# Patient Record
Sex: Male | Born: 1990 | Race: Black or African American | Hispanic: No | Marital: Single | Smoking: Current every day smoker
Health system: Southern US, Community
[De-identification: ages and names within clinical notes are randomized; demographics above are authoritative.]

## PROBLEM LIST (undated history)

## (undated) DIAGNOSIS — A4902 Methicillin resistant Staphylococcus aureus infection, unspecified site: Secondary | ICD-10-CM

## (undated) DIAGNOSIS — F1910 Other psychoactive substance abuse, uncomplicated: Secondary | ICD-10-CM

## (undated) DIAGNOSIS — S31119A Laceration without foreign body of abdominal wall, unspecified quadrant without penetration into peritoneal cavity, initial encounter: Secondary | ICD-10-CM

## (undated) HISTORY — PX: ABDOMINAL SURGERY: SHX537

---

## 2019-11-17 ENCOUNTER — Encounter (HOSPITAL_COMMUNITY): Payer: Self-pay | Admitting: Emergency Medicine

## 2019-11-17 ENCOUNTER — Inpatient Hospital Stay (HOSPITAL_COMMUNITY): Payer: Self-pay

## 2019-11-17 ENCOUNTER — Inpatient Hospital Stay (HOSPITAL_COMMUNITY)
Admission: EM | Admit: 2019-11-17 | Discharge: 2019-11-25 | DRG: 872 | Disposition: A | Discharge status: Home / Self Care | Payer: Self-pay | Attending: Internal Medicine | Admitting: Internal Medicine

## 2019-11-17 ENCOUNTER — Emergency Department (HOSPITAL_COMMUNITY): Payer: Self-pay

## 2019-11-17 ENCOUNTER — Other Ambulatory Visit: Payer: Self-pay

## 2019-11-17 DIAGNOSIS — F17200 Nicotine dependence, unspecified, uncomplicated: Secondary | ICD-10-CM | POA: Diagnosis present

## 2019-11-17 DIAGNOSIS — E871 Hypo-osmolality and hyponatremia: Secondary | ICD-10-CM | POA: Diagnosis present

## 2019-11-17 DIAGNOSIS — Z20822 Contact with and (suspected) exposure to covid-19: Secondary | ICD-10-CM | POA: Diagnosis present

## 2019-11-17 DIAGNOSIS — M5431 Sciatica, right side: Secondary | ICD-10-CM | POA: Diagnosis present

## 2019-11-17 DIAGNOSIS — M5432 Sciatica, left side: Secondary | ICD-10-CM

## 2019-11-17 DIAGNOSIS — M5442 Lumbago with sciatica, left side: Secondary | ICD-10-CM

## 2019-11-17 DIAGNOSIS — R079 Chest pain, unspecified: Secondary | ICD-10-CM

## 2019-11-17 DIAGNOSIS — Z720 Tobacco use: Secondary | ICD-10-CM

## 2019-11-17 DIAGNOSIS — E876 Hypokalemia: Secondary | ICD-10-CM | POA: Diagnosis present

## 2019-11-17 DIAGNOSIS — E872 Acidosis, unspecified: Secondary | ICD-10-CM

## 2019-11-17 DIAGNOSIS — A419 Sepsis, unspecified organism: Secondary | ICD-10-CM

## 2019-11-17 DIAGNOSIS — R7881 Bacteremia: Secondary | ICD-10-CM

## 2019-11-17 DIAGNOSIS — F419 Anxiety disorder, unspecified: Secondary | ICD-10-CM | POA: Diagnosis present

## 2019-11-17 DIAGNOSIS — F1910 Other psychoactive substance abuse, uncomplicated: Secondary | ICD-10-CM

## 2019-11-17 DIAGNOSIS — A4102 Sepsis due to Methicillin resistant Staphylococcus aureus: Principal | ICD-10-CM | POA: Diagnosis present

## 2019-11-17 HISTORY — DX: Laceration without foreign body of abdominal wall, unspecified quadrant without penetration into peritoneal cavity, initial encounter: S31.119A

## 2019-11-17 LAB — CBC WITH DIFFERENTIAL/PLATELET
Abs Immature Granulocytes: 0.12 10*3/uL — ABNORMAL HIGH (ref 0.00–0.07)
Basophils Absolute: 0 10*3/uL (ref 0.0–0.1)
Basophils Relative: 0 %
Eosinophils Absolute: 0.2 10*3/uL (ref 0.0–0.5)
Eosinophils Relative: 1 %
HCT: 34.5 % — ABNORMAL LOW (ref 39.0–52.0)
Hemoglobin: 11.5 g/dL — ABNORMAL LOW (ref 13.0–17.0)
Immature Granulocytes: 1 %
Lymphocytes Relative: 6 %
Lymphs Abs: 1.1 10*3/uL (ref 0.7–4.0)
MCH: 27.4 pg (ref 26.0–34.0)
MCHC: 33.3 g/dL (ref 30.0–36.0)
MCV: 82.1 fL (ref 80.0–100.0)
Monocytes Absolute: 1.1 10*3/uL — ABNORMAL HIGH (ref 0.1–1.0)
Monocytes Relative: 6 %
Neutro Abs: 16.3 10*3/uL — ABNORMAL HIGH (ref 1.7–7.7)
Neutrophils Relative %: 86 %
Platelets: 235 10*3/uL (ref 150–400)
RBC: 4.2 MIL/uL — ABNORMAL LOW (ref 4.22–5.81)
RDW: 13.6 % (ref 11.5–15.5)
WBC: 18.9 10*3/uL — ABNORMAL HIGH (ref 4.0–10.5)
nRBC: 0 % (ref 0.0–0.2)

## 2019-11-17 LAB — COMPREHENSIVE METABOLIC PANEL
ALT: 12 U/L (ref 0–44)
AST: 21 U/L (ref 15–41)
Albumin: 3.7 g/dL (ref 3.5–5.0)
Alkaline Phosphatase: 107 U/L (ref 38–126)
Anion gap: 11 (ref 5–15)
BUN: 10 mg/dL (ref 6–20)
CO2: 22 mmol/L (ref 22–32)
Calcium: 8.8 mg/dL — ABNORMAL LOW (ref 8.9–10.3)
Chloride: 98 mmol/L (ref 98–111)
Creatinine, Ser: 1.1 mg/dL (ref 0.61–1.24)
GFR calc Af Amer: >60 mL/min (ref >60)
GFR calc non Af Amer: >60 mL/min (ref >60)
Glucose, Bld: 114 mg/dL — ABNORMAL HIGH (ref 70–99)
Potassium: 3.2 mmol/L — ABNORMAL LOW (ref 3.5–5.1)
Sodium: 131 mmol/L — ABNORMAL LOW (ref 135–145)
Total Bilirubin: 0.4 mg/dL (ref 0.3–1.2)
Total Protein: 7.6 g/dL (ref 6.5–8.1)

## 2019-11-17 LAB — BLOOD CULTURE ID PANEL (REFLEXED)

## 2019-11-17 LAB — PROTIME-INR
INR: 1.3 — ABNORMAL HIGH (ref 0.8–1.2)
Prothrombin Time: 15.3 seconds — ABNORMAL HIGH (ref 11.4–15.2)

## 2019-11-17 LAB — URINALYSIS, ROUTINE W REFLEX MICROSCOPIC
Bilirubin Urine: NEGATIVE
Glucose, UA: 50 mg/dL — AB
Hgb urine dipstick: NEGATIVE
Ketones, ur: NEGATIVE mg/dL
Leukocytes,Ua: NEGATIVE
Nitrite: NEGATIVE
Protein, ur: NEGATIVE mg/dL
Specific Gravity, Urine: 1.026 (ref 1.005–1.030)
pH: 6 (ref 5.0–8.0)

## 2019-11-17 LAB — SEDIMENTATION RATE: Sed Rate: 40 mm/hr — ABNORMAL HIGH (ref 0–16)

## 2019-11-17 LAB — APTT: aPTT: 27 seconds (ref 24–36)

## 2019-11-17 LAB — LACTIC ACID, PLASMA
Lactic Acid, Venous: 2.7 mmol/L (ref 0.5–1.9)
Lactic Acid, Venous: 1.7 mmol/L (ref 0.5–1.9)

## 2019-11-17 LAB — C-REACTIVE PROTEIN: CRP: 13.8 mg/dL — ABNORMAL HIGH (ref <1.0)

## 2019-11-17 LAB — HIV ANTIBODY (ROUTINE TESTING W REFLEX): HIV Screen 4th Generation wRfx: NONREACTIVE

## 2019-11-17 LAB — SARS CORONAVIRUS 2 BY RT PCR (HOSPITAL ORDER, PERFORMED IN ~~LOC~~ HOSPITAL LAB): SARS Coronavirus 2: NEGATIVE

## 2019-11-17 MED ORDER — IBUPROFEN 800 MG PO TABS
800.0000 mg | ORAL_TABLET | Freq: Once | ORAL | Status: AC
  Administered 2019-11-17: 800 mg via ORAL

## 2019-11-17 MED ORDER — METHOCARBAMOL 1000 MG/10ML IJ SOLN
1000.0000 mg | Freq: Once | INTRAVENOUS | Status: AC
  Administered 2019-11-17: 1000 mg via INTRAVENOUS
  Filled 2019-11-17: qty 10

## 2019-11-17 MED ORDER — SODIUM CHLORIDE 0.9 % IV SOLN
2.0000 g | Freq: Once | INTRAVENOUS | Status: AC
  Administered 2019-11-17: 2 g via INTRAVENOUS
  Filled 2019-11-17: qty 2

## 2019-11-17 MED ORDER — ACETAMINOPHEN 650 MG RE SUPP: 650.0000 mg | Freq: Four times a day (QID) | RECTAL | Status: DC | PRN

## 2019-11-17 MED ORDER — METHOCARBAMOL 500 MG PO TABS
500.0000 mg | ORAL_TABLET | Freq: Four times a day (QID) | ORAL | Status: DC | PRN
  Administered 2019-11-17 – 2019-11-25 (×14): 500 mg via ORAL
  Filled 2019-11-17 (×16): qty 1

## 2019-11-17 MED ORDER — ACETAMINOPHEN 325 MG PO TABS
650.0000 mg | ORAL_TABLET | Freq: Once | ORAL | Status: AC
  Administered 2019-11-17: 650 mg via ORAL
  Filled 2019-11-17: qty 2

## 2019-11-17 MED ORDER — ACETAMINOPHEN 325 MG PO TABS
650.0000 mg | ORAL_TABLET | Freq: Four times a day (QID) | ORAL | Status: DC | PRN
  Administered 2019-11-17 – 2019-11-20 (×7): 650 mg via ORAL
  Filled 2019-11-17 (×7): qty 2

## 2019-11-17 MED ORDER — SODIUM CHLORIDE 0.9 % IV SOLN
2.0000 g | Freq: Three times a day (TID) | INTRAVENOUS | Status: DC
  Administered 2019-11-17: 2 g via INTRAVENOUS
  Filled 2019-11-17 (×2): qty 2

## 2019-11-17 MED ORDER — SODIUM CHLORIDE 0.9 % IV SOLN: INTRAVENOUS | Status: DC

## 2019-11-17 MED ORDER — VANCOMYCIN HCL IN DEXTROSE 1-5 GM/200ML-% IV SOLN: 1000.0000 mg | Freq: Once | INTRAVENOUS | Status: DC

## 2019-11-17 MED ORDER — LACTATED RINGERS IV BOLUS (SEPSIS)
1000.0000 mL | Freq: Once | INTRAVENOUS | Status: AC
  Administered 2019-11-17: 1000 mL via INTRAVENOUS

## 2019-11-17 MED ORDER — METHYLPREDNISOLONE SODIUM SUCC 125 MG IJ SOLR
125.0000 mg | Freq: Once | INTRAMUSCULAR | Status: AC
  Administered 2019-11-17: 125 mg via INTRAVENOUS
  Filled 2019-11-17: qty 2

## 2019-11-17 MED ORDER — METRONIDAZOLE IN NACL 5-0.79 MG/ML-% IV SOLN
500.0000 mg | Freq: Once | INTRAVENOUS | Status: AC
  Administered 2019-11-17: 500 mg via INTRAVENOUS
  Filled 2019-11-17: qty 100

## 2019-11-17 MED ORDER — KETOROLAC TROMETHAMINE 30 MG/ML IJ SOLN
30.0000 mg | Freq: Four times a day (QID) | INTRAMUSCULAR | Status: AC | PRN
  Administered 2019-11-17 – 2019-11-22 (×13): 30 mg via INTRAVENOUS
  Filled 2019-11-17 (×14): qty 1

## 2019-11-17 MED ORDER — IOHEXOL 300 MG/ML  SOLN
100.0000 mL | Freq: Once | INTRAMUSCULAR | Status: AC | PRN
  Administered 2019-11-17: 100 mL via INTRAVENOUS

## 2019-11-17 MED ORDER — POTASSIUM CHLORIDE CRYS ER 20 MEQ PO TBCR
40.0000 meq | EXTENDED_RELEASE_TABLET | Freq: Once | ORAL | Status: AC
  Administered 2019-11-17: 40 meq via ORAL
  Filled 2019-11-17: qty 2

## 2019-11-17 MED ORDER — LACTATED RINGERS IV BOLUS (SEPSIS)
500.0000 mL | Freq: Once | INTRAVENOUS | Status: AC
  Administered 2019-11-17: 500 mL via INTRAVENOUS

## 2019-11-17 MED ORDER — ENOXAPARIN SODIUM 40 MG/0.4ML ~~LOC~~ SOLN
40.0000 mg | SUBCUTANEOUS | Status: DC
  Administered 2019-11-17 – 2019-11-25 (×8): 40 mg via SUBCUTANEOUS
  Filled 2019-11-17 (×8): qty 0.4

## 2019-11-17 MED ORDER — VANCOMYCIN HCL 1500 MG/300ML IV SOLN
1500.0000 mg | Freq: Once | INTRAVENOUS | Status: AC
  Administered 2019-11-17: 1500 mg via INTRAVENOUS
  Filled 2019-11-17: qty 300

## 2019-11-17 MED ORDER — METHOCARBAMOL 1000 MG/10ML IJ SOLN
INTRAMUSCULAR | Status: AC
  Filled 2019-11-17: qty 10

## 2019-11-17 MED ORDER — VANCOMYCIN HCL IN DEXTROSE 1-5 GM/200ML-% IV SOLN
1000.0000 mg | Freq: Three times a day (TID) | INTRAVENOUS | Status: DC
  Administered 2019-11-17 – 2019-11-20 (×9): 1000 mg via INTRAVENOUS
  Filled 2019-11-17 (×10): qty 200
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
## 2019-11-17 NOTE — ED Notes (Addendum)
Charis, OT, states she was unable to complete full assessment on pt due to pt lethargy at this time. States pt will be reevaluated inpatient.

## 2019-11-17 NOTE — ED Notes (Signed)
Pt ambulating independently upon entering room.

## 2019-11-17 NOTE — ED Notes (Signed)
Date and time results received: 11/17/19 4:14 PM  (use smartphrase ".now" to insert current time)  Test: gram positive cocci blood cultures Critical Value: positive   Name of Provider Notified: Dyann Kief MD  Orders Received? Or Actions Taken?: na

## 2019-11-17 NOTE — Progress Notes (Signed)
Pharmacy Antibiotic Note  Jordan [name] is a 29 y.o. male admitted on 11/17/2019 with sepsis.  Pharmacy has been consulted for Vancomycin and Cefepime dosing.  Plan: Cefepime 2gm IV q8h Vancomycin 1500mg  IV now then  1000 mg IV Q 8 hrs. Will f/u renal function, micro data, and pt's clinical condition Vanc levels prn   Height: 5\' 10"  (177.8 cm) Weight: 77.1 kg (170 lb) IBW/kg (Calculated) : 73  Temp (24hrs), Avg:100.8 F (38.2 C), Min:98.8 F (37.1 C), Max:102.7 F (39.3 C)  No results for input(s): WBC, CREATININE, LATICACIDVEN, VANCOTROUGH, VANCOPEAK, VANCORANDOM, GENTTROUGH, GENTPEAK, GENTRANDOM, TOBRATROUGH, TOBRAPEAK, TOBRARND, AMIKACINPEAK, AMIKACINTROU, AMIKACIN in the last 168 hours.  CrCl cannot be calculated (No successful lab value found.).    Not on File  Antimicrobials this admission: 7/19 Vanc >>  7/19 Cefepime >>  7/19 Flagyl x 1  Microbiology results:  BCx:   UCx:     Thank you for allowing pharmacy to be a part of this patient's care.  Sherlon Handing, PharmD, BCPS Please see amion for complete clinical pharmacist phone list 11/17/2019 3:59 AM

## 2019-11-17 NOTE — ED Notes (Signed)
Pt back from CT

## 2019-11-17 NOTE — Progress Notes (Signed)
Patient seen and examined.  Admitted after midnight secondary to progressive and debilitating lower back pain with sciatica; fever, tachycardia, elevated lactic acid and WBCs, with concerns for for sepsis.  Patient with underlying history of IV recreational drugs usage (heroin).  Please refer to H&P written by Dr. Josephine Cables on 11/17/2019 for further info/details on admission.   Plan: -Unable to pursued MRI evaluation due to presence of ankle bracelet. -CT thoracic and lumbar spine with contrast has been ordered to assist with evaluation of SIRS/sepsis. -For now continue fluid resuscitation and empiric antibiotics -Continue as needed analgesics -Follow clinical response.  Barton Dubois MD [phone]

## 2019-11-17 NOTE — Progress Notes (Signed)
Or  29 yo w IVDU hx MRSA bacteremia   Narrow to vancomycin repeat blood cultures no central Lines  TTE for starters

## 2019-11-17 NOTE — ED Notes (Signed)
Pt has black ankle bracelet on left leg. MRI notified of device.

## 2019-11-17 NOTE — ED Triage Notes (Signed)
Pt arrives via [facility]. Pt presents with C/O left leg pain that starts at his buttock and runs behind his knee.

## 2019-11-17 NOTE — ED Notes (Signed)
Pt states he is unable to be transported to MRI in wheelchair due to sciatic pain. Pt mother called out due to patient difficulty getting into bed. 2 person assist from standing position to bed required for pt. Pt states he is unable to bear weight on left leg due to pain.

## 2019-11-17 NOTE — Evaluation (Signed)
Occupational Therapy Evaluation Patient Details Name: Briana Newman MRN: [id number] DOB: [birth year] Today's Date: 11/17/2019    History of Present Illness 29 yo male presenting to ED with severe burning pain in the left buttock with radiation to the left foot. Pt reports difficulty weight bearing on left leg due to severity of pain. Past medical history significant for tobacco abuse and IVDU.    Clinical Impression   PTA, pt was independent, working third shift, and living with his mother. Pt currently presenting with lethargy and drowsiness; difficulty waking from sleep to perform full assessment. During PROM of LLE, pt tolerating ROM at ankle and knee; waking with partial hip ROM but then falling back asleep. Max A for repositioning in bed as pt sleeping with LLE hanging over bed. Mom present and providing PLOF and home set up. Educating mom on possible need for 3N1 at DC to use as toilet riser and shower seat. Pt would benefit from further acute OT to further assess functional performance and provide education for compensatory techniques to decreased pain with ADLs. Recommend dc to home once medically stable per physician.     Follow Up Recommendations  No OT follow up    Equipment Recommendations  3 in 1 bedside commode    Recommendations for Other Services PT consult     Precautions / Restrictions Precautions Precautions: Fall Restrictions Other Position/Activity Restrictions: Pt maintaining NWB at [place] to pain      Mobility Bed Mobility Overal bed mobility: Needs Assistance             General bed mobility comments: Max A to reposition and place LLE onto bed.   Transfers                      Balance                                           ADL either performed or assessed with clinical judgement   ADL Overall ADL's : Needs assistance/impaired                                       General ADL Comments: Pt currently  declining to perform ADLs because of fatigue and drowsiness. Mom reporting that he is in so much pain that he is unable to get dressed or bath. Reports patient wants to bath but unable to participate in task with pain. Mom also reporting pt used her rollator at home to decrease weight bearing at LLE. Educating mom on possible need for 3N1 over toilet (to elevate) and as shower seat. Mom verbalizing understanding.     Vision Baseline Vision/History: No visual deficits       Perception     Praxis      Pertinent Vitals/Pain Pain Assessment: Faces Faces Pain Scale: Hurts little more Pain Location: With movement of LLE Pain Descriptors / Indicators: Grimacing;Discomfort Pain Intervention(s): Monitored during session;Repositioned     Hand Dominance Right   Extremity/Trunk Assessment Upper Extremity Assessment Upper Extremity Assessment: Overall WFL for tasks assessed   Lower Extremity Assessment Lower Extremity Assessment: Defer to PT evaluation   Cervical / Trunk Assessment Cervical / Trunk Assessment: Normal   Communication Communication Communication: No difficulties   Cognition Arousal/Alertness: Lethargic Behavior During Therapy: Flat affect Overall Cognitive  Status: Difficult to assess                                 General Comments: Pt sleeping upon arrival, Waking with movement of LLE but continues to be lethargic. Opening his eyes at one point and saying "hey" and thne "im sorry" in response to his fatgieu and drowsiness   General Comments  Mom present during session.    Exercises Exercises: General Lower Extremity General Exercises - Lower Extremity Ankle Circles/Pumps: PROM;5 reps;Left   Shoulder Instructions      Home Living Family/patient expects to be discharged to:: Private residence Living Arrangements: Parent Available Help at Discharge: Family;Available PRN/intermittently Type of Home: House Home Access: Stairs to enter [employer] of Steps: 1 Entrance Stairs-Rails: None Home Layout: One level     Bathroom Shower/Tub: Tub/shower unit;Curtain   Biochemist, clinical: Standard     Home Equipment: Environmental consultant - 4 wheels (Rollator)          Prior Functioning/Environment Level of Independence: Independent        Comments: Works third shift        OT Problem List: Decreased strength;Decreased range of motion;Decreased activity tolerance;Impaired balance (sitting and/or standing);Decreased knowledge of use of DME or AE;Decreased knowledge of precautions;Pain      OT Treatment/Interventions: Self-care/ADL training;Therapeutic exercise;Energy conservation;DME and/or AE instruction;Therapeutic activities;Patient/family education    OT Goals(Current goals can be found in the care plan section) Acute Rehab OT Goals Patient Stated Goal: Mom "decrease pain and walk again" OT Goal Formulation: With patient/family Time For Goal Achievement: 12/01/19 Potential to Achieve Goals: Good  OT Frequency: Min 2X/week   Barriers to D/C:            Co-evaluation              AM-PAC OT "6 Clicks" Daily Activity     Outcome Measure Help from another person eating meals?: A Little Help from another person taking care of personal grooming?: A Little Help from another person toileting, which includes using toliet, bedpan, or urinal?: A Lot Help from another person bathing (including washing, rinsing, drying)?: A Lot Help from another person to put on and taking off regular upper body clothing?: A Little Help from another person to put on and taking off regular lower body clothing?: A Lot 6 Click Score: 15   End of Session Nurse Communication: Mobility status  Activity Tolerance: Patient limited by fatigue;Patient limited by pain;Patient limited by lethargy Patient left: in bed;with call bell/phone within reach;with family/visitor present  OT Visit Diagnosis: Unsteadiness on feet (R26.81);Other abnormalities  of gait and mobility (R26.89);Muscle weakness (generalized) (M62.81);Pain Pain - Right/Left: Left Pain - part of body: Leg                Time: 3785-8850 OT Time Calculation (min): 15 min Charges:  OT General Charges $OT Visit: 1 Visit OT Evaluation $OT Eval Moderate Complexity: Leonard, OTR/L Acute Rehab Pager: [phone] Office: [place] 11/17/2019, 10:35 AM

## 2019-11-17 NOTE — ED Notes (Signed)
Date and time results received: 11/17/19  0508  Test: Lactic Acid Critical Value: 2.7  Name of Provider Notified: Dr. Roxanne Mins  Orders Received? Or Actions Taken?: Actions Taken: n/a

## 2019-11-17 NOTE — ED Notes (Signed)
Pt is requesting something for pain. Pt states the tylenol is not helping. RN notified.

## 2019-11-17 NOTE — Progress Notes (Signed)
CRITICAL VALUE ALERT  Critical Value: Methicillin-resistant , + staph aureus  Date & Time Notied: 2316  Provider Notified: Lang Snow, NP  Orders Received/Actions taken: Aware, pt. Is on vancomyacin.

## 2019-11-18 ENCOUNTER — Encounter (HOSPITAL_COMMUNITY): Payer: Self-pay | Admitting: Internal Medicine

## 2019-11-18 ENCOUNTER — Inpatient Hospital Stay (HOSPITAL_COMMUNITY): Payer: Self-pay

## 2019-11-18 DIAGNOSIS — R7881 Bacteremia: Secondary | ICD-10-CM

## 2019-11-18 DIAGNOSIS — A4102 Sepsis due to Methicillin resistant Staphylococcus aureus: Principal | ICD-10-CM

## 2019-11-18 DIAGNOSIS — B9562 Methicillin resistant Staphylococcus aureus infection as the cause of diseases classified elsewhere: Secondary | ICD-10-CM

## 2019-11-18 LAB — COMPREHENSIVE METABOLIC PANEL
ALT: 14 U/L (ref 0–44)
AST: 16 U/L (ref 15–41)
Albumin: 3.4 g/dL — ABNORMAL LOW (ref 3.5–5.0)
Alkaline Phosphatase: 99 U/L (ref 38–126)
Anion gap: 12 (ref 5–15)
BUN: 11 mg/dL (ref 6–20)
CO2: 24 mmol/L (ref 22–32)
Calcium: 9.1 mg/dL (ref 8.9–10.3)
Chloride: 103 mmol/L (ref 98–111)
Creatinine, Ser: 0.83 mg/dL (ref 0.61–1.24)
GFR calc Af Amer: >60 mL/min (ref >60)
GFR calc non Af Amer: >60 mL/min (ref >60)
Glucose, Bld: 102 mg/dL — ABNORMAL HIGH (ref 70–99)
Potassium: 3.6 mmol/L (ref 3.5–5.1)
Sodium: 139 mmol/L (ref 135–145)
Total Bilirubin: 0.5 mg/dL (ref 0.3–1.2)
Total Protein: 7.5 g/dL (ref 6.5–8.1)

## 2019-11-18 LAB — CBC
HCT: 35.2 % — ABNORMAL LOW (ref 39.0–52.0)
Hemoglobin: 11.2 g/dL — ABNORMAL LOW (ref 13.0–17.0)
MCH: 26.9 pg (ref 26.0–34.0)
MCHC: 31.8 g/dL (ref 30.0–36.0)
MCV: 84.4 fL (ref 80.0–100.0)
Platelets: 258 10*3/uL (ref 150–400)
RBC: 4.17 MIL/uL — ABNORMAL LOW (ref 4.22–5.81)
RDW: 13.7 % (ref 11.5–15.5)
WBC: 26.4 10*3/uL — ABNORMAL HIGH (ref 4.0–10.5)
nRBC: 0 % (ref 0.0–0.2)

## 2019-11-18 LAB — LACTIC ACID, PLASMA
Lactic Acid, Venous: 2.1 mmol/L (ref 0.5–1.9)
Lactic Acid, Venous: 0.8 mmol/L (ref 0.5–1.9)

## 2019-11-18 LAB — PROTIME-INR
INR: 1.2 (ref 0.8–1.2)
Prothrombin Time: 14.6 seconds (ref 11.4–15.2)

## 2019-11-18 LAB — HEPATITIS PANEL, ACUTE
HCV Ab: NONREACTIVE
Hep A IgM: NONREACTIVE
Hep B C IgM: NONREACTIVE
Hepatitis B Surface Ag: NONREACTIVE

## 2019-11-18 LAB — PHOSPHORUS: Phosphorus: 2.6 mg/dL (ref 2.5–4.6)

## 2019-11-18 LAB — ECHOCARDIOGRAM COMPLETE
AR max vel: 3.42 cm2
AV Area VTI: 3.46 cm2
AV Area mean vel: 3.13 cm2
AV Mean grad: 5.2 mmHg
AV Peak grad: 10.1 mmHg
Ao pk vel: 1.59 m/s
Area-P 1/2: 5.06 cm2
Height: 70 in
S' Lateral: 3.74 cm
Weight: 2839.52 oz

## 2019-11-18 LAB — MAGNESIUM: Magnesium: 2.3 mg/dL (ref 1.7–2.4)

## 2019-11-18 LAB — APTT: aPTT: 27 seconds (ref 24–36)

## 2019-11-18 MED ORDER — TEMAZEPAM 7.5 MG PO CAPS
7.5000 mg | ORAL_CAPSULE | Freq: Every evening | ORAL | Status: DC | PRN
  Administered 2019-11-18 – 2019-11-24 (×5): 7.5 mg via ORAL
  Filled 2019-11-18 (×6): qty 1

## 2019-11-18 NOTE — Progress Notes (Signed)
PROGRESS NOTE    Jordan [name]  EVO:[id number] DOB: 1991-01-21 DOA: 11/17/2019 PCP: Patient, No Pcp Per   Chief Complaint  Patient presents with  . Back Pain    Brief Narrative:  As per H&P written by Dr. Josephine Cables on 11/17/2019 Jordan [name] is a 29 y.o. male with medical history significant for tobacco abuse and IVDU who presents to the emergency department due to severe burning pain in the left buttock with radiation to the left foot which started around 8 PM yesterday.  Pain was rated as 10/10 on pain scale.  He complained of difficulty in being able to bear weight on the leg due to severity of the pain, however he denies numbness, tingling, bowel or bladder incontinence.  Patient states that the only position of comfort was kneeling on the floor with his elbows on the bed, however, on arrival at the ED, he was on his feet, though most of his weight was on his right leg.  Last IV heroin use was 3-4 days ago and he injects into antecubital area.  He denies chest pain, shortness of breath, fever, nausea, vomiting or abdominal pain.  ED Course: In the emergency department, initial temperature was normal at 98.51F, but this eventually increased to 127F, he was tachycardic.  Work-up in the ED showed leukocytosis, sodium 131, potassium 3.2, lactic acid 2.7, SARS coronavirus 2 negative.  Chest x-ray showed no active disease.  MRI cervical, thoracic and lumbar spine ordered and pending.  Code sepsis was initiated with IV LR per sepsis protocol and IV Vanco and cefepime ordered.  IV Solu-Medrol 25 Mg x1, ibuprofen and Robaxin were given.  Hospitalist was asked to admit.  For further evaluation and management.  Assessment & Plan: 1-sepsis [facility]) present on admission: In the setting of MRSA bacteremia -Patient with positive history of IV drug use -On presentation was febrile, tachycardic, with elevated WBCs, elevated lactic acid -Lactic acid now resolved after fluid resuscitation -Currently receiving IV  vancomycin -Echo ordered/pending; no other source of bacterial infection appreciated currently. -Patient with ongoing lower back pain left-sided sciatica; with concern for deep tissue infection in his spine.  CT images without acute abnormalities.  Very low threshold based on progression of his symptoms to pursuit MRI images down the road. -appreciate assistance and recommendations by ID service, will follow recommendations.  2-Hypokalemia -Electrolytes have been repleted and currently stable -Will check magnesium level -Continue to follow trend.  3-Left sided sciatica -With acute lower back pain complaints -Currently avoiding the use of opiates -Continue Toradol and Robaxin.  4-hyponatremia/lactic acidosis -Resolved with fluid resuscitation -Advised to maintain adequate hydration -Continue to follow electrolytes trend. -Lactic acid within normal limits currently.  5-Tobacco abuse -Cessation counseling has been provided -Continue nicotine patch.  6-IV drug abuse ([place]) -Cessation counseling provided -Drug of choice is IV heroin -Opiate withdrawal score will be followed while inpatient. -If possible will minimize/avoid the use of narcotics.   DVT prophylaxis: lovenox.  Code Status: Full code. Family Communication: No family at bedside currently. Disposition:   Status is: Inpatient  Dispo: The patient is from: home              Anticipated d/c is to: home (once antibiotic therapy completed)              Anticipated d/c date is: to be determined; might required 4-5 weeks of IV antibiotics.               Patient currently not medically stable for discharge;  patient with active bacteremia secondary to MRSA and requiring IV antibiotic therapy.  Given underlying history of IV drug abuse my in the requiring to be hospitalized to complete monitorized antibiotic therapy.  He continued to express lower back pain, CT images demonstrating no acute abnormalities.  Dependent further  findings on echocardiogram and overall improvement in his status my in the requiring nothing more than just IV antibiotics.  If patient fails to improve or requires removal of ankle bracelet with subsequent MRI examination.       Consultants:   Infectious disease (Dr. Tommy Medal) recommendations given to pursuit echo evaluation and base on results might need TEE. Continue IV vancomycin.   Procedures:  See below for x-ray reports. 2D echo: Pending   Antimicrobials:  Vancomycin 11/17/19  Subjective: Complaining of back pain, no nausea, no vomiting, no chest pain.  Currently afebrile.  Reports no palpitations.  Objective: Vitals:   11/18/19 0520 11/18/19 0600 11/18/19 0900 11/18/19 1333  BP: (!) 156/75   (!) 152/77  Pulse: (!) 121 (!) 118  86  Resp: 16   18  Temp: 98.8 F (37.1 C)   99.5 F (37.5 C)  TempSrc: Oral   Oral  SpO2: 100%  98% 100%  Weight:      Height:        Intake/Output Summary (Last 24 hours) at 11/18/2019 1608 Last data filed at 11/18/2019 1500 Gross per 24 hour  Intake 2314.88 ml  Output 600 ml  Net 1714.88 ml   Filed Weights   11/17/19 0225 11/17/19 2110  Weight: 77.1 kg 80.5 kg    Examination:  General exam: Currently afebrile, reports no chest pain, no nausea, no vomiting.  Complaining of lower back pain and left-sided sciatica.  No shortness of breath. Respiratory system: Clear to auscultation. Respiratory effort normal. Cardiovascular system: Sinus tachycardia, no rubs, no gallops, no murmurs appreciated on exam.  There is no JVD. Gastrointestinal system: Abdomen is nondistended, soft and nontender. No organomegaly or masses felt. Normal bowel sounds heard. Central nervous system: Alert and oriented. No focal neurological deficits. Extremities: Muscle strength 4 out of 5 bilaterally (left more than right decreased appreciation) in the setting of poor effort and pain.  Patient reports no numbness sensation.  There is no cyanosis or clubbing. Skin:  No open wounds; no petechiae.  Multiple tattoos throughout his body appreciated on examination. Psychiatry: Judgement and insight appear normal. Mood & affect appropriate.     Data Reviewed: I have personally reviewed following labs and imaging studies  CBC: Recent Labs  Lab 11/17/19 0417 11/18/19 0500  WBC 18.9* 26.4*  NEUTROABS 16.3*  --   HGB 11.5* 11.2*  HCT 34.5* 35.2*  MCV 82.1 84.4  PLT 235 563    Basic Metabolic Panel: Recent Labs  Lab 11/17/19 0417 11/18/19 0500  NA 131* 139  K 3.2* 3.6  CL 98 103  CO2 22 24  GLUCOSE 114* 102*  BUN 10 11  CREATININE 1.10 0.83  CALCIUM 8.8* 9.1  MG  --  2.3  PHOS  --  2.6    GFR: Estimated Creatinine Clearance: 136.8 mL/min (by C-G formula based on SCr of 0.83 mg/dL).  Liver Function Tests: Recent Labs  Lab 11/17/19 0417 11/18/19 0500  AST 21 16  ALT 12 14  ALKPHOS 107 99  BILITOT 0.4 0.5  PROT 7.6 7.5  ALBUMIN 3.7 3.4*    CBG: No results for input(s): GLUCAP in the last 168 hours.   Recent Results (from  the past 240 hour(s))  SARS Coronavirus 2 by RT PCR (hospital order, performed in [facility] hospital lab) Nasopharyngeal Nasopharyngeal Swab     Status: None   Collection Time: 11/17/19  4:02 AM   Specimen: Nasopharyngeal Swab  Result Value Ref Range Status   SARS Coronavirus 2 NEGATIVE NEGATIVE Final    Comment: (NOTE) SARS-CoV-2 target nucleic acids are NOT DETECTED.  The SARS-CoV-2 RNA is generally detectable in upper and lower respiratory specimens during the acute phase of infection. The lowest concentration of SARS-CoV-2 viral copies this assay can detect is 250 copies / mL. A negative result does not preclude SARS-CoV-2 infection and should not be used as the sole basis for treatment or other patient management decisions.  A negative result may occur with improper specimen collection / handling, submission of specimen other than nasopharyngeal swab, presence of viral mutation(s) within  the areas targeted by this assay, and inadequate number of viral copies (<250 copies / mL). A negative result must be combined with clinical observations, patient history, and epidemiological information.  Fact Sheet for Patients:   StrictlyIdeas.no  Fact Sheet for Healthcare Providers: BankingDealers.co.za  This test is not yet approved or  cleared by the [place] FDA and has been authorized for detection and/or diagnosis of SARS-CoV-2 by FDA under an Emergency Use Authorization (EUA).  This EUA will remain in effect (meaning this test can be used) for the duration of the COVID-19 declaration under Section 564(b)(1) of the Act, 21 U.S.C. section 360bbb-3(b)(1), unless the authorization is terminated or revoked sooner.  Performed at [facility], [address], [place], [place] [address]   Blood Culture (routine x 2)     Status: Abnormal (Preliminary result)   Collection Time: 11/17/19  4:17 AM   Specimen: Right Antecubital; Blood  Result Value Ref Range Status   Specimen Description   Final    RIGHT ANTECUBITAL Performed at [facility], [address], [place], [place] [address]    Special Requests   Final    BOTTLES DRAWN AEROBIC AND ANAEROBIC Blood Culture adequate volume Performed at [facility], [address], [place], [place] [address]    Culture  Setup Time   Final    GRAM POSITIVE COCCI IN BOTH AEROBIC AND ANAEROBIC BOTTLES Gram Stain Report Called to,Read Back By and Verified With: KATYLNNELLIS,RN @1615  11/17/2019 KAY AEROBIC BOTTLE ALSO Performed at [facility], [place] [address] [place], [place] [address]    Culture STAPHYLOCOCCUS AUREUS (A)  Final   Report Status PENDING  Incomplete  Blood Culture (routine x 2)     Status: Abnormal (Preliminary result)   Collection Time: 11/17/19  4:20 AM   Specimen: BLOOD LEFT ARM  Result Value Ref Range Status   Specimen Description   Final    BLOOD LEFT  ARM Performed at [facility], [address] [place], [place] [address]    Special Requests   Final    BOTTLES DRAWN AEROBIC AND ANAEROBIC Blood Culture adequate volume Performed at [facility], [address], [place], [place] [address]    Culture  Setup Time   Final    GRAM POSITIVE COCCI IN BOTH AEROBIC AND ANAEROBIC BOTTLES Gram Stain Report Called to,Read Back By and Verified With: KATYLNN ELLIS,RN @1615   11/17/2019 KAY CRITICAL RESULT CALLED TO, READ BACK BY AND VERIFIED WITH: R DAWAY RN 2236 11/17/19 A BROWNING    Culture (A)  Final    STAPHYLOCOCCUS AUREUS SUSCEPTIBILITIES TO FOLLOW Performed at [facility], [place] [address], [place], [place] [address]    Report Status PENDING  Incomplete  Blood Culture ID Panel (Reflexed)     Status: Abnormal   Collection Time: 11/17/19  4:20 AM  Result Value Ref Range Status   Enterococcus species NOT DETECTED NOT DETECTED Final   Listeria monocytogenes NOT DETECTED NOT DETECTED Final   Staphylococcus species DETECTED (A) NOT DETECTED Final    Comment: CRITICAL RESULT CALLED TO, READ BACK BY AND VERIFIED WITH: R DAWAY RN 2236 11/17/19 A BROWNING    Staphylococcus aureus (BCID) DETECTED (A) NOT DETECTED Final    Comment: Methicillin (oxacillin)-resistant Staphylococcus aureus (MRSA). MRSA is predictably resistant to beta-lactam antibiotics (except ceftaroline). Preferred therapy is vancomycin unless clinically contraindicated. Patient requires contact precautions if  hospitalized. CRITICAL RESULT CALLED TO, READ BACK BY AND VERIFIED WITH: R DAWAY RN 2236 11/17/19 A BROWNING    Methicillin resistance DETECTED (A) NOT DETECTED Final    Comment: CRITICAL RESULT CALLED TO, READ BACK BY AND VERIFIED WITH: R DAWAY RN 2236 11/17/19 A BROWNING    Streptococcus species NOT DETECTED NOT DETECTED Final   Streptococcus agalactiae NOT DETECTED NOT DETECTED Final   Streptococcus pneumoniae NOT DETECTED NOT DETECTED Final   Streptococcus pyogenes  NOT DETECTED NOT DETECTED Final   Acinetobacter baumannii NOT DETECTED NOT DETECTED Final   Enterobacteriaceae species NOT DETECTED NOT DETECTED Final   Enterobacter cloacae complex NOT DETECTED NOT DETECTED Final   Escherichia coli NOT DETECTED NOT DETECTED Final   Klebsiella oxytoca NOT DETECTED NOT DETECTED Final   Klebsiella pneumoniae NOT DETECTED NOT DETECTED Final   Proteus species NOT DETECTED NOT DETECTED Final   Serratia marcescens NOT DETECTED NOT DETECTED Final   Haemophilus influenzae NOT DETECTED NOT DETECTED Final   Neisseria meningitidis NOT DETECTED NOT DETECTED Final   Pseudomonas aeruginosa NOT DETECTED NOT DETECTED Final   Candida albicans NOT DETECTED NOT DETECTED Final   Candida glabrata NOT DETECTED NOT DETECTED Final   Candida krusei NOT DETECTED NOT DETECTED Final   Candida parapsilosis NOT DETECTED NOT DETECTED Final   Candida tropicalis NOT DETECTED NOT DETECTED Final    Comment: Performed at [facility], [place]. [address] [place], [place] [address]    Radiology Studies: CT THORACIC SPINE W CONTRAST  Result Date: 11/17/2019 CLINICAL DATA:  Back pain, history of IV drug use EXAM: CT THORACIC AND LUMBAR SPINE WITH CONTRAST TECHNIQUE: Multidetector CT imaging of the thoracic and lumbar spine was performed with contrast. Multiplanar CT image reconstructions were also generated. COMPARISON:  None. FINDINGS: CT THORACIC SPINE Alignment: Preserved kyphosis. Anteroposterior alignment is maintained. Vertebrae: Vertebral body heights are preserved. No acute fracture. No evidence of endplate erosion or suspicious lesion. Paraspinal and other soft tissues: Unremarkable. Disc levels: Intervertebral disc heights are preserved. There is no degenerative stenosis. CT LUMBAR SPINE Segmentation: 5 lumbar type vertebrae. Alignment: Preserved lordosis. Anteroposterior alignment is maintained. Vertebrae: Vertebral body heights are preserved. There is no acute fracture. No evidence of  endplate erosion or suspicious lesion. Paraspinal and other soft tissues: Unremarkable. Disc levels: Intervertebral disc heights are maintained. There is no degenerative stenosis. IMPRESSION: No significant abnormality. Specifically, no evidence of discitis/osteomyelitis. Electronically Signed   By: Macy Mis M.D.   On: 11/17/2019 11:55   CT LUMBAR SPINE W CONTRAST  Result Date: 11/17/2019 CLINICAL DATA:  Back pain, history of IV drug use EXAM: CT THORACIC AND LUMBAR SPINE WITH CONTRAST TECHNIQUE: Multidetector CT imaging of the thoracic and lumbar spine was  performed with contrast. Multiplanar CT image reconstructions were also generated. COMPARISON:  None. FINDINGS: CT THORACIC SPINE Alignment: Preserved kyphosis. Anteroposterior alignment is maintained. Vertebrae: Vertebral body heights are preserved. No acute fracture. No evidence of endplate erosion or suspicious lesion. Paraspinal and other soft tissues: Unremarkable. Disc levels: Intervertebral disc heights are preserved. There is no degenerative stenosis. CT LUMBAR SPINE Segmentation: 5 lumbar type vertebrae. Alignment: Preserved lordosis. Anteroposterior alignment is maintained. Vertebrae: Vertebral body heights are preserved. There is no acute fracture. No evidence of endplate erosion or suspicious lesion. Paraspinal and other soft tissues: Unremarkable. Disc levels: Intervertebral disc heights are maintained. There is no degenerative stenosis. IMPRESSION: No significant abnormality. Specifically, no evidence of discitis/osteomyelitis. Electronically Signed   By: Macy Mis M.D.   On: 11/17/2019 11:55   DG Chest Port 1 View  Result Date: 11/17/2019 CLINICAL DATA:  Fever. EXAM: PORTABLE CHEST 1 VIEW COMPARISON:  09/19/2019 FINDINGS: The cardiomediastinal silhouette is within normal limits. Mildly coarsened interstitial markings are similar to the prior study. No confluent airspace opacity, edema, pleural effusion, pneumothorax is  identified. No acute osseous abnormality is seen. IMPRESSION: No active disease. Electronically Signed   By: Logan Bores M.D.   On: 11/17/2019 04:49    Scheduled Meds: . enoxaparin (LOVENOX) injection  40 mg Subcutaneous Q24H   Continuous Infusions: . sodium chloride 100 mL/hr at 11/18/19 0641  . vancomycin 1,000 mg (11/18/19 1536)     LOS: 1 day    Time spent: 35 minutes.    Barton Dubois, MD [facility]   To contact the attending provider between 7A-7P or the covering provider during after hours 7P-7A, please log into the web site [email or web address] and access using universal [place] password for that web site. If you do not have the password, please call the hospital operator.  11/18/2019, 4:08 PM

## 2019-11-18 NOTE — Plan of Care (Signed)
  Problem: Clinical Measurements: Goal: Signs and symptoms of infection will decrease Outcome: Progressing   Problem: Respiratory: Goal: Ability to maintain adequate ventilation will improve Outcome: Progressing   Problem: Education: Goal: Knowledge of General Education information will improve Description: Including pain rating scale, medication(s)/side effects and non-pharmacologic comfort measures Outcome: Progressing   Problem: Health Behavior/Discharge Planning: Goal: Ability to manage health-related needs will improve Outcome: Progressing   Problem: Clinical Measurements: Goal: Ability to maintain clinical measurements within normal limits will improve Outcome: Progressing Goal: Will remain free from infection Outcome: Progressing Goal: Diagnostic test results will improve Outcome: Progressing Goal: Respiratory complications will improve Outcome: Progressing   Problem: Activity: Goal: Risk for activity intolerance will decrease Outcome: Progressing   Problem: Coping: Goal: Level of anxiety will decrease Outcome: Progressing   Problem: Elimination: Goal: Will not experience complications related to bowel motility Outcome: Progressing Goal: Will not experience complications related to urinary retention Outcome: Progressing   Problem: Pain Managment: Goal: General experience of comfort will improve Outcome: Progressing   Problem: Safety: Goal: Ability to remain free from injury will improve Outcome: Progressing   Problem: Skin Integrity: Goal: Risk for impaired skin integrity will decrease Outcome: Progressing

## 2019-11-18 NOTE — Progress Notes (Signed)
   11/18/19 0520  Assess: MEWS Score  Temp 98.8 F (37.1 C)  BP (!) 156/75  Pulse Rate (!) 121  Resp 16  SpO2 100 %  O2 Device Room Air  Assess: MEWS Score  MEWS Temp 0  MEWS Systolic 0  MEWS Pulse 2  MEWS RR 0  MEWS LOC 0  MEWS Score 2  MEWS Score Color Yellow  Assess: if the MEWS score is Yellow or Red  Were vital signs taken at a resting state? Yes  Focused Assessment No change from prior assessment  Early Detection of Sepsis Score *See Row Information* Low  MEWS guidelines implemented *See Row Information* No, previously yellow, continue vital signs every 4 hours  Treat  MEWS Interventions Other (Comment)  Pain Scale 0-10  Pain Score 4  Pain Type Acute pain  Pain Location Leg  Escalate  MEWS: Escalate Yellow: discuss with charge nurse/RN and consider discussing with provider and RRT  Notify: Charge Nurse/RN  Name of Charge Nurse/RN Notified Marcy Siren  Date Charge Nurse/RN Notified 11/18/19  Time Charge Nurse/RN Notified 0520  Notify: Provider  Provider Name/Title Dr.Adefeso  Date Provider Notified 11/18/19  Time Provider Notified [phone]  Notification Type Call  Notification Reason Other (Comment) (HR 120's-130's)  Response No new orders (continue monitoring the pt.)  Date of Provider Response 11/18/19  Time of Provider Response [phone]  Document  Patient Outcome Other (Comment) (pt. remains stable/ no changes from initial assessment )

## 2019-11-18 NOTE — TOC Initial Note (Signed)
Transition of Care [facility]) - Initial/Assessment Note    Patient Details  Name: Jordan [name] MRN: [id number] Date of Birth: 1990-09-04  Transition of Care [facility]) CM/SW Contact:    Boneta Lucks, RN Phone Number: 11/18/2019, 3:02 PM  Clinical Narrative:     Patient admitted with sepsis, hx of  IV drug use. States he used the day before coming into the ED. Patient lives at home with his mother, currently wear house arrest ankle bracelet. Patient does not have insurance. Will be in need of IV antibiotics. Patient does have the support of his mother and is able to drive if needed to get daily IV antibiotics. Patient discussed on LOS call for suggestions. TOC spoke with Pam at [facility] they do offer charity, but patient has to be drug free for 6 months. TOC to follow.                Barriers to Discharge: Other (comment) (no insurance)   Patient Goals and CMS Choice   CMS Medicare.gov Compare Post Acute Care list provided to:: Patient    Expected Discharge Plan and Services      Living arrangements for the past 2 months: Single Family Home                  Prior Living Arrangements/Services Living arrangements for the past 2 months: Single Family Home Lives with:: Parents          Need for Family Participation in Patient Care: Yes (Comment) Care giver support system in place?: Yes (comment)   Criminal Activity/Legal Involvement Pertinent to Current Situation/Hospitalization: Yes - Comment as needed (Ankle bracelet/ house arrest)  Activities of Daily Living Home Assistive Devices/Equipment: None ADL Screening (condition at time of admission) Patient's cognitive ability adequate to safely complete daily activities?: Yes Is the patient deaf or have difficulty hearing?: No Does the patient have difficulty seeing, even when wearing glasses/contacts?: No Does the patient have difficulty concentrating, remembering, or making decisions?: No Patient able to express need for  assistance with ADLs?: Yes Does the patient have difficulty dressing or bathing?: No Independently performs ADLs?: Yes (appropriate for developmental age) Does the patient have difficulty walking or climbing stairs?: No Weakness of Legs: None Weakness of Arms/Hands: None  Permission Sought/Granted   Emotional Assessment     Affect (typically observed): Accepting Orientation: : Oriented to Self, Oriented to Place, Oriented to  Time, Oriented to Situation Alcohol / Substance Use: Illicit Drugs Psych Involvement: No (comment)  Admission diagnosis:  Hypokalemia [E87.6] Hyponatremia [E87.1] Sepsis ([place]) [A41.9] Sepsis due to undetermined organism ([place]) [A41.9] Sciatica, right side [M54.31] Patient Active Problem List   Diagnosis Date Noted   Sepsis ([place]) 11/17/2019   Hypokalemia 11/17/2019   Left sided sciatica 11/17/2019   Lactic acidosis 11/17/2019   Hyponatremia 11/17/2019   Tobacco abuse 11/17/2019   IV drug abuse ([place]) 11/17/2019   PCP:  Patient, No Pcp Per Pharmacy:   [facility] [place] [address] [place] [address] Phone: [phone] Fax: [place], [address] [place] [address] Phone: [phone] Fax: [phone]   Readmission Risk Interventions Readmission Risk Prevention Plan 11/18/2019  Transportation Screening Complete

## 2019-11-18 NOTE — Progress Notes (Addendum)
CRITICAL VALUE ALERT  Critical Value: lactic 2.1,   reported by Rolan Lipa from the LAB.   Date & Time Notied:  11/18/2019 0630  Provider Notified: Dr Josephine Cables, Notified on 11/18/2019 @0638   Orders Received/Actions taken: Increase NSS rate from 75 to 100 cc/hr

## 2019-11-18 NOTE — Progress Notes (Signed)
*  PRELIMINARY RESULTS* Echocardiogram 2D Echocardiogram has been performed.  Jordan [name] 11/18/2019, 3:39 PM

## 2019-11-18 NOTE — Plan of Care (Signed)
°  Problem: Acute Rehab PT Goals(only PT should resolve) Goal: Patient Will Transfer Sit To/From Stand Outcome: Progressing Flowsheets (Taken 11/18/2019 1222) Patient will transfer sit to/from stand: with modified independence Goal: Pt Will Transfer Bed To Chair/Chair To Bed Outcome: Progressing Flowsheets (Taken 11/18/2019 1222) Pt will Transfer Bed to Chair/Chair to Bed: with modified independence Goal: Pt Will Ambulate Outcome: Progressing Flowsheets (Taken 11/18/2019 1222) Pt will Ambulate:  > 125 feet  with modified independence  with rolling walker  with crutches   12:22 PM, 11/18/19 Lonell Grandchild, MPT Physical Therapist with [facility] [phone] office [phone] mobile phone

## 2019-11-18 NOTE — Evaluation (Signed)
Physical Therapy Evaluation Patient Details Name: Jordan [name] MRN: [id number] DOB: [birth year] Today's Date: 11/18/2019   History of Present Illness  29 yo male presenting to ED with severe burning pain in the left buttock with radiation to the left foot. Pt reports difficulty weight bearing on left leg due to severity of pain. Past medical history significant for tobacco abuse and IVDU.     Clinical Impression  Patient demonstrates slow labored movement for sitting up at bedside, poor tolerance for weight bearing on LLE requiring verbal cues to attempt taking normal steps on left foot with fair carryover, and tends to to ambulate with swing through pattern using RW.  Patient instructed in piriformis stretching to left buttock with mild relief in discomfort noted and encouraged to daily as tolerated.  Patient will benefit from continued physical therapy in hospital and recommended venue below to increase strength, balance, endurance for safe ADLs and gait.     Follow Up Recommendations Outpatient PT    Equipment Recommendations  Rolling walker with 5" wheels    Recommendations for Other Services       Precautions / Restrictions Precautions Precautions: Fall Restrictions Weight Bearing Restrictions: No      Mobility  Bed Mobility Overal bed mobility: Needs Assistance Bed Mobility: Supine to Sit;Sit to Supine     Supine to sit: Supervision Sit to supine: Supervision   General bed mobility comments: increased time, labored movement  Transfers Overall transfer level: Needs assistance Equipment used: Rolling walker (2 wheeled);None Transfers: Sit to/from [employer] to Stand: Supervision Stand pivot transfers: Supervision       General transfer comment: unsteady on feet with limited weightbearing on LLE due to increased left hip and ankle pain, had to use RW for safety  Ambulation/Gait Ambulation/Gait assistance: Supervision Gait Distance (Feet): 75  Feet Assistive device: Rolling walker (2 wheeled) Gait Pattern/deviations: Decreased step length - left;Decreased stance time - left;Decreased stride length;Antalgic Gait velocity: decreased   General Gait Details: tends to use RW for 2 point gait pattern swinging BLE with NWB on LLE, required verbal cues to attempt normal steps on left foot with fair carryover, but had to slow cadence due to increased left hip, ankle pain  Stairs            Wheelchair Mobility    Modified Rankin (Stroke Patients Only)       Balance Overall balance assessment: Needs assistance Sitting-balance support: Feet supported;No upper extremity supported Sitting balance-Leahy Scale: Good Sitting balance - Comments: seated at EOB   Standing balance support: During functional activity;No upper extremity supported Standing balance-Leahy Scale: Poor Standing balance comment: fair/poor without AD, fair/good using RW                             Pertinent Vitals/Pain Pain Assessment: 0-10 Pain Score: 7  Pain Location: left hip, ankle with movement Pain Descriptors / Indicators: Grimacing;Discomfort Pain Intervention(s): Limited activity within patient's tolerance;Monitored during session;Repositioned    Home Living Family/patient expects to be discharged to:: Private residence Living Arrangements: Parent Available Help at Discharge: Family;Available PRN/intermittently Type of Home: House Home Access: Stairs to enter Entrance Stairs-Rails: None Entrance Stairs-Number of Steps: 1 Home Layout: One level Home Equipment: Walker - 4 wheels      Prior Function Level of Independence: Independent         Comments: Hydrographic surveyor, drives, works     Journalist, newspaper   Dominant Hand: Right  Extremity/Trunk Assessment   Upper Extremity Assessment Upper Extremity Assessment: Defer to OT evaluation    Lower Extremity Assessment Lower Extremity Assessment: Overall WFL for tasks  assessed;LLE deficits/detail LLE Deficits / Details: grossly -4/5 LLE: Unable to fully assess due to pain LLE Sensation: WNL LLE Coordination: WNL    Cervical / Trunk Assessment Cervical / Trunk Assessment: Normal  Communication   Communication: No difficulties  Cognition Arousal/Alertness: Awake/alert Behavior During Therapy: WFL for tasks assessed/performed Overall Cognitive Status: Within Functional Limits for tasks assessed                                        General Comments      Exercises Other Exercises Other Exercises: piriformis stretching x 30 second hold   Assessment/Plan    PT Assessment Patient needs continued PT services  PT Problem List Decreased strength;Decreased activity tolerance;Decreased balance;Decreased mobility       PT Treatment Interventions Balance training;Gait training;Stair training;Functional mobility training;Therapeutic activities;Therapeutic exercise;Patient/family education    PT Goals (Current goals can be found in the Care Plan section)  Acute Rehab PT Goals Patient Stated Goal: walk without LLE pain PT Goal Formulation: With patient Time For Goal Achievement: 11/25/19 Potential to Achieve Goals: Good    Frequency Min 3X/week   Barriers to discharge        Co-evaluation               AM-PAC PT "6 Clicks" Mobility  Outcome Measure Help needed turning from your back to your side while in a flat bed without using bedrails?: None Help needed moving from lying on your back to sitting on the side of a flat bed without using bedrails?: None Help needed moving to and from a bed to a chair (including a wheelchair)?: A Little Help needed standing up from a chair using your arms (e.g., wheelchair or bedside chair)?: A Little Help needed to walk in hospital room?: A Little Help needed climbing 3-5 steps with a railing? : A Lot 6 Click Score: 19    End of Session   Activity Tolerance: Patient tolerated  treatment well;Patient limited by pain Patient left: in bed;with call bell/phone within reach Nurse Communication: Mobility status PT Visit Diagnosis: Unsteadiness on feet (R26.81);Other abnormalities of gait and mobility (R26.89);Muscle weakness (generalized) (M62.81)    Time: 4536-4680 PT Time Calculation (min) (ACUTE ONLY): 31 min   Charges:     PT Treatments $Therapeutic Activity: 23-37 mins        12:17 PM, 11/18/19 Lonell Grandchild, MPT Physical Therapist with [facility] [phone] office [phone] mobile phone

## 2019-11-18 NOTE — Progress Notes (Signed)
      [facility] Antimicrobial Management Team Staphylococcus aureus bacteremia   Staphylococcus aureus bacteremia (SAB) is associated with a high rate of complications and mortality.  Specific aspects of clinical management are critical to optimizing the outcome of patients with SAB.  Therefore, the [facility] Antimicrobial Management Team [facility]) has initiated an intervention aimed at improving the management of SAB at [facility].  To do so, Infectious Diseases physicians are providing an evidence-based consult for the management of all patients with SAB.     Yes No Comments  Perform follow-up blood cultures (even if the patient is afebrile) to ensure clearance of bacteremia [x]  []    Remove vascular catheter and obtain follow-up blood cultures after the removal of the catheter [x]  []  DO NOT place PICC or central line  Perform echocardiography to evaluate for endocarditis (transthoracic ECHO is 40-50% sensitive, TEE is > 90% sensitive) [x]  []  Please keep in mind, that neither test can definitively EXCLUDE endocarditis, and that should clinical suspicion remain high for endocarditis the patient should then still be treated with an "endocarditis" duration of therapy = 6 weeks  Consult electrophysiologist to evaluate implanted cardiac device (pacemaker, ICD) []  []    Ensure source control [x]  []  Have all abscesses been drained effectively? Have deep seeded infections (septic joints or osteomyelitis) had appropriate surgical debridement?  Investigate for "metastatic" sites of infection [x]  []  Does the patient have ANY symptom or physical exam finding that would suggest a deeper infection (back or neck pain that may be suggestive of vertebral osteomyelitis or epidural abscess, muscle pain that could be a symptom of pyomyositis)?  Keep in mind that for deep seeded infections MRI imaging with contrast is preferred rather than other often insensitive tests such as plain x-rays, especially early in a  patient's presentation.  Change antibiotic therapy to vancomycin []  []  Beta-lactam antibiotics are preferred for MSSA due to higher cure rates.   If on Vancomycin, goal trough should be 15 - 20 mcg/mL  Estimated duration of IV antibiotic therapy:  4-6  weeks []  []  Consult case management for probably prolonged outpatient IV antibiotic therapy   29 year old with a history of injection drug use admitted the hospital with low back pain.  Because he had an ankle bracelet them house arrest MRI was not able to be performed.  CT scans of the thoracic and lumbar spine were unrevealing for infection.  Blood cultures on admission however been positive.  Chest x-ray is negative.  I discussed his case with Dr. Dyann Kief I wonder if the patient could have deep infection in the spine that is just not showing up on CT and may not show up on MRI either.  The time being lets continue vancomycin and blood cultures have been repeated.  We will obtain a 2D echocardiogram and if needed a transesophageal echocardiogram.  He should be screened for hepatitis C and B if not already done so

## 2019-11-19 ENCOUNTER — Inpatient Hospital Stay (HOSPITAL_COMMUNITY): Payer: Self-pay

## 2019-11-19 LAB — MAGNESIUM: Magnesium: 1.9 mg/dL (ref 1.7–2.4)

## 2019-11-19 LAB — RAPID URINE DRUG SCREEN, HOSP PERFORMED
Amphetamines: POSITIVE — AB
Barbiturates: NOT DETECTED
Benzodiazepines: POSITIVE — AB
Cocaine: NOT DETECTED
Opiates: POSITIVE — AB
Tetrahydrocannabinol: POSITIVE — AB

## 2019-11-19 LAB — COMPREHENSIVE METABOLIC PANEL
ALT: 14 U/L (ref 0–44)
AST: 14 U/L — ABNORMAL LOW (ref 15–41)
Albumin: 3 g/dL — ABNORMAL LOW (ref 3.5–5.0)
Alkaline Phosphatase: 94 U/L (ref 38–126)
Anion gap: 10 (ref 5–15)
BUN: <5 mg/dL — ABNORMAL LOW (ref 6–20)
CO2: 25 mmol/L (ref 22–32)
Calcium: 8.8 mg/dL — ABNORMAL LOW (ref 8.9–10.3)
Chloride: 104 mmol/L (ref 98–111)
Creatinine, Ser: 0.61 mg/dL (ref 0.61–1.24)
GFR calc Af Amer: >60 mL/min (ref >60)
GFR calc non Af Amer: >60 mL/min (ref >60)
Glucose, Bld: 108 mg/dL — ABNORMAL HIGH (ref 70–99)
Potassium: 3.4 mmol/L — ABNORMAL LOW (ref 3.5–5.1)
Sodium: 139 mmol/L (ref 135–145)
Total Bilirubin: 0.6 mg/dL (ref 0.3–1.2)
Total Protein: 6.7 g/dL (ref 6.5–8.1)

## 2019-11-19 LAB — URINE CULTURE: Culture: NO GROWTH

## 2019-11-19 LAB — CULTURE, BLOOD (ROUTINE X 2)
Special Requests: ADEQUATE
Special Requests: ADEQUATE

## 2019-11-19 MED ORDER — LORAZEPAM 2 MG/ML IJ SOLN
0.0000 mg | Freq: Four times a day (QID) | INTRAMUSCULAR | Status: DC
  Administered 2019-11-19 – 2019-11-20 (×2): 4 mg via INTRAVENOUS
  Filled 2019-11-19: qty 2

## 2019-11-19 MED ORDER — HYDROMORPHONE HCL 1 MG/ML IJ SOLN
1.0000 mg | Freq: Once | INTRAMUSCULAR | Status: AC
  Administered 2019-11-19: 1 mg via INTRAVENOUS
  Filled 2019-11-19: qty 1

## 2019-11-19 MED ORDER — POTASSIUM CHLORIDE CRYS ER 20 MEQ PO TBCR
40.0000 meq | EXTENDED_RELEASE_TABLET | Freq: Once | ORAL | Status: AC
  Administered 2019-11-19: 40 meq via ORAL
  Filled 2019-11-19: qty 2

## 2019-11-19 MED ORDER — THIAMINE HCL 100 MG PO TABS
100.0000 mg | ORAL_TABLET | Freq: Every day | ORAL | Status: DC
  Administered 2019-11-20 – 2019-11-25 (×5): 100 mg via ORAL
  Filled 2019-11-19 (×5): qty 1

## 2019-11-19 MED ORDER — THIAMINE HCL 100 MG/ML IJ SOLN: 100.0000 mg | Freq: Every day | INTRAMUSCULAR | Status: DC

## 2019-11-19 MED ORDER — LORAZEPAM 1 MG PO TABS: 1.0000 mg | ORAL_TABLET | Freq: Every day | ORAL | Status: DC

## 2019-11-19 MED ORDER — LORAZEPAM 2 MG/ML IJ SOLN: 0.0000 mg | Freq: Two times a day (BID) | INTRAMUSCULAR | Status: DC

## 2019-11-19 MED ORDER — TRAMADOL HCL 50 MG PO TABS: 50.0000 mg | ORAL_TABLET | Freq: Two times a day (BID) | ORAL | Status: DC | PRN

## 2019-11-19 MED ORDER — LORAZEPAM 2 MG/ML IJ SOLN
1.0000 mg | INTRAMUSCULAR | Status: DC | PRN
  Administered 2019-11-20: 4 mg via INTRAVENOUS
  Filled 2019-11-19 (×2): qty 2

## 2019-11-19 MED ORDER — ADULT MULTIVITAMIN W/MINERALS CH
1.0000 | ORAL_TABLET | Freq: Every day | ORAL | Status: DC
  Administered 2019-11-22 – 2019-11-25 (×4): 1 via ORAL
  Filled 2019-11-19 (×4): qty 1

## 2019-11-19 MED ORDER — CLONIDINE HCL 0.1 MG PO TABS
0.1000 mg | ORAL_TABLET | Freq: Once | ORAL | Status: AC
  Administered 2019-11-19: 0.1 mg via ORAL
  Filled 2019-11-19: qty 1

## 2019-11-19 MED ORDER — HYDROMORPHONE HCL 1 MG/ML IJ SOLN
0.5000 mg | Freq: Two times a day (BID) | INTRAMUSCULAR | Status: DC | PRN
  Administered 2019-11-19: 0.5 mg via INTRAVENOUS
  Filled 2019-11-19 (×2): qty 1

## 2019-11-19 MED ORDER — FOLIC ACID 1 MG PO TABS
1.0000 mg | ORAL_TABLET | Freq: Every day | ORAL | Status: DC
  Administered 2019-11-20 – 2019-11-25 (×5): 1 mg via ORAL
  Filled 2019-11-19 (×5): qty 1

## 2019-11-19 MED ORDER — LORAZEPAM 1 MG PO TABS: 1.0000 mg | ORAL_TABLET | ORAL | Status: DC | PRN

## 2019-11-19 NOTE — Progress Notes (Addendum)
   11/19/19 0112  Provider Notification  Provider Name/Title Dr Darrick Meigs  Date Provider Notified 11/19/19  Time Provider Notified [phone]  Notification Type Page  Notification Reason  (COWS is equal to 8)  Response Other (Comment) (waiting)    catapres 0.1mg  po  ordered x1

## 2019-11-19 NOTE — Progress Notes (Signed)
   11/19/19 0347  Provider Notification  Provider Name/Title Dr Darrick Meigs  Date Provider Notified 11/19/19  Time Provider Notified [phone]  Notification Type Page  Notification Reason Other (Comment) (increasing agitation and anxiousness, can't stay sit in bed)  Response See new orders (dilaudid 1 mg IV  ordered x1)  Date of Provider Response 11/19/19  Time of Provider Response [phone]

## 2019-11-19 NOTE — Progress Notes (Signed)
Secure chat with Dr. Dyann Kief several times today for C/O pain.  Received new order  For dilaudid Q12 hours which helped briefly. Patient now up and showering.  Brother has been at bedside all afternoon

## 2019-11-19 NOTE — Progress Notes (Signed)
PROGRESS NOTE    Jordan [name]  FFM:[id number] DOB: [birth year] DOA: 11/17/2019 PCP: Patient, No Pcp Per   Chief Complaint  Patient presents with  . Back Pain    Brief Narrative:  As per H&P written by Dr. Josephine Cables on 11/17/2019 Jordan [name] is a 29 y.o. male with medical history significant for tobacco abuse and IVDU who presents to the emergency department due to severe burning pain in the left buttock with radiation to the left foot which started around 8 PM yesterday.  Pain was rated as 10/10 on pain scale.  He complained of difficulty in being able to bear weight on the leg due to severity of the pain, however he denies numbness, tingling, bowel or bladder incontinence.  Patient states that the only position of comfort was kneeling on the floor with his elbows on the bed, however, on arrival at the ED, he was on his feet, though most of his weight was on his right leg.  Last IV heroin use was 3-4 days ago and he injects into antecubital area.  He denies chest pain, shortness of breath, fever, nausea, vomiting or abdominal pain.  ED Course: In the emergency department, initial temperature was normal at 98.46F, but this eventually increased to 127F, he was tachycardic.  Work-up in the ED showed leukocytosis, sodium 131, potassium 3.2, lactic acid 2.7, SARS coronavirus 2 negative.  Chest x-ray showed no active disease.  MRI cervical, thoracic and lumbar spine ordered and pending.  Code sepsis was initiated with IV LR per sepsis protocol and IV Vanco and cefepime ordered.  IV Solu-Medrol 25 Mg x1, ibuprofen and Robaxin were given.  Hospitalist was asked to admit.  For further evaluation and management.  Assessment & Plan: 1-sepsis [facility]) present on admission: In the setting of MRSA bacteremia -Patient with positive history of IV drug use -On presentation was febrile, tachycardic, with elevated WBCs, elevated lactic acid -Lactic acid now resolved after fluid resuscitation -Currently receiving IV  vancomycin -Echo no demonstrating endocarditis or vegetations; will follow ID rec's for needs on TEE. -Patient with ongoing lower back pain left-sided sciatica; with concern for deep tissue infection in his spine.  CT images without acute abnormalities.  Very low threshold based on progression of his symptoms to pursuit MRI images down the road. -appreciate assistance and recommendations by ID service, will follow their guidance in this case.   2-Hypokalemia -Potassium 3.4; continue to follow trend and replete as needed. -Magnesium within normal limits. -Continue to follow trend.  3-Left sided sciatica -With acute lower back pain complaints -Currently avoiding the use of opiates -Continue Toradol and Robaxin.  4-hyponatremia/lactic acidosis -Resolved with fluid resuscitation -Advised to maintain adequate hydration -Continue to follow electrolytes trend. -Lactic acid within normal limits currently.  5-Tobacco abuse -Cessation counseling has been provided -Continue nicotine patch.  6-IV drug abuse ([place]) -Cessation counseling provided -Drug of choice is IV heroin -Opiate withdrawal score will continue to be follow while inpatient. -If possible will minimize/avoid the use of narcotics; low-dose tylosis every 12 hours added with withdrawal and severe pain.   DVT prophylaxis: lovenox.  Code Status: Full code. Family Communication: No family at bedside currently. Disposition:   Status is: Inpatient  Dispo: The patient is from: home              Anticipated d/c is to: home (once antibiotic therapy completed)              Anticipated d/c date is: to be determined; might required 4-5  weeks of IV antibiotics.               Patient currently not medically stable for discharge; patient with active bacteremia secondary to MRSA and requiring IV antibiotic therapy.  Given underlying history of IV drug abuse might ended requiring long term hospitalization for IV therapy. Will follow ID  recommendations. 2-D echo no suggesting endocarditis or vegetations. Continue to follow clinical response.        Consultants:   Infectious disease (Dr. Tommy Medal) recommendations given to pursuit echo evaluation and base on results might need TEE. Continue IV vancomycin.   Procedures:  See below for x-ray reports. 2D echo:  1. Left ventricular ejection fraction, by estimation, is 60 to 65%. The  left ventricle has normal function. The left ventricle has no regional  wall motion abnormalities. There is mild left ventricular hypertrophy.  Left ventricular diastolic parameters  were normal.  2. Right ventricular systolic function is normal. The right ventricular  size is normal.  3. Left atrial size was mildly dilated.  4. The mitral valve is normal in structure. No evidence of mitral valve  regurgitation. No evidence of mitral stenosis.  5. The aortic valve is tricuspid. Aortic valve regurgitation is not  visualized. No aortic stenosis is present.  6. The inferior vena cava is normal in size with greater than 50%  respiratory variability, suggesting right atrial pressure of 3 mmHg.    Antimicrobials:  Vancomycin 11/17/19  Subjective: No fever, no chest pain, no nausea, no vomiting.  Continue complaining of intermittent severe left lower back pain.  So far tolerating IV antibiotics.  Objective: Vitals:   11/18/19 2152 11/19/19 0500 11/19/19 0835 11/19/19 1200  BP:  126/71 129/87 129/78  Pulse: (!) 108 75 80 79  Resp:  18 16 16   Temp:  99.1 F (37.3 C) 98.7 F (37.1 C) 98.6 F (37 C)  TempSrc:  Oral Oral Oral  SpO2:  99% 100% 100%  Weight:      Height:        Intake/Output Summary (Last 24 hours) at 11/19/2019 1419 Last data filed at 11/19/2019 0840 Gross per 24 hour  Intake 2330.05 ml  Output 400 ml  Net 1930.05 ml   Filed Weights   11/17/19 0225 11/17/19 2110  Weight: 77.1 kg 80.5 kg    Examination: General exam: Alert, awake, oriented x 3, no chest  pain, no nausea, no vomiting.  Continue complaining of lower back pain and left sided sciatica.  Currently no fever.  Overnight with increase COWS score requiring one-time dose of Dilaudid and clonidine. Respiratory system: Clear to auscultation. Respiratory effort normal. Cardiovascular system:RRR. No murmurs, rubs, gallops.  No JVD. Gastrointestinal system: Abdomen is nondistended, soft and nontender. No organomegaly or masses felt. Normal bowel sounds heard. Central nervous system: Alert and oriented. No focal neurological deficits. Extremities: No C/C/E, +pedal pulses Skin: No rashes, no petechiae.  Multiple tattoos appreciated throughout his body on exam. Psychiatry: Judgement and insight appear normal. Mood & affect appropriate.    Data Reviewed: I have personally reviewed following labs and imaging studies  CBC: Recent Labs  Lab 11/17/19 0417 11/18/19 0500  WBC 18.9* 26.4*  NEUTROABS 16.3*  --   HGB 11.5* 11.2*  HCT 34.5* 35.2*  MCV 82.1 84.4  PLT 235 413    Basic Metabolic Panel: Recent Labs  Lab 11/17/19 0417 11/18/19 0500 11/19/19 0543  NA 131* 139 139  K 3.2* 3.6 3.4*  CL 98 103 104  CO2  22 24 25   GLUCOSE 114* 102* 108*  BUN 10 11 <5*  CREATININE 1.10 0.83 0.61  CALCIUM 8.8* 9.1 8.8*  MG  --  2.3 1.9  PHOS  --  2.6  --     GFR: Estimated Creatinine Clearance: 141.9 mL/min (by C-G formula based on SCr of 0.61 mg/dL).  Liver Function Tests: Recent Labs  Lab 11/17/19 0417 11/18/19 0500 11/19/19 0543  AST 21 16 14*  ALT 12 14 14   ALKPHOS 107 99 94  BILITOT 0.4 0.5 0.6  PROT 7.6 7.5 6.7  ALBUMIN 3.7 3.4* 3.0*    CBG: No results for input(s): GLUCAP in the last 168 hours.   Recent Results (from the past 240 hour(s))  SARS Coronavirus 2 by RT PCR (hospital order, performed in [facility] hospital lab) Nasopharyngeal Nasopharyngeal Swab     Status: None   Collection Time: 11/17/19  4:02 AM   Specimen: Nasopharyngeal Swab  Result Value Ref Range  Status   SARS Coronavirus 2 NEGATIVE NEGATIVE Final    Comment: (NOTE) SARS-CoV-2 target nucleic acids are NOT DETECTED.  The SARS-CoV-2 RNA is generally detectable in upper and lower respiratory specimens during the acute phase of infection. The lowest concentration of SARS-CoV-2 viral copies this assay can detect is 250 copies / mL. A negative result does not preclude SARS-CoV-2 infection and should not be used as the sole basis for treatment or other patient management decisions.  A negative result may occur with improper specimen collection / handling, submission of specimen other than nasopharyngeal swab, presence of viral mutation(s) within the areas targeted by this assay, and inadequate number of viral copies (<250 copies / mL). A negative result must be combined with clinical observations, patient history, and epidemiological information.  Fact Sheet for Patients:   StrictlyIdeas.no  Fact Sheet for Healthcare Providers: BankingDealers.co.za  This test is not yet approved or  cleared by the [place] FDA and has been authorized for detection and/or diagnosis of SARS-CoV-2 by FDA under an Emergency Use Authorization (EUA).  This EUA will remain in effect (meaning this test can be used) for the duration of the COVID-19 declaration under Section 564(b)(1) of the Act, 21 U.S.C. section 360bbb-3(b)(1), unless the authorization is terminated or revoked sooner.  Performed at [facility], [address], [place], [place] [address]   Blood Culture (routine x 2)     Status: Abnormal   Collection Time: 11/17/19  4:17 AM   Specimen: Right Antecubital; Blood  Result Value Ref Range Status   Specimen Description   Final    RIGHT ANTECUBITAL Performed at [facility], [address], [place], [place] [address]    Special Requests   Final    BOTTLES DRAWN AEROBIC AND ANAEROBIC Blood Culture adequate volume Performed at [facility], [address], [place], [place] [address]    Culture  Setup Time   Final    GRAM POSITIVE COCCI IN BOTH AEROBIC AND ANAEROBIC BOTTLES Gram Stain Report Called to,Read Back By and Verified With: KATYLNNELLIS,RN @1615  11/17/2019 KAY AEROBIC BOTTLE ALSO    Culture (A)  Final    STAPHYLOCOCCUS AUREUS SUSCEPTIBILITIES PERFORMED ON PREVIOUS CULTURE WITHIN THE LAST 5 DAYS. Performed at [facility], [place] [address], [place], [place] [address]    Report Status 11/19/2019 FINAL  Final  Blood Culture (routine x 2)     Status: Abnormal   Collection Time: 11/17/19  4:20 AM   Specimen: BLOOD LEFT ARM  Result Value Ref  Range Status   Specimen Description   Final    BLOOD LEFT ARM Performed at [facility], [address], [place], [place] [address]    Special Requests   Final    BOTTLES DRAWN AEROBIC AND ANAEROBIC Blood Culture adequate volume Performed at [facility], [address] [place], [place] [address]    Culture  Setup Time   Final    GRAM POSITIVE COCCI IN BOTH AEROBIC AND ANAEROBIC BOTTLES Gram Stain Report Called to,Read Back By and Verified With: KATYLNN ELLIS,RN @1615   11/17/2019 KAY CRITICAL RESULT CALLED TO, READ BACK BY AND VERIFIED WITHErie Noe RN 2236 11/17/19 A BROWNING Performed at [facility], [place] [address] [place], [place] [address]    Culture METHICILLIN RESISTANT STAPHYLOCOCCUS AUREUS (A)  Final   Report Status 11/19/2019 FINAL  Final   Organism ID, Bacteria METHICILLIN RESISTANT STAPHYLOCOCCUS AUREUS  Final      Susceptibility   Methicillin resistant staphylococcus aureus - MIC*    CIPROFLOXACIN >=8 RESISTANT Resistant     ERYTHROMYCIN >=8 RESISTANT Resistant     GENTAMICIN <=0.5 SENSITIVE Sensitive     OXACILLIN >=4 RESISTANT Resistant     TETRACYCLINE <=1 SENSITIVE Sensitive     VANCOMYCIN <=0.5 SENSITIVE Sensitive     TRIMETH/SULFA >=320 RESISTANT Resistant     CLINDAMYCIN >=8 RESISTANT Resistant     RIFAMPIN <=0.5 SENSITIVE Sensitive      Inducible Clindamycin NEGATIVE Sensitive     * METHICILLIN RESISTANT STAPHYLOCOCCUS AUREUS  Blood Culture ID Panel (Reflexed)     Status: Abnormal   Collection Time: 11/17/19  4:20 AM  Result Value Ref Range Status   Enterococcus species NOT DETECTED NOT DETECTED Final   Listeria monocytogenes NOT DETECTED NOT DETECTED Final   Staphylococcus species DETECTED (A) NOT DETECTED Final    Comment: CRITICAL RESULT CALLED TO, READ BACK BY AND VERIFIED WITH: R DAWAY RN 2236 11/17/19 A BROWNING    Staphylococcus aureus (BCID) DETECTED (A) NOT DETECTED Final    Comment: Methicillin (oxacillin)-resistant Staphylococcus aureus (MRSA). MRSA is predictably resistant to beta-lactam antibiotics (except ceftaroline). Preferred therapy is vancomycin unless clinically contraindicated. Patient requires contact precautions if  hospitalized. CRITICAL RESULT CALLED TO, READ BACK BY AND VERIFIED WITH: R DAWAY RN 2236 11/17/19 A BROWNING    Methicillin resistance DETECTED (A) NOT DETECTED Final    Comment: CRITICAL RESULT CALLED TO, READ BACK BY AND VERIFIED WITH: R DAWAY RN 2236 11/17/19 A BROWNING    Streptococcus species NOT DETECTED NOT DETECTED Final   Streptococcus agalactiae NOT DETECTED NOT DETECTED Final   Streptococcus pneumoniae NOT DETECTED NOT DETECTED Final   Streptococcus pyogenes NOT DETECTED NOT DETECTED Final   Acinetobacter baumannii NOT DETECTED NOT DETECTED Final   Enterobacteriaceae species NOT DETECTED NOT DETECTED Final   Enterobacter cloacae complex NOT DETECTED NOT DETECTED Final   Escherichia coli NOT DETECTED NOT DETECTED Final   Klebsiella oxytoca NOT DETECTED NOT DETECTED Final   Klebsiella pneumoniae NOT DETECTED NOT DETECTED Final   Proteus species NOT DETECTED NOT DETECTED Final   Serratia marcescens NOT DETECTED NOT DETECTED Final   Haemophilus influenzae NOT DETECTED NOT DETECTED Final   Neisseria meningitidis NOT DETECTED NOT DETECTED Final   Pseudomonas aeruginosa NOT  DETECTED NOT DETECTED Final   Candida albicans NOT DETECTED NOT DETECTED Final   Candida glabrata NOT DETECTED NOT DETECTED Final   Candida krusei NOT DETECTED NOT DETECTED Final   Candida parapsilosis NOT DETECTED NOT DETECTED Final   Candida  tropicalis NOT DETECTED NOT DETECTED Final    Comment: Performed at [facility], [place] [address], [place], [place] [address]  Urine culture     Status: None   Collection Time: 11/17/19  8:09 PM   Specimen: In/Out Cath Urine  Result Value Ref Range Status   Specimen Description   Final    IN/OUT CATH URINE Performed at [facility], [address] [place], [place] [address]    Special Requests   Final    NONE Performed at [facility], [address] [place], [place] [address]    Culture   Final    NO GROWTH Performed at [facility], [place] [address], [place], [place] [address]    Report Status 11/19/2019 FINAL  Final    Radiology Studies: ECHOCARDIOGRAM COMPLETE  Result Date: 11/18/2019    ECHOCARDIOGRAM REPORT   Patient Name:   Jordan [name] Date of Exam: 11/18/2019 Medical Rec #:  [id number]      Height:       70.0 in Accession #:    [id number]     Weight:       177.5 lb Date of Birth:  11-04-1990       BSA:          1.984 m Patient Age:    29 years       BP:           156/75 mmHg Patient Gender: M              HR:           100 bpm. Exam Location:  [facility] Procedure: 2D Echo, Cardiac Doppler and Color Doppler Indications:    Bacteremia 790.7 / R78.81  History:        Patient has no prior history of Echocardiogram examinations.                 Risk Factors:Current Smoker. IV drug abuse, Hypokalemia, Sepsis.  Sonographer:    Alvino Chapel RCS Referring Phys: [phone] [place]  1. Left ventricular ejection fraction, by estimation, is 60 to 65%. The left ventricle has normal function. The left ventricle has no regional wall motion abnormalities. There is mild left ventricular hypertrophy. Left ventricular diastolic  parameters were normal.  2. Right ventricular systolic function is normal. The right ventricular size is normal.  3. Left atrial size was mildly dilated.  4. The mitral valve is normal in structure. No evidence of mitral valve regurgitation. No evidence of mitral stenosis.  5. The aortic valve is tricuspid. Aortic valve regurgitation is not visualized. No aortic stenosis is present.  6. The inferior vena cava is normal in size with greater than 50% respiratory variability, suggesting right atrial pressure of 3 mmHg. FINDINGS  Left Ventricle: Left ventricular ejection fraction, by estimation, is 60 to 65%. The left ventricle has normal function. The left ventricle has no regional wall motion abnormalities. The left ventricular internal cavity size was normal in size. There is  mild left ventricular hypertrophy. Left ventricular diastolic parameters were normal. Right Ventricle: The right ventricular size is normal. No increase in right ventricular wall thickness. Right ventricular systolic function is normal. Left Atrium: Left atrial size was mildly dilated. Right Atrium: Right atrial size was normal in size. Pericardium: There is no evidence of pericardial effusion. Mitral Valve: The mitral valve is normal in structure. No evidence of mitral valve regurgitation. No evidence of mitral valve stenosis. Tricuspid Valve: The  tricuspid valve is normal in structure. Tricuspid valve regurgitation is not demonstrated. No evidence of tricuspid stenosis. Aortic Valve: The aortic valve is tricuspid. Aortic valve regurgitation is not visualized. No aortic stenosis is present. Aortic valve mean gradient measures 5.2 mmHg. Aortic valve peak gradient measures 10.1 mmHg. Aortic valve area, by VTI measures 3.46  cm. Pulmonic Valve: The pulmonic valve was not well visualized. Pulmonic valve regurgitation is not visualized. No evidence of pulmonic stenosis. Aorta: The aortic root is normal in size and structure. Pulmonary Artery:  Indeterminant PASP, inadequate TR jet. Venous: The inferior vena cava is normal in size with greater than 50% respiratory variability, suggesting right atrial pressure of 3 mmHg. IAS/Shunts: No atrial level shunt detected by color flow Doppler.  LEFT VENTRICLE PLAX 2D LVIDd:         5.39 cm  Diastology LVIDs:         3.74 cm  LV e' lateral:   20.70 cm/s LV PW:         1.07 cm  LV E/e' lateral: 6.1 LV IVS:        1.00 cm  LV e' medial:    13.30 cm/s LVOT diam:     2.30 cm  LV E/e' medial:  9.5 LV SV:         100 LV SV Index:   50 LVOT Area:     4.15 cm  RIGHT VENTRICLE RV S prime:     21.00 cm/s TAPSE (M-mode): 2.5 cm LEFT ATRIUM             Index       RIGHT ATRIUM           Index LA diam:        3.50 cm 1.76 cm/m  RA Area:     18.70 cm LA Vol (A2C):   71.6 ml 36.09 ml/m RA Volume:   54.20 ml  27.32 ml/m LA Vol (A4C):   81.1 ml 40.88 ml/m LA Biplane Vol: 77.5 ml 39.06 ml/m  AORTIC VALVE AV Area (Vmax):    3.42 cm AV Area (Vmean):   3.13 cm AV Area (VTI):     3.46 cm AV Vmax:           159.28 cm/s AV Vmean:          104.051 cm/s AV VTI:            0.289 m AV Peak Grad:      10.1 mmHg AV Mean Grad:      5.2 mmHg LVOT Vmax:         131.00 cm/s LVOT Vmean:        78.400 cm/s LVOT VTI:          0.241 m LVOT/AV VTI ratio: 0.83  AORTA Ao Root diam: 3.00 cm MITRAL VALVE MV Area (PHT): 5.06 cm     SHUNTS MV Decel Time: 150 msec     Systemic VTI:  0.24 m MV E velocity: 127.00 cm/s  Systemic Diam: 2.30 cm MV A velocity: 56.30 cm/s MV E/A ratio:  2.26 Carlyle Dolly MD Electronically signed by Carlyle Dolly MD Signature Date/Time: 11/18/2019/4:49:21 PM    Final     Scheduled Meds: . enoxaparin (LOVENOX) injection  40 mg Subcutaneous Q24H   Continuous Infusions: . sodium chloride Stopped (11/19/19 0086)  . vancomycin 1,000 mg (11/19/19 0613)     LOS: 2 days    Time spent: 35 minutes.    Barton Dubois, MD [facility]  To contact the attending provider between 7A-7P or the covering  provider during after hours 7P-7A, please log into the web site [email or web address] and access using universal [place] password for that web site. If you do not have the password, please call the hospital operator.  11/19/2019, 2:19 PM

## 2019-11-20 ENCOUNTER — Inpatient Hospital Stay (HOSPITAL_COMMUNITY): Payer: Self-pay

## 2019-11-20 LAB — PHOSPHORUS: Phosphorus: 2.7 mg/dL (ref 2.5–4.6)

## 2019-11-20 LAB — MAGNESIUM: Magnesium: 2 mg/dL (ref 1.7–2.4)

## 2019-11-20 LAB — VANCOMYCIN, TROUGH: Vancomycin Tr: <4 ug/mL — ABNORMAL LOW (ref 15–20)

## 2019-11-20 LAB — CBC
HCT: 35.5 % — ABNORMAL LOW (ref 39.0–52.0)
HCT: 36.5 % — ABNORMAL LOW (ref 39.0–52.0)
Hemoglobin: 11.5 g/dL — ABNORMAL LOW (ref 13.0–17.0)
Hemoglobin: 11.6 g/dL — ABNORMAL LOW (ref 13.0–17.0)
MCH: 26.5 pg (ref 26.0–34.0)
MCH: 26.9 pg (ref 26.0–34.0)
MCHC: 31.8 g/dL (ref 30.0–36.0)
MCHC: 32.4 g/dL (ref 30.0–36.0)
MCV: 82.9 fL (ref 80.0–100.0)
MCV: 83.5 fL (ref 80.0–100.0)
Platelets: 317 10*3/uL (ref 150–400)
Platelets: 333 10*3/uL (ref 150–400)
RBC: 4.28 MIL/uL (ref 4.22–5.81)
RBC: 4.37 MIL/uL (ref 4.22–5.81)
RDW: 13.9 % (ref 11.5–15.5)
RDW: 14 % (ref 11.5–15.5)
WBC: 14.9 10*3/uL — ABNORMAL HIGH (ref 4.0–10.5)
WBC: 15.8 10*3/uL — ABNORMAL HIGH (ref 4.0–10.5)
nRBC: 0 % (ref 0.0–0.2)
nRBC: 0 % (ref 0.0–0.2)

## 2019-11-20 LAB — BASIC METABOLIC PANEL
Anion gap: 9 (ref 5–15)
BUN: 8 mg/dL (ref 6–20)
CO2: 24 mmol/L (ref 22–32)
Calcium: 8.8 mg/dL — ABNORMAL LOW (ref 8.9–10.3)
Chloride: 104 mmol/L (ref 98–111)
Creatinine, Ser: 0.63 mg/dL (ref 0.61–1.24)
GFR calc Af Amer: >60 mL/min (ref >60)
GFR calc non Af Amer: >60 mL/min (ref >60)
Glucose, Bld: 101 mg/dL — ABNORMAL HIGH (ref 70–99)
Potassium: 3.6 mmol/L (ref 3.5–5.1)
Sodium: 137 mmol/L (ref 135–145)

## 2019-11-20 LAB — VANCOMYCIN, PEAK: Vancomycin Pk: <4 ug/mL — ABNORMAL LOW (ref 30–40)

## 2019-11-20 MED ORDER — VANCOMYCIN HCL IN DEXTROSE 1-5 GM/200ML-% IV SOLN: 1000.0000 mg | Freq: Four times a day (QID) | INTRAVENOUS | Status: DC

## 2019-11-20 MED ORDER — GADOBUTROL 1 MMOL/ML IV SOLN
7.0000 mL | Freq: Once | INTRAVENOUS | Status: AC | PRN
  Administered 2019-11-20: 7 mL via INTRAVENOUS

## 2019-11-20 MED ORDER — SODIUM CHLORIDE 0.9 % IV SOLN
8.0000 mg/kg | Freq: Every day | INTRAVENOUS | Status: DC
  Administered 2019-11-20 – 2019-11-24 (×5): 608 mg via INTRAVENOUS
  Filled 2019-11-20 (×7): qty 12.16

## 2019-11-20 MED ORDER — TRAMADOL HCL 50 MG PO TABS
50.0000 mg | ORAL_TABLET | Freq: Four times a day (QID) | ORAL | Status: DC | PRN
  Administered 2019-11-20 – 2019-11-25 (×8): 50 mg via ORAL
  Filled 2019-11-20 (×9): qty 1

## 2019-11-20 MED ORDER — HYDROMORPHONE HCL 1 MG/ML IJ SOLN
2.0000 mg | Freq: Three times a day (TID) | INTRAMUSCULAR | Status: DC | PRN
  Administered 2019-11-20 – 2019-11-22 (×6): 2 mg via INTRAVENOUS
  Filled 2019-11-20 (×6): qty 2

## 2019-11-20 NOTE — Progress Notes (Addendum)
Pharmacy Antibiotic Note  Jordan [name] is a 29 y.o. male admitted on 11/17/2019 with MRSA bacteremia.  Pharmacy has been consulted for Vancomycin dosing.  Plan: Vanco trough < 4. Peak drawn prior to vanco dose so cannot use that level for AUC dosing. Per ID, change vancomycin to daptomycin 608 mg IV every 24 hours. Will f/u renal function, micro data, and pt's clinical condition    Height: 5\' 10"  (177.8 cm) Weight: 80.5 kg (177 lb 7.5 oz) IBW/kg (Calculated) : 73  Temp (24hrs), Avg:99.1 F (37.3 C), Min:98.5 F (36.9 C), Max:100.2 F (37.9 C)  Recent Labs  Lab 11/17/19 0417 11/17/19 0752 11/18/19 0500 11/18/19 0649 11/19/19 0543 11/20/19 0000 11/20/19 0528  WBC 18.9*  --  26.4*  --   --  15.8* 14.9*  CREATININE 1.10  --  0.83  --  0.61  --  0.63  LATICACIDVEN 2.7* 1.7 2.1* 0.8  --   --   --   VANCOTROUGH  --   --   --   --   --   --  <4*  VANCOPEAK  --   --   --   --   --  <4*  --     Estimated Creatinine Clearance: 141.9 mL/min (by C-G formula based on SCr of 0.63 mg/dL).    No Known Allergies  Antimicrobials this admission: Dapto 7/22 >>  Vanc 7/19 >> 7/22 Cefepime 7/19 >> 7/20 7/19 Flagyl x 1  Microbiology results:  BCx: MRSA  UCx:  ng   Thank you for allowing pharmacy to be a part of this patient's care.  Margot Ables, PharmD Clinical Pharmacist 11/20/2019 10:56 AM

## 2019-11-20 NOTE — Progress Notes (Signed)
      [place] Antimicrobial Management Team Staphylococcus aureus bacteremia   Staphylococcus aureus bacteremia (SAB) is associated with a high rate of complications and mortality.  Specific aspects of clinical management are critical to optimizing the outcome of patients with SAB.  Therefore, the [facility] Health Antimicrobial Management Team [facility]) has initiated an intervention aimed at improving the management of SAB at [facility].  To do so, Infectious Diseases physicians are providing an evidence-based consult for the management of all patients with SAB.     Yes No Comments  Perform follow-up blood cultures (even if the patient is afebrile) to ensure clearance of bacteremia [x]  []    Remove vascular catheter and obtain follow-up blood cultures after the removal of the catheter [x]  []  DO NOT place PICC or central line  Perform echocardiography to evaluate for endocarditis (transthoracic ECHO is 40-50% sensitive, TEE is > 90% sensitive) [x]  []  Needs TEE  Consult electrophysiologist to evaluate implanted cardiac device (pacemaker, ICD) []  []    Ensure source control [x]  []  Have all abscesses been drained effectively? Have deep seeded infections (septic joints or osteomyelitis) had appropriate surgical debridement?  Investigate for "metastatic" sites of infection [x]  []  Does the patient have ANY symptom or physical exam finding that would suggest a deeper infection (back or neck pain that may be suggestive of vertebral osteomyelitis or epidural abscess, muscle pain that could be a symptom of pyomyositis)?  Keep in mind that for deep seeded infections MRI imaging with contrast is preferred rather than other often insensitive tests such as plain x-rays, especially early in a patient's presentation.  Will get MRI of Ls spine  Change antibiotic therapy to vancomycin []  []  Beta-lactam antibiotics are preferred for MSSA due to higher cure rates.   If on Vancomycin, goal trough should be 15 - 20 mcg/mL   Estimated duration of IV antibiotic therapy:  4-6  weeks []  []  Consult case management for probably prolonged outpatient IV antibiotic therapy   29 year old with a history of injection drug use admitted the hospital with low back pain.  Because he had an ankle bracelet them house arrest MRI was not able to be performed.  CT scans of the thoracic and lumbar spine were unrevealing for infection.  Blood cultures on admission however been positive.  Chest x-ray is negative.  I discussed his case with Dr. Dyann Kief I wonder if the patient could have deep infection in the spine that is just not showing up on CT and may not show up on MRI either.   Patients TTE negative. Will pursue TEE and MRI spine  Change to cubicin

## 2019-11-20 NOTE — Progress Notes (Addendum)
PROGRESS NOTE    Jordan [name]  ELF:[id number] DOB: [birth year] DOA: 11/17/2019 PCP: Patient, No Pcp Per   Chief Complaint  Patient presents with  . Back Pain    Brief Narrative:  As per H&P written by Dr. Josephine Cables on 11/17/2019 Jordan [name] is a 29 y.o. male with medical history significant for tobacco abuse and IVDU who presents to the emergency department due to severe burning pain in the left buttock with radiation to the left foot which started around 8 PM yesterday.  Pain was rated as 10/10 on pain scale.  He complained of difficulty in being able to bear weight on the leg due to severity of the pain, however he denies numbness, tingling, bowel or bladder incontinence.  Patient states that the only position of comfort was kneeling on the floor with his elbows on the bed, however, on arrival at the ED, he was on his feet, though most of his weight was on his right leg.  Last IV heroin use was 3-4 days ago and he injects into antecubital area.  He denies chest pain, shortness of breath, fever, nausea, vomiting or abdominal pain.  ED Course: In the emergency department, initial temperature was normal at 98.34F, but this eventually increased to 127F, he was tachycardic.  Work-up in the ED showed leukocytosis, sodium 131, potassium 3.2, lactic acid 2.7, SARS coronavirus 2 negative.  Chest x-ray showed no active disease.  MRI cervical, thoracic and lumbar spine ordered and pending.  Code sepsis was initiated with IV LR per sepsis protocol and IV Vanco and cefepime ordered.  IV Solu-Medrol 25 Mg x1, ibuprofen and Robaxin were given.  Hospitalist was asked to admit.  For further evaluation and management.  Assessment & Plan: 1-sepsis [facility]) present on admission: In the setting of MRSA bacteremia -Patient with positive history of IV drug use -Lactic acid now resolved after fluid resuscitation -Currently with difficulty achieving desired vancomycin through; after discussing with infectious disease  provider will change treatment to daptomycin. -Echo no demonstrating endocarditis or vegetations; following ID recommendations will check TEE. -WBCs trending down; patient is still having low-grade temperature. -Patient with ongoing lower back pain left-sided sciatica; with concern for deep tissue infection in his spine, not seen on CT scan. Following ID recommendation will check lumbar spine MRI. -appreciate assistance and recommendations by ID service, will continue to follow their guidance in this case.   2-Hypokalemia -Potassium 3.6; continue to follow trend and replete as needed. -Magnesium within normal limits. -Continue to follow trend.  3-Left sided sciatica -With ongoing acute lower back pain complaints -Continue Toradol and Robaxin. -Check lumbar spine MRI  4-hyponatremia/lactic acidosis -Resolved with fluid resuscitation -Advised to maintain adequate hydration -Continue to follow electrolytes trend. -Lactic acid within normal limits currently.  5-Tobacco abuse -Cessation counseling has been provided -Continue nicotine patch.  6-IV drug abuse ([place]) -Cessation counseling provided -Drug of choice is IV heroin -Opiate withdrawal score will continue to be follow while inpatient. -If possible will minimize/avoid the use of narcotics; low-dose of hydromorphone will be continue as needed to assist with withdrawal symptoms.  -There is no need for benzodiazepines.   DVT prophylaxis: lovenox.  Code Status: Full code. Family Communication: No family at bedside currently. Disposition:   Status is: Inpatient  Dispo: The patient is from: home              Anticipated d/c is to: home (once antibiotic therapy completed)  Anticipated d/c date is: to be determined; might required 4-5 weeks of IV antibiotics.               Patient currently not medically stable for discharge; patient with active bacteremia secondary to MRSA and requiring IV antibiotic therapy.  Given  underlying history of IV drug abuse might ended requiring long term hospitalization for IV therapy. 2D echo has demonstrated no vegetations or endocarditis; after discussion with infectious disease decision has been made to pursued TEE and also to check for lower spine MRI at the patient continue complaining of severe pain and is spiking fever.     Consultants:   Infectious disease (Dr. Tommy Medal) recommendations given to pursuit TEE, lower spine MRI and change antibiotic to daptomycin.   Procedures:  See below for x-ray reports. 2D echo:  1. Left ventricular ejection fraction, by estimation, is 60 to 65%. The  left ventricle has normal function. The left ventricle has no regional  wall motion abnormalities. There is mild left ventricular hypertrophy.  Left ventricular diastolic parameters  were normal.  2. Right ventricular systolic function is normal. The right ventricular  size is normal.  3. Left atrial size was mildly dilated.  4. The mitral valve is normal in structure. No evidence of mitral valve  regurgitation. No evidence of mitral stenosis.  5. The aortic valve is tricuspid. Aortic valve regurgitation is not  visualized. No aortic stenosis is present.  6. The inferior vena cava is normal in size with greater than 50%  respiratory variability, suggesting right atrial pressure of 3 mmHg.    Antimicrobials:  Vancomycin 11/17/19>>>11/20/19 Daptomycin 11/20/19  Subjective: No fever currently (but with low grade temp overnight), no chest pain, no nausea, no vomiting. Continue complaining of intermittent severe left lower back pain. Vancomycin levels difficult to obtain desired through.  Objective: Vitals:   11/19/19 2304 11/20/19 0008 11/20/19 0550 11/20/19 1521  BP: (!) 148/83 (!) 145/91 136/86 116/71  Pulse: 86 (!) 108 (!) 106 97  Resp: (!) 24 20 (!) 22 16  Temp: 100.2 F (37.9 C) 98.9 F (37.2 C) 99.7 F (37.6 C)   TempSrc:  Oral    SpO2: 100% 100% 100% 100%    Weight:      Height:        Intake/Output Summary (Last 24 hours) at 11/20/2019 1723 Last data filed at 11/20/2019 1656 Gross per 24 hour  Intake 352 ml  Output 700 ml  Net -348 ml   Filed Weights   11/17/19 0225 11/17/19 2110  Weight: 77.1 kg 80.5 kg    Examination: General exam: Alert, awake, oriented x 3; no chest pain, no nausea, no vomiting, no shortness of breath. Continue complaining of lower back pain. COWS score fluctuating throughout the night. IV Dilaudid has been ordered. Respiratory system: Clear to auscultation. Respiratory effort normal. Cardiovascular system:RRR. No murmurs, rubs, gallops. Gastrointestinal system: Abdomen is nondistended, soft and nontender. No organomegaly or masses felt. Normal bowel sounds heard. Central nervous system: Alert and oriented. No focal neurological deficits. Patient reports no limitations bearing weight and moving secondary to lower back pain. (Left sciatica reported). Extremities: No cyanosis or clubbing. Skin: No rashes, no petechiae; multiple tattoos appreciated throughout his body on examination. Psychiatry: Judgement and insight appear normal. Mood & affect appropriate.    Data Reviewed: I have personally reviewed following labs and imaging studies  CBC: Recent Labs  Lab 11/17/19 0417 11/18/19 0500 11/20/19 0000 11/20/19 0528  WBC 18.9* 26.4* 15.8* 14.9*  NEUTROABS 16.3*  --   --   --   HGB 11.5* 11.2* 11.5* 11.6*  HCT 34.5* 35.2* 35.5* 36.5*  MCV 82.1 84.4 82.9 83.5  PLT 235 258 317 678    Basic Metabolic Panel: Recent Labs  Lab 11/17/19 0417 11/18/19 0500 11/19/19 0543 11/20/19 0000 11/20/19 0528  NA 131* 139 139  --  137  K 3.2* 3.6 3.4*  --  3.6  CL 98 103 104  --  104  CO2 22 24 25   --  24  GLUCOSE 114* 102* 108*  --  101*  BUN 10 11 <5*  --  8  CREATININE 1.10 0.83 0.61  --  0.63  CALCIUM 8.8* 9.1 8.8*  --  8.8*  MG  --  2.3 1.9 2.0  --   PHOS  --  2.6  --  2.7  --     GFR: Estimated  Creatinine Clearance: 141.9 mL/min (by C-G formula based on SCr of 0.63 mg/dL).  Liver Function Tests: Recent Labs  Lab 11/17/19 0417 11/18/19 0500 11/19/19 0543  AST 21 16 14*  ALT 12 14 14   ALKPHOS 107 99 94  BILITOT 0.4 0.5 0.6  PROT 7.6 7.5 6.7  ALBUMIN 3.7 3.4* 3.0*    CBG: No results for input(s): GLUCAP in the last 168 hours.   Recent Results (from the past 240 hour(s))  SARS Coronavirus 2 by RT PCR (hospital order, performed in [facility] hospital lab) Nasopharyngeal Nasopharyngeal Swab     Status: None   Collection Time: 11/17/19  4:02 AM   Specimen: Nasopharyngeal Swab  Result Value Ref Range Status   SARS Coronavirus 2 NEGATIVE NEGATIVE Final    Comment: (NOTE) SARS-CoV-2 target nucleic acids are NOT DETECTED.  The SARS-CoV-2 RNA is generally detectable in upper and lower respiratory specimens during the acute phase of infection. The lowest concentration of SARS-CoV-2 viral copies this assay can detect is 250 copies / mL. A negative result does not preclude SARS-CoV-2 infection and should not be used as the sole basis for treatment or other patient management decisions.  A negative result may occur with improper specimen collection / handling, submission of specimen other than nasopharyngeal swab, presence of viral mutation(s) within the areas targeted by this assay, and inadequate number of viral copies (<250 copies / mL). A negative result must be combined with clinical observations, patient history, and epidemiological information.  Fact Sheet for Patients:   StrictlyIdeas.no  Fact Sheet for Healthcare Providers: BankingDealers.co.za  This test is not yet approved or  cleared by the [place] FDA and has been authorized for detection and/or diagnosis of SARS-CoV-2 by FDA under an Emergency Use Authorization (EUA).  This EUA will remain in effect (meaning this test can be used) for the duration of  the COVID-19 declaration under Section 564(b)(1) of the Act, 21 U.S.C. section 360bbb-3(b)(1), unless the authorization is terminated or revoked sooner.  Performed at [facility], [address], [place], [place] [address]   Blood Culture (routine x 2)     Status: Abnormal   Collection Time: 11/17/19  4:17 AM   Specimen: Right Antecubital; Blood  Result Value Ref Range Status   Specimen Description   Final    RIGHT ANTECUBITAL Performed at [facility], [address] [place], [place] [address]    Special Requests   Final    BOTTLES DRAWN AEROBIC AND ANAEROBIC Blood Culture adequate volume Performed at [facility], [address] [place], [place] [address]  Culture  Setup Time   Final    GRAM POSITIVE COCCI IN BOTH AEROBIC AND ANAEROBIC BOTTLES Gram Stain Report Called to,Read Back By and Verified With: KATYLNNELLIS,RN @1615  11/17/2019 KAY AEROBIC BOTTLE ALSO    Culture (A)  Final    STAPHYLOCOCCUS AUREUS SUSCEPTIBILITIES PERFORMED ON PREVIOUS CULTURE WITHIN THE LAST 5 DAYS. Performed at [facility], [place] [address] [place], [place] [address]    Report Status 11/19/2019 FINAL  Final  Blood Culture (routine x 2)     Status: Abnormal   Collection Time: 11/17/19  4:20 AM   Specimen: BLOOD LEFT ARM  Result Value Ref Range Status   Specimen Description   Final    BLOOD LEFT ARM Performed at [facility], [address], [place], [place] [address]    Special Requests   Final    BOTTLES DRAWN AEROBIC AND ANAEROBIC Blood Culture adequate volume Performed at [facility], [address] [place], [place] [address]    Culture  Setup Time   Final    GRAM POSITIVE COCCI IN BOTH AEROBIC AND ANAEROBIC BOTTLES Gram Stain Report Called to,Read Back By and Verified With: KATYLNN ELLIS,RN @1615   11/17/2019 KAY CRITICAL RESULT CALLED TO, READ BACK BY AND VERIFIED WITHErie Noe RN 2236 11/17/19 A BROWNING Performed at [facility], [place] [address] [place], [place] [address]     Culture METHICILLIN RESISTANT STAPHYLOCOCCUS AUREUS (A)  Final   Report Status 11/19/2019 FINAL  Final   Organism ID, Bacteria METHICILLIN RESISTANT STAPHYLOCOCCUS AUREUS  Final      Susceptibility   Methicillin resistant staphylococcus aureus - MIC*    CIPROFLOXACIN >=8 RESISTANT Resistant     ERYTHROMYCIN >=8 RESISTANT Resistant     GENTAMICIN <=0.5 SENSITIVE Sensitive     OXACILLIN >=4 RESISTANT Resistant     TETRACYCLINE <=1 SENSITIVE Sensitive     VANCOMYCIN <=0.5 SENSITIVE Sensitive     TRIMETH/SULFA >=320 RESISTANT Resistant     CLINDAMYCIN >=8 RESISTANT Resistant     RIFAMPIN <=0.5 SENSITIVE Sensitive     Inducible Clindamycin NEGATIVE Sensitive     * METHICILLIN RESISTANT STAPHYLOCOCCUS AUREUS  Blood Culture ID Panel (Reflexed)     Status: Abnormal   Collection Time: 11/17/19  4:20 AM  Result Value Ref Range Status   Enterococcus species NOT DETECTED NOT DETECTED Final   Listeria monocytogenes NOT DETECTED NOT DETECTED Final   Staphylococcus species DETECTED (A) NOT DETECTED Final    Comment: CRITICAL RESULT CALLED TO, READ BACK BY AND VERIFIED WITH: R DAWAY RN 2236 11/17/19 A BROWNING    Staphylococcus aureus (BCID) DETECTED (A) NOT DETECTED Final    Comment: Methicillin (oxacillin)-resistant Staphylococcus aureus (MRSA). MRSA is predictably resistant to beta-lactam antibiotics (except ceftaroline). Preferred therapy is vancomycin unless clinically contraindicated. Patient requires contact precautions if  hospitalized. CRITICAL RESULT CALLED TO, READ BACK BY AND VERIFIED WITH: R DAWAY RN 2236 11/17/19 A BROWNING    Methicillin resistance DETECTED (A) NOT DETECTED Final    Comment: CRITICAL RESULT CALLED TO, READ BACK BY AND VERIFIED WITH: R DAWAY RN 2236 11/17/19 A BROWNING    Streptococcus species NOT DETECTED NOT DETECTED Final   Streptococcus agalactiae NOT DETECTED NOT DETECTED Final   Streptococcus pneumoniae NOT DETECTED NOT DETECTED Final   Streptococcus  pyogenes NOT DETECTED NOT DETECTED Final   Acinetobacter baumannii NOT DETECTED NOT DETECTED Final   Enterobacteriaceae species NOT DETECTED NOT DETECTED Final   Enterobacter cloacae complex NOT DETECTED NOT DETECTED Final  Escherichia coli NOT DETECTED NOT DETECTED Final   Klebsiella oxytoca NOT DETECTED NOT DETECTED Final   Klebsiella pneumoniae NOT DETECTED NOT DETECTED Final   Proteus species NOT DETECTED NOT DETECTED Final   Serratia marcescens NOT DETECTED NOT DETECTED Final   Haemophilus influenzae NOT DETECTED NOT DETECTED Final   Neisseria meningitidis NOT DETECTED NOT DETECTED Final   Pseudomonas aeruginosa NOT DETECTED NOT DETECTED Final   Candida albicans NOT DETECTED NOT DETECTED Final   Candida glabrata NOT DETECTED NOT DETECTED Final   Candida krusei NOT DETECTED NOT DETECTED Final   Candida parapsilosis NOT DETECTED NOT DETECTED Final   Candida tropicalis NOT DETECTED NOT DETECTED Final    Comment: Performed at [facility], [place] [address] [place], [place] [address]  Urine culture     Status: None   Collection Time: 11/17/19  8:09 PM   Specimen: In/Out Cath Urine  Result Value Ref Range Status   Specimen Description   Final    IN/OUT CATH URINE Performed at [facility], [address] [place], [place] [address]    Special Requests   Final    NONE Performed at [facility], [address], [place], [place] [address]    Culture   Final    NO GROWTH Performed at [facility], [place] [address], [place],  [address]    Report Status 11/19/2019 FINAL  Final    Radiology Studies: DG CHEST PORT 1 VIEW  Result Date: 11/19/2019 CLINICAL DATA:  29 year old male with chest pain EXAM: PORTABLE CHEST 1 VIEW COMPARISON:  Chest radiograph dated 11/17/2019 and CT dated 09/10/2019. FINDINGS: No focal consolidation, pleural effusion, or pneumothorax. The cardiac silhouette is within limits. No acute osseous pathology. IMPRESSION: No active disease. Electronically  Signed   By: Anner Crete M.D.   On: 11/19/2019 22:44    Scheduled Meds: . enoxaparin (LOVENOX) injection  40 mg Subcutaneous Q24H  . folic acid  1 mg Oral Daily  . multivitamin with minerals  1 tablet Oral Daily  . thiamine  100 mg Oral Daily   Or  . thiamine  100 mg Intravenous Daily   Continuous Infusions: . sodium chloride 75 mL/hr at 11/20/19 0821  . DAPTOmycin (CUBICIN)  IV 608 mg (11/20/19 1434)     LOS: 3 days    Time spent: 35 minutes.    Barton Dubois, MD [facility]   To contact the attending provider between 7A-7P or the covering provider during after hours 7P-7A, please log into the web site [email or web address] and access using universal [place] password for that web site. If you do not have the password, please call the hospital operator.  11/20/2019, 5:23 PM

## 2019-11-21 ENCOUNTER — Other Ambulatory Visit (HOSPITAL_COMMUNITY): Payer: Self-pay

## 2019-11-21 LAB — CREATININE, SERUM
Creatinine, Ser: 0.58 mg/dL — ABNORMAL LOW (ref 0.61–1.24)
GFR calc Af Amer: >60 mL/min (ref >60)
GFR calc non Af Amer: >60 mL/min (ref >60)

## 2019-11-21 NOTE — Progress Notes (Signed)
Patient discussed with anestheiology, concern about methamphetamine + from UDS on 7/21 and potential issues with sedation. We will cancel TEE today, please make npo Sunday night for procedure.     Carlyle Dolly MD

## 2019-11-21 NOTE — Progress Notes (Signed)
PROGRESS NOTE    Jordan [name]  STM:[id number] DOB: [birth year] DOA: 11/17/2019 PCP: Patient, No Pcp Per   Chief Complaint  Patient presents with  . Back Pain    Brief Narrative:  As per H&P written by Dr. Josephine Cables on 11/17/2019 Jordan [name] is a 29 y.o. male with medical history significant for tobacco abuse and IVDU who presents to the emergency department due to severe burning pain in the left buttock with radiation to the left foot which started around 8 PM yesterday.  Pain was rated as 10/10 on pain scale.  He complained of difficulty in being able to bear weight on the leg due to severity of the pain, however he denies numbness, tingling, bowel or bladder incontinence.  Patient states that the only position of comfort was kneeling on the floor with his elbows on the bed, however, on arrival at the ED, he was on his feet, though most of his weight was on his right leg.  Last IV heroin use was 3-4 days ago and he injects into antecubital area.  He denies chest pain, shortness of breath, fever, nausea, vomiting or abdominal pain.  ED Course: In the emergency department, initial temperature was normal at 98.5F, but this eventually increased to 127F, he was tachycardic.  Work-up in the ED showed leukocytosis, sodium 131, potassium 3.2, lactic acid 2.7, SARS coronavirus 2 negative.  Chest x-ray showed no active disease.  MRI cervical, thoracic and lumbar spine ordered and pending.  Code sepsis was initiated with IV LR per sepsis protocol and IV Vanco and cefepime ordered.  IV Solu-Medrol 25 Mg x1, ibuprofen and Robaxin were given.  Hospitalist was asked to admit.  For further evaluation and management.  Assessment & Plan: 1-sepsis [facility]) present on admission: In the setting of MRSA bacteremia -Patient with positive history of IV drug use -Lactic acid now resolved after fluid resuscitation -Currently with difficulty achieving desired vancomycin through; after discussing with infectious disease  provider will change treatment to daptomycin. -Echo no demonstrating endocarditis or vegetations; following ID recommendations will check TEE (anesthesia service has cancel procedure today due to significant abnormalities on his UDS including methamphetamines, plan is for TEE on 11/24/2019). -WBCs trending down; patient is still having low-grade temperature. -Patient with ongoing lower back pain left-sided sciatica; with concern for deep tissue infection in his spine, not seen on CT scan. Following ID recommendation will check lumbar spine MRI. -appreciate assistance and recommendations by ID service, will continue to follow their guidance in this case.   2-Hypokalemia -Potassium 3.6; continue to follow trend and replete as needed. -Magnesium within normal limits. -Continue to follow trend.  3-Left sided sciatica -With ongoing acute lower back pain complaints -Continue Toradol and Robaxin. -Lower back MRI without acute abnormalities; unable to rule out any deep tissue infection no seen on images.  4-hyponatremia/lactic acidosis -Resolved with fluid resuscitation -Advised to maintain adequate hydration -Continue to follow electrolytes trend. -Lactic acid within normal limits currently.  5-Tobacco abuse -Cessation counseling has been provided -Continue nicotine patch.  6-IV drug abuse ([place]) -Cessation counseling provided -Drug of choice is IV heroin -Opiate withdrawal score will continue to be follow while inpatient. -If possible will minimize/avoid the use of narcotics; low-dose of hydromorphone will be continue as needed to assist with withdrawal symptoms.  -There is no need for benzodiazepines. -Significantly abnormal UDS that was carryover from the time of admission (demonstrating the recent use of multiple recreational agents as an outpatient), currently receiving some low-dose of narcotic while inpatient.  DVT prophylaxis: lovenox.  Code Status: Full code. Family  Communication: No family at bedside currently. Disposition:   Status is: Inpatient  Dispo: The patient is from: home              Anticipated d/c is to: home (once antibiotic therapy completed)              Anticipated d/c date is: to be determined; might required 4-5 weeks of IV antibiotics.               Patient currently not medically stable for discharge; patient with active bacteremia secondary to MRSA and requiring IV antibiotic therapy.  Given underlying history of IV drug abuse might ended requiring long term hospitalization for IV therapy. 2D echo has demonstrated no vegetations or endocarditis; after discussion with infectious disease decision has been made to pursuit TEE.  Lower back MRI without acute abnormalities.   Consultants:   Infectious disease (Dr. Tommy Medal) recommendations given to pursuit TEE, lower spine MRI and change antibiotic to daptomycin.   Procedures:  See below for x-ray reports. 2D echo:  1. Left ventricular ejection fraction, by estimation, is 60 to 65%. The  left ventricle has normal function. The left ventricle has no regional  wall motion abnormalities. There is mild left ventricular hypertrophy.  Left ventricular diastolic parameters  were normal.  2. Right ventricular systolic function is normal. The right ventricular  size is normal.  3. Left atrial size was mildly dilated.  4. The mitral valve is normal in structure. No evidence of mitral valve  regurgitation. No evidence of mitral stenosis.  5. The aortic valve is tricuspid. Aortic valve regurgitation is not  visualized. No aortic stenosis is present.  6. The inferior vena cava is normal in size with greater than 50%  respiratory variability, suggesting right atrial pressure of 3 mmHg.    Antimicrobials:  Vancomycin 11/17/19>>>11/20/19 Daptomycin 11/20/19  Subjective: Still spiking low-grade temperature; complaining of intermittent back pain.  No nausea, no vomiting, no chest  pain.  Objective: Vitals:   11/20/19 0008 11/20/19 0550 11/20/19 1521 11/20/19 2021  BP: (!) 145/91 136/86 116/71   Pulse: (!) 108 (!) 106 97   Resp: 20 (!) 22 16   Temp: 98.9 F (37.2 C) 99.7 F (37.6 C)    TempSrc: Oral     SpO2: 100% 100% 100% 100%  Weight:      Height:        Intake/Output Summary (Last 24 hours) at 11/21/2019 1414 Last data filed at 11/20/2019 2100 Gross per 24 hour  Intake 112 ml  Output 900 ml  Net -788 ml   Filed Weights   11/17/19 0225 11/17/19 2110  Weight: 77.1 kg 80.5 kg    Examination: General exam: Alert, awake, oriented x 3; no chest pain, no nausea, no vomiting, no shortness of breath.  Continue 2 sprays lower back pain intermittently and is having low-grade temperature. Respiratory system: Clear to auscultation. Respiratory effort normal. Cardiovascular system:RRR. No murmurs, rubs, gallops.  No JVD on exam. Gastrointestinal system: Abdomen is nondistended, soft and nontender. No organomegaly or masses felt. Normal bowel sounds heard. Central nervous system: Alert and oriented. No focal neurological deficits. Extremities: No cyanosis or clubbing. Skin: No rashes, no petechiae;  Multiple tattoos appreciated throughout his body. Psychiatry: Judgement and insight appear normal. Mood & affect appropriate.    Data Reviewed: I have personally reviewed following labs and imaging studies  CBC: Recent Labs  Lab 11/17/19 0417 11/18/19 0500  11/20/19 0000 11/20/19 0528  WBC 18.9* 26.4* 15.8* 14.9*  NEUTROABS 16.3*  --   --   --   HGB 11.5* 11.2* 11.5* 11.6*  HCT 34.5* 35.2* 35.5* 36.5*  MCV 82.1 84.4 82.9 83.5  PLT 235 258 317 678    Basic Metabolic Panel: Recent Labs  Lab 11/17/19 0417 11/18/19 0500 11/19/19 0543 11/20/19 0000 11/20/19 0528 11/21/19 0419  NA 131* 139 139  --  137  --   K 3.2* 3.6 3.4*  --  3.6  --   CL 98 103 104  --  104  --   CO2 22 24 25   --  24  --   GLUCOSE 114* 102* 108*  --  101*  --   BUN 10 11 <5*   --  8  --   CREATININE 1.10 0.83 0.61  --  0.63 0.58*  CALCIUM 8.8* 9.1 8.8*  --  8.8*  --   MG  --  2.3 1.9 2.0  --   --   PHOS  --  2.6  --  2.7  --   --     GFR: Estimated Creatinine Clearance: 141.9 mL/min (A) (by C-G formula based on SCr of 0.58 mg/dL (L)).  Liver Function Tests: Recent Labs  Lab 11/17/19 0417 11/18/19 0500 11/19/19 0543  AST 21 16 14*  ALT 12 14 14   ALKPHOS 107 99 94  BILITOT 0.4 0.5 0.6  PROT 7.6 7.5 6.7  ALBUMIN 3.7 3.4* 3.0*    CBG: No results for input(s): GLUCAP in the last 168 hours.   Recent Results (from the past 240 hour(s))  SARS Coronavirus 2 by RT PCR (hospital order, performed in [facility] hospital lab) Nasopharyngeal Nasopharyngeal Swab     Status: None   Collection Time: 11/17/19  4:02 AM   Specimen: Nasopharyngeal Swab  Result Value Ref Range Status   SARS Coronavirus 2 NEGATIVE NEGATIVE Final    Comment: (NOTE) SARS-CoV-2 target nucleic acids are NOT DETECTED.  The SARS-CoV-2 RNA is generally detectable in upper and lower respiratory specimens during the acute phase of infection. The lowest concentration of SARS-CoV-2 viral copies this assay can detect is 250 copies / mL. A negative result does not preclude SARS-CoV-2 infection and should not be used as the sole basis for treatment or other patient management decisions.  A negative result may occur with improper specimen collection / handling, submission of specimen other than nasopharyngeal swab, presence of viral mutation(s) within the areas targeted by this assay, and inadequate number of viral copies (<250 copies / mL). A negative result must be combined with clinical observations, patient history, and epidemiological information.  Fact Sheet for Patients:   StrictlyIdeas.no  Fact Sheet for Healthcare Providers: BankingDealers.co.za  This test is not yet approved or  cleared by the [place] FDA and has been  authorized for detection and/or diagnosis of SARS-CoV-2 by FDA under an Emergency Use Authorization (EUA).  This EUA will remain in effect (meaning this test can be used) for the duration of the COVID-19 declaration under Section 564(b)(1) of the Act, 21 U.S.C. section 360bbb-3(b)(1), unless the authorization is terminated or revoked sooner.  Performed at [facility], [address] [place], [place] [address]   Blood Culture (routine x 2)     Status: Abnormal   Collection Time: 11/17/19  4:17 AM   Specimen: Right Antecubital; Blood  Result Value Ref Range Status   Specimen Description   Final    RIGHT ANTECUBITAL  Performed at [facility], [address] [place], [place] [address]    Special Requests   Final    BOTTLES DRAWN AEROBIC AND ANAEROBIC Blood Culture adequate volume Performed at [facility], [address], [place], [place] [address]    Culture  Setup Time   Final    GRAM POSITIVE COCCI IN BOTH AEROBIC AND ANAEROBIC BOTTLES Gram Stain Report Called to,Read Back By and Verified With: KATYLNNELLIS,RN @1615  11/17/2019 KAY AEROBIC BOTTLE ALSO    Culture (A)  Final    STAPHYLOCOCCUS AUREUS SUSCEPTIBILITIES PERFORMED ON PREVIOUS CULTURE WITHIN THE LAST 5 DAYS. Performed at [facility], [place] [address] [place], [place] [address]    Report Status 11/19/2019 FINAL  Final  Blood Culture (routine x 2)     Status: Abnormal   Collection Time: 11/17/19  4:20 AM   Specimen: BLOOD LEFT ARM  Result Value Ref Range Status   Specimen Description   Final    BLOOD LEFT ARM Performed at [facility], [address] [place], [place] [address]    Special Requests   Final    BOTTLES DRAWN AEROBIC AND ANAEROBIC Blood Culture adequate volume Performed at [facility], [address] [place], [place] [address]    Culture  Setup Time   Final    GRAM POSITIVE COCCI IN BOTH AEROBIC AND ANAEROBIC BOTTLES Gram Stain Report Called to,Read Back By and Verified With: KATYLNN ELLIS,RN  @1615   11/17/2019 KAY CRITICAL RESULT CALLED TO, READ BACK BY AND VERIFIED WITHErie Noe RN 2236 11/17/19 A BROWNING Performed at [facility], [place] [address] [place], [place] [address]    Culture METHICILLIN RESISTANT STAPHYLOCOCCUS AUREUS (A)  Final   Report Status 11/19/2019 FINAL  Final   Organism ID, Bacteria METHICILLIN RESISTANT STAPHYLOCOCCUS AUREUS  Final      Susceptibility   Methicillin resistant staphylococcus aureus - MIC*    CIPROFLOXACIN >=8 RESISTANT Resistant     ERYTHROMYCIN >=8 RESISTANT Resistant     GENTAMICIN <=0.5 SENSITIVE Sensitive     OXACILLIN >=4 RESISTANT Resistant     TETRACYCLINE <=1 SENSITIVE Sensitive     VANCOMYCIN <=0.5 SENSITIVE Sensitive     TRIMETH/SULFA >=320 RESISTANT Resistant     CLINDAMYCIN >=8 RESISTANT Resistant     RIFAMPIN <=0.5 SENSITIVE Sensitive     Inducible Clindamycin NEGATIVE Sensitive     * METHICILLIN RESISTANT STAPHYLOCOCCUS AUREUS  Blood Culture ID Panel (Reflexed)     Status: Abnormal   Collection Time: 11/17/19  4:20 AM  Result Value Ref Range Status   Enterococcus species NOT DETECTED NOT DETECTED Final   Listeria monocytogenes NOT DETECTED NOT DETECTED Final   Staphylococcus species DETECTED (A) NOT DETECTED Final    Comment: CRITICAL RESULT CALLED TO, READ BACK BY AND VERIFIED WITH: R DAWAY RN 2236 11/17/19 A BROWNING    Staphylococcus aureus (BCID) DETECTED (A) NOT DETECTED Final    Comment: Methicillin (oxacillin)-resistant Staphylococcus aureus (MRSA). MRSA is predictably resistant to beta-lactam antibiotics (except ceftaroline). Preferred therapy is vancomycin unless clinically contraindicated. Patient requires contact precautions if  hospitalized. CRITICAL RESULT CALLED TO, READ BACK BY AND VERIFIED WITH: R DAWAY RN 2236 11/17/19 A BROWNING    Methicillin resistance DETECTED (A) NOT DETECTED Final    Comment: CRITICAL RESULT CALLED TO, READ BACK BY AND VERIFIED WITH: R DAWAY RN 2236 11/17/19 A BROWNING     Streptococcus species NOT DETECTED NOT DETECTED Final   Streptococcus agalactiae NOT DETECTED NOT DETECTED Final  Streptococcus pneumoniae NOT DETECTED NOT DETECTED Final   Streptococcus pyogenes NOT DETECTED NOT DETECTED Final   Acinetobacter baumannii NOT DETECTED NOT DETECTED Final   Enterobacteriaceae species NOT DETECTED NOT DETECTED Final   Enterobacter cloacae complex NOT DETECTED NOT DETECTED Final   Escherichia coli NOT DETECTED NOT DETECTED Final   Klebsiella oxytoca NOT DETECTED NOT DETECTED Final   Klebsiella pneumoniae NOT DETECTED NOT DETECTED Final   Proteus species NOT DETECTED NOT DETECTED Final   Serratia marcescens NOT DETECTED NOT DETECTED Final   Haemophilus influenzae NOT DETECTED NOT DETECTED Final   Neisseria meningitidis NOT DETECTED NOT DETECTED Final   Pseudomonas aeruginosa NOT DETECTED NOT DETECTED Final   Candida albicans NOT DETECTED NOT DETECTED Final   Candida glabrata NOT DETECTED NOT DETECTED Final   Candida krusei NOT DETECTED NOT DETECTED Final   Candida parapsilosis NOT DETECTED NOT DETECTED Final   Candida tropicalis NOT DETECTED NOT DETECTED Final    Comment: Performed at [facility], [place] [address], [place], [place] [address]  Urine culture     Status: None   Collection Time: 11/17/19  8:09 PM   Specimen: In/Out Cath Urine  Result Value Ref Range Status   Specimen Description   Final    IN/OUT CATH URINE Performed at [facility], [address], [place], [place] [address]    Special Requests   Final    NONE Performed at [facility], [address], [place], [place] [address]    Culture   Final    NO GROWTH Performed at [facility], [place] [address], [place], [place] [address]    Report Status 11/19/2019 FINAL  Final    Radiology Studies: MR Lumbar Spine W Wo Contrast  Result Date: 11/20/2019 CLINICAL DATA:  Left buttock and lower extremity pain. EXAM: MRI LUMBAR SPINE WITHOUT AND WITH CONTRAST TECHNIQUE: Multiplanar and  multiecho pulse sequences of the lumbar spine were obtained without and with intravenous contrast. CONTRAST:  67mL GADAVIST GADOBUTROL 1 MMOL/ML IV SOLN COMPARISON:  None. FINDINGS: Segmentation:  Standard. Alignment:  Physiologic. Vertebrae:  No fracture, evidence of discitis, or bone lesion. Conus medullaris and cauda equina: Conus extends to the L1 level. Conus and cauda equina appear normal. Paraspinal and other soft tissues: Negative Disc levels: No disc herniation spinal canal stenosis or neural impingement. Normal facets. No abnormal contrast enhancement. IMPRESSION: Normal MRI of the lumbar spine. Electronically Signed   By: Ulyses Jarred M.D.   On: 11/20/2019 20:42   DG CHEST PORT 1 VIEW  Result Date: 11/19/2019 CLINICAL DATA:  29 year old male with chest pain EXAM: PORTABLE CHEST 1 VIEW COMPARISON:  Chest radiograph dated 11/17/2019 and CT dated 09/10/2019. FINDINGS: No focal consolidation, pleural effusion, or pneumothorax. The cardiac silhouette is within limits. No acute osseous pathology. IMPRESSION: No active disease. Electronically Signed   By: Anner Crete M.D.   On: 11/19/2019 22:44    Scheduled Meds: . enoxaparin (LOVENOX) injection  40 mg Subcutaneous Q24H  . folic acid  1 mg Oral Daily  . multivitamin with minerals  1 tablet Oral Daily  . thiamine  100 mg Oral Daily   Or  . thiamine  100 mg Intravenous Daily   Continuous Infusions: . DAPTOmycin (CUBICIN)  IV 608 mg (11/20/19 1434)     LOS: 4 days    Time spent: 35 minutes.    [place] Dubois, MD [facility]   To contact the attending provider between 7A-7P or the covering provider during after  hours 7P-7A, please log into the web site [email or web address] and access using universal [place] password for that web site. If you do not have the password, please call the hospital operator.  11/21/2019, 2:14 PM

## 2019-11-21 NOTE — TOC Progression Note (Signed)
Transition of Care [facility]) - Progression Note    Patient Details  Name: Jordan [name] MRN: [id number] Date of Birth: 1990/12/31  Transition of Care [facility]) CM/SW Contact  Salome Arnt, [place] Phone Number: 11/21/2019, 1:01 PM  Clinical Narrative:  LCSW provided outpatient substance abuse resource list to pt for follow up. TOC will continue to follow.        Barriers to Discharge: Other (comment) (no insurance)  Expected Discharge Plan and Services         Living arrangements for the past 2 months: Single Family Home                                       Social Determinants of Health (SDOH) Interventions    Readmission Risk Interventions Readmission Risk Prevention Plan 11/18/2019  Transportation Screening Complete

## 2019-11-21 NOTE — Progress Notes (Signed)
      [place] Antimicrobial Management Team Staphylococcus aureus bacteremia   Staphylococcus aureus bacteremia (SAB) is associated with a high rate of complications and mortality.  Specific aspects of clinical management are critical to optimizing the outcome of patients with SAB.  Therefore, the [facility] Antimicrobial Management Team [facility]) has initiated an intervention aimed at improving the management of SAB at [facility].  To do so, Infectious Diseases physicians are providing an evidence-based consult for the management of all patients with SAB.     Yes No Comments  Perform follow-up blood cultures (even if the patient is afebrile) to ensure clearance of bacteremia [x]  []    Remove vascular catheter and obtain follow-up blood cultures after the removal of the catheter [x]  []  DO NOT place PICC or central line  Perform echocardiography to evaluate for endocarditis (transthoracic ECHO is 40-50% sensitive, TEE is > 90% sensitive) [x]  []  Needs TEE which is happening on Monday  Consult electrophysiologist to evaluate implanted cardiac device (pacemaker, ICD) []  []    Ensure source control [x]  []  Have all abscesses been drained effectively? Have deep seeded infections (septic joints or osteomyelitis) had appropriate surgical debridement?  Investigate for "metastatic" sites of infection [x]  []  Does the patient have ANY symptom or physical exam finding that would suggest a deeper infection (back or neck pain that may be suggestive of vertebral osteomyelitis or epidural abscess, muscle pain that could be a symptom of pyomyositis)?  Keep in mind that for deep seeded infections MRI imaging with contrast is preferred rather than other often insensitive tests such as plain x-rays, especially early in a patient's presentation.  MRI L spine is negative  Change antibiotic therapy to vancomycin []  []  Beta-lactam antibiotics are preferred for MSSA due to higher cure rates.   If on Vancomycin, goal trough  should be 15 - 20 mcg/mL  Estimated duration of IV antibiotic therapy:  4-6  weeks []  []  Consult case management for probably prolonged outpatient IV antibiotic therapy   29 year old with a history of injection drug use admitted the hospital with low back pain.  Because he had an ankle bracelet them house arrest MRI was not able to be performed.  CT scans of the thoracic and lumbar spine were unrevealing for infection.  Blood cultures on admission however been positive.  Chest x-ray is negative.  I discussed his case with Dr. Dyann Kief I wonder if the patient could have deep infection in the spine that is just not showing up on CT and may not show up on MRI either.   Patients TTE negative.   Changed to Cubicin.  He is going to get a TEE on Monday MRI of the L-spine was negative.

## 2019-11-21 NOTE — Anesthesia Preprocedure Evaluation (Deleted)
Anesthesia Evaluation  Patient identified by MRN, date of birth, ID band Patient awake    Reviewed: Allergy & Precautions, NPO status , Patient's Chart, lab work & pertinent test results  History of Anesthesia Complications Negative for: history of anesthetic complications  Airway        Dental   Pulmonary Current Smoker and Patient abstained from smoking.,    Pulmonary exam normal breath sounds clear to auscultation       Cardiovascular Normal cardiovascular exam Rhythm:Regular Rate:Normal  1. Left ventricular ejection fraction, by estimation, is 60 to 65%. The left ventricle has normal function. The left ventricle has no regional wall motion abnormalities. There is mild left ventricular hypertrophy.  Left ventricular diastolic parameters  were normal.  2. Right ventricular systolic function is normal. The right ventricular size is normal.  3. Left atrial size was mildly dilated.  4. The mitral valve is normal in structure. No evidence of mitral valve regurgitation. No evidence of mitral stenosis.  5. The aortic valve is tricuspid. Aortic valve regurgitation is not visualized. No aortic stenosis is present.  6. The inferior vena cava is normal in size with greater than 50% respiratory variability, suggesting right atrial pressure of 3 mmHg.    Neuro/Psych  Neuromuscular disease    GI/Hepatic (+)     substance abuse (IV DRUG USE)  alcohol use,   Endo/Other    Renal/GU      Musculoskeletal  (+) Arthritis  (left sided sciatica),   Abdominal   Peds  Hematology   Anesthesia Other Findings   Reproductive/Obstetrics                            Anesthesia Physical Anesthesia Plan  ASA: IV  Anesthesia Plan: General   Post-op Pain Management:    Induction: Intravenous  PONV Risk Score and Plan: 1 and TIVA  Airway Management Planned: Natural Airway and Simple Face Mask  Additional  Equipment:   Intra-op Plan:   Post-operative Plan:   Informed Consent: I have reviewed the patients History and Physical, chart, labs and discussed the procedure including the risks, benefits and alternatives for the proposed anesthesia with the patient or authorized representative who has indicated his/her understanding and acceptance.     Dental advisory given  Plan Discussed with: CRNA  Anesthesia Plan Comments:         Anesthesia Quick Evaluation

## 2019-11-21 NOTE — Progress Notes (Signed)
Physical Therapy Treatment Patient Details Name: Jordan [name] MRN: [id number] DOB: Mar 29, 1991 Today's Date: 11/21/2019    History of Present Illness 29 yo male presenting to ED with severe burning pain in the left buttock with radiation to the left foot. Pt reports difficulty weight bearing on left leg due to severity of pain. Past medical history significant for tobacco abuse and IVDU.     PT Comments    Patient's mother present for session. Patient stating severe low back/L buttock pain radiating down LLE which increases with assessment and palpation of the area. Patient requires min assist to transition to prone positioning secondary to pain. He tolerates prone positioning well and is able to perform repeated prone on elbows/ prone press [place] to assess response on symptoms. He states minimal improvement of pain following (from 9/10 to 8.5/10). Patient requires min assist to transition back to supine positioning and is fatigued following bed mobility and exercises demonstrating impaired activity tolerance. Patient repositioned in bed and left with mother present. Patient will benefit from continued physical therapy in hospital and recommended venue below to increase strength, balance, endurance for safe ADLs and gait.     Follow Up Recommendations  Outpatient PT     Equipment Recommendations  Rolling walker with 5" wheels    Recommendations for Other Services       Precautions / Restrictions Precautions Precautions: Fall Restrictions Weight Bearing Restrictions: No    Mobility  Bed Mobility Overal bed mobility: Needs Assistance Bed Mobility: Rolling Rolling: Min assist            Transfers                    Ambulation/Gait                 Stairs             Wheelchair Mobility    Modified Rankin (Stroke Patients Only)       Balance                                            Cognition Arousal/Alertness:  Awake/alert Behavior During Therapy: WFL for tasks assessed/performed Overall Cognitive Status: Within Functional Limits for tasks assessed                                        Exercises Other Exercises Other Exercises: prone lying 60 seconds Other Exercises: prone on elbows 5x10 second holds Other Exercises: Prone press up 2x    General Comments        Pertinent Vitals/Pain Pain Assessment: 0-10 Pain Score: 9  Pain Location: low back and LLE Pain Descriptors / Indicators: Grimacing;Discomfort Pain Intervention(s): Limited activity within patient's tolerance;Monitored during session;Repositioned    Home Living                      Prior Function            PT Goals (current goals can now be found in the care plan section) Acute Rehab PT Goals Patient Stated Goal: walk without LLE pain PT Goal Formulation: With patient Time For Goal Achievement: 11/25/19 Potential to Achieve Goals: Good Progress towards PT goals: Progressing toward goals    Frequency    Min 3X/week  PT Plan Current plan remains appropriate    Co-evaluation              AM-PAC PT "6 Clicks" Mobility   Outcome Measure  Help needed turning from your back to your side while in a flat bed without using bedrails?: None Help needed moving from lying on your back to sitting on the side of a flat bed without using bedrails?: None Help needed moving to and from a bed to a chair (including a wheelchair)?: A Little Help needed standing up from a chair using your arms (e.g., wheelchair or bedside chair)?: A Little Help needed to walk in hospital room?: A Little Help needed climbing 3-5 steps with a railing? : A Lot 6 Click Score: 19    End of Session   Activity Tolerance: Patient tolerated treatment well;Patient limited by pain Patient left: in bed;with call bell/phone within reach;with family/visitor present Nurse Communication: Mobility status PT Visit  Diagnosis: Unsteadiness on feet (R26.81);Other abnormalities of gait and mobility (R26.89);Muscle weakness (generalized) (M62.81)     Time: 1093-2355 PT Time Calculation (min) (ACUTE ONLY): 13 min  Charges:  $Therapeutic Exercise: 8-22 mins                     2:49 PM, 11/21/19 Mearl Latin PT, DPT Physical Therapist at [facility]

## 2019-11-22 LAB — CBC
HCT: 34.5 % — ABNORMAL LOW (ref 39.0–52.0)
Hemoglobin: 11 g/dL — ABNORMAL LOW (ref 13.0–17.0)
MCH: 26.6 pg (ref 26.0–34.0)
MCHC: 31.9 g/dL (ref 30.0–36.0)
MCV: 83.5 fL (ref 80.0–100.0)
Platelets: 457 10*3/uL — ABNORMAL HIGH (ref 150–400)
RBC: 4.13 MIL/uL — ABNORMAL LOW (ref 4.22–5.81)
RDW: 14.3 % (ref 11.5–15.5)
WBC: 13.2 10*3/uL — ABNORMAL HIGH (ref 4.0–10.5)
nRBC: 0 % (ref 0.0–0.2)

## 2019-11-22 LAB — BASIC METABOLIC PANEL
Anion gap: 9 (ref 5–15)
BUN: 14 mg/dL (ref 6–20)
CO2: 25 mmol/L (ref 22–32)
Calcium: 8.5 mg/dL — ABNORMAL LOW (ref 8.9–10.3)
Chloride: 105 mmol/L (ref 98–111)
Creatinine, Ser: 0.64 mg/dL (ref 0.61–1.24)
GFR calc Af Amer: >60 mL/min (ref >60)
GFR calc non Af Amer: >60 mL/min (ref >60)
Glucose, Bld: 94 mg/dL (ref 70–99)
Potassium: 3.7 mmol/L (ref 3.5–5.1)
Sodium: 139 mmol/L (ref 135–145)

## 2019-11-22 MED ORDER — KETOROLAC TROMETHAMINE 30 MG/ML IJ SOLN
30.0000 mg | Freq: Four times a day (QID) | INTRAMUSCULAR | Status: DC | PRN
  Administered 2019-11-22 – 2019-11-25 (×13): 30 mg via INTRAVENOUS
  Filled 2019-11-22 (×13): qty 1

## 2019-11-22 MED ORDER — LORAZEPAM 2 MG/ML IJ SOLN
1.0000 mg | Freq: Once | INTRAMUSCULAR | Status: AC
  Administered 2019-11-22: 1 mg via INTRAVENOUS
  Filled 2019-11-22: qty 1

## 2019-11-22 MED ORDER — HYDROMORPHONE HCL 1 MG/ML IJ SOLN
2.0000 mg | Freq: Four times a day (QID) | INTRAMUSCULAR | Status: DC | PRN
  Administered 2019-11-22 – 2019-11-25 (×12): 2 mg via INTRAVENOUS
  Filled 2019-11-22 (×13): qty 2

## 2019-11-22 NOTE — Progress Notes (Signed)
PROGRESS NOTE    Jordan [name]  TOI:[id number] DOB: 04-23-1991 DOA: 11/17/2019 PCP: Patient, No Pcp Per   Chief Complaint  Patient presents with  . Back Pain    Brief Narrative:  As per H&P written by Dr. Josephine Cables on 11/17/2019 Jordan [name] is a 29 y.o. male with medical history significant for tobacco abuse and IVDU who presents to the emergency department due to severe burning pain in the left buttock with radiation to the left foot which started around 8 PM yesterday.  Pain was rated as 10/10 on pain scale.  He complained of difficulty in being able to bear weight on the leg due to severity of the pain, however he denies numbness, tingling, bowel or bladder incontinence.  Patient states that the only position of comfort was kneeling on the floor with his elbows on the bed, however, on arrival at the ED, he was on his feet, though most of his weight was on his right leg.  Last IV heroin use was 3-4 days ago and he injects into antecubital area.  He denies chest pain, shortness of breath, fever, nausea, vomiting or abdominal pain.  ED Course: In the emergency department, initial temperature was normal at 98.36F, but this eventually increased to 127F, he was tachycardic.  Work-up in the ED showed leukocytosis, sodium 131, potassium 3.2, lactic acid 2.7, SARS coronavirus 2 negative.  Chest x-ray showed no active disease.  MRI cervical, thoracic and lumbar spine ordered and pending.  Code sepsis was initiated with IV LR per sepsis protocol and IV Vanco and cefepime ordered.  IV Solu-Medrol 25 Mg x1, ibuprofen and Robaxin were given.  Hospitalist was asked to admit.  For further evaluation and management.  Assessment & Plan: 1-sepsis [facility]) present on admission: In the setting of MRSA bacteremia -Patient with positive history of IV drug use -Lactic acid now resolved after fluid resuscitation -Currently with difficulty achieving desired vancomycin through; after discussing with infectious disease  provider will change treatment to daptomycin. -Echo no demonstrating endocarditis or vegetations; following ID recommendations will check TEE (anesthesia service has cancel procedure today due to significant abnormalities on his UDS including methamphetamines, plan is for TEE on 11/24/2019). -WBCs continue trending down; currently afebrile.  Will repeat blood cultures. -Patient with ongoing lower back pain left-sided sciatica; with concern for deep tissue infection in his spine, not seen on CT scan. Following ID recommendation will check lumbar spine MRI. -appreciate assistance and recommendations by ID service, will continue to follow their guidance in this case.   2-Hypokalemia -Potassium 3.6; continue to follow trend and replete as needed. -Magnesium within normal limits. -Continue to follow trend.  3-Left sided sciatica -With ongoing acute lower back pain complaints -Continue Toradol and Robaxin. -Lower back MRI without acute abnormalities; unable to rule out any deep tissue infection no seen on images.  4-hyponatremia/lactic acidosis -Resolved with fluid resuscitation -Advised to maintain adequate hydration -Continue to follow electrolytes trend. -Lactic acid within normal limits currently.  5-Tobacco abuse -Cessation counseling has been provided -Continue nicotine patch.  6-IV drug abuse ([place]) -Extensive cessation counseling provided -Drug of choice is IV heroin -Opiate withdrawal score will continue to be follow while inpatient. -If possible will minimize/avoid the use of narcotics; low-dose of hydromorphone will be continue as needed to assist with withdrawal symptoms.  -There is no need for benzodiazepines. -Significantly abnormal UDS that was carryover from the time of admission (demonstrating the recent use of multiple recreational agents as an outpatient), currently receiving some low-dose of  narcotic while inpatient.   DVT prophylaxis: lovenox.  Code Status: Full  code. Family Communication: No family at bedside currently. Disposition:   Status is: Inpatient  Dispo: The patient is from: home              Anticipated d/c is to: home (once antibiotic therapy completed)              Anticipated d/c date is: to be determined; might required 4-5 weeks of IV antibiotics.               Patient currently not medically stable for discharge; patient with active bacteremia secondary to MRSA and requiring IV antibiotic therapy.  Given underlying history of IV drug abuse might ended requiring long term hospitalization for IV therapy. 2D echo has demonstrated no vegetations or endocarditis; after discussion with infectious disease decision has been made to pursuit TEE (plan is to have procedure done on 11/24/2019).  Lower back MRI without acute abnormalities.   Consultants:   Infectious disease (Dr. Tommy Medal) recommendations given to pursuit TEE, lower spine MRI and change antibiotic to daptomycin.   Procedures:  See below for x-ray reports. 2D echo:  1. Left ventricular ejection fraction, by estimation, is 60 to 65%. The  left ventricle has normal function. The left ventricle has no regional  wall motion abnormalities. There is mild left ventricular hypertrophy.  Left ventricular diastolic parameters  were normal.  2. Right ventricular systolic function is normal. The right ventricular  size is normal.  3. Left atrial size was mildly dilated.  4. The mitral valve is normal in structure. No evidence of mitral valve  regurgitation. No evidence of mitral stenosis.  5. The aortic valve is tricuspid. Aortic valve regurgitation is not  visualized. No aortic stenosis is present.  6. The inferior vena cava is normal in size with greater than 50%  respiratory variability, suggesting right atrial pressure of 3 mmHg.    Antimicrobials:  Vancomycin 11/17/19>>>11/20/19 Daptomycin 11/20/19  Subjective: Currently afebrile; no chest pain, no nausea, no vomiting,  continue expressing intermittent pain in his lower back.  Objective: Vitals:   11/20/19 2021 11/21/19 2019 11/21/19 2118 11/22/19 0519  BP:   118/71 (!) 114/62  Pulse:   78 95  Resp:   16 16  Temp:   98.7 F (37.1 C) 98.3 F (36.8 C)  TempSrc:   Oral Oral  SpO2: 100% 99% 100% 99%  Weight:      Height:        Intake/Output Summary (Last 24 hours) at 11/22/2019 1233 Last data filed at 11/22/2019 0900 Gross per 24 hour  Intake 240 ml  Output --  Net 240 ml   Filed Weights   11/17/19 0225 11/17/19 2110  Weight: 77.1 kg 80.5 kg    Examination: General exam: Alert, awake, oriented x 3; still reporting lower back pain intermittently.  No chest pain, no nausea, no vomiting, no shortness of breath.  Currently afebrile. Respiratory system: Clear to auscultation. Respiratory effort normal. Cardiovascular system:RRR. No murmurs, rubs, gallops. Gastrointestinal system: Abdomen is nondistended, soft and nontender. No organomegaly or masses felt. Normal bowel sounds heard. Central nervous system: Alert and oriented. No focal neurological deficits. Extremities: No cyanosis or clubbing. Skin: No rashes, no petechiae. Psychiatry: Judgement and insight appear normal. Mood & affect appropriate.    Data Reviewed: I have personally reviewed following labs and imaging studies  CBC: Recent Labs  Lab 11/17/19 0417 11/18/19 0500 11/20/19 0000 11/20/19 7412 11/22/19 8786  WBC 18.9* 26.4* 15.8* 14.9* 13.2*  NEUTROABS 16.3*  --   --   --   --   HGB 11.5* 11.2* 11.5* 11.6* 11.0*  HCT 34.5* 35.2* 35.5* 36.5* 34.5*  MCV 82.1 84.4 82.9 83.5 83.5  PLT 235 258 317 333 457*    Basic Metabolic Panel: Recent Labs  Lab 11/17/19 0417 11/17/19 0417 11/18/19 0500 11/19/19 0543 11/20/19 0000 11/20/19 0528 11/21/19 0419 11/22/19 0609  NA 131*  --  139 139  --  137  --  139  K 3.2*  --  3.6 3.4*  --  3.6  --  3.7  CL 98  --  103 104  --  104  --  105  CO2 22  --  24 25  --  24  --  25   GLUCOSE 114*  --  102* 108*  --  101*  --  94  BUN 10  --  11 <5*  --  8  --  14  CREATININE 1.10   < > 0.83 0.61  --  0.63 0.58* 0.64  CALCIUM 8.8*  --  9.1 8.8*  --  8.8*  --  8.5*  MG  --   --  2.3 1.9 2.0  --   --   --   PHOS  --   --  2.6  --  2.7  --   --   --    < > = values in this interval not displayed.    GFR: Estimated Creatinine Clearance: 141.9 mL/min (by C-G formula based on SCr of 0.64 mg/dL).  Liver Function Tests: Recent Labs  Lab 11/17/19 0417 11/18/19 0500 11/19/19 0543  AST 21 16 14*  ALT 12 14 14   ALKPHOS 107 99 94  BILITOT 0.4 0.5 0.6  PROT 7.6 7.5 6.7  ALBUMIN 3.7 3.4* 3.0*    CBG: No results for input(s): GLUCAP in the last 168 hours.   Recent Results (from the past 240 hour(s))  SARS Coronavirus 2 by RT PCR (hospital order, performed in [facility] hospital lab) Nasopharyngeal Nasopharyngeal Swab     Status: None   Collection Time: 11/17/19  4:02 AM   Specimen: Nasopharyngeal Swab  Result Value Ref Range Status   SARS Coronavirus 2 NEGATIVE NEGATIVE Final    Comment: (NOTE) SARS-CoV-2 target nucleic acids are NOT DETECTED.  The SARS-CoV-2 RNA is generally detectable in upper and lower respiratory specimens during the acute phase of infection. The lowest concentration of SARS-CoV-2 viral copies this assay can detect is 250 copies / mL. A negative result does not preclude SARS-CoV-2 infection and should not be used as the sole basis for treatment or other patient management decisions.  A negative result may occur with improper specimen collection / handling, submission of specimen other than nasopharyngeal swab, presence of viral mutation(s) within the areas targeted by this assay, and inadequate number of viral copies (<250 copies / mL). A negative result must be combined with clinical observations, patient history, and epidemiological information.  Fact Sheet for Patients:   StrictlyIdeas.no  Fact Sheet for  Healthcare Providers: BankingDealers.co.za  This test is not yet approved or  cleared by the [place] FDA and has been authorized for detection and/or diagnosis of SARS-CoV-2 by FDA under an Emergency Use Authorization (EUA).  This EUA will remain in effect (meaning this test can be used) for the duration of the COVID-19 declaration under Section 564(b)(1) of the Act, 21 U.S.C. section 360bbb-3(b)(1), unless the authorization is  terminated or revoked sooner.  Performed at [facility], [address], [place], [place] [address]   Blood Culture (routine x 2)     Status: Abnormal   Collection Time: 11/17/19  4:17 AM   Specimen: Right Antecubital; Blood  Result Value Ref Range Status   Specimen Description   Final    RIGHT ANTECUBITAL Performed at [facility], [address] [place], [place] [address]    Special Requests   Final    BOTTLES DRAWN AEROBIC AND ANAEROBIC Blood Culture adequate volume Performed at [facility], [address], [place], [place] [address]    Culture  Setup Time   Final    GRAM POSITIVE COCCI IN BOTH AEROBIC AND ANAEROBIC BOTTLES Gram Stain Report Called to,Read Back By and Verified With: KATYLNNELLIS,RN @1615  11/17/2019 KAY AEROBIC BOTTLE ALSO    Culture (A)  Final    STAPHYLOCOCCUS AUREUS SUSCEPTIBILITIES PERFORMED ON PREVIOUS CULTURE WITHIN THE LAST 5 DAYS. Performed at [facility], [place] [address], [place], [place] [address]    Report Status 11/19/2019 FINAL  Final  Blood Culture (routine x 2)     Status: Abnormal   Collection Time: 11/17/19  4:20 AM   Specimen: BLOOD LEFT ARM  Result Value Ref Range Status   Specimen Description   Final    BLOOD LEFT ARM Performed at [facility], [address], [place], [place] [address]    Special Requests   Final    BOTTLES DRAWN AEROBIC AND ANAEROBIC Blood Culture adequate volume Performed at [facility], [address], [place], [place] [address]    Culture  Setup Time    Final    GRAM POSITIVE COCCI IN BOTH AEROBIC AND ANAEROBIC BOTTLES Gram Stain Report Called to,Read Back By and Verified With: KATYLNN ELLIS,RN @1615   11/17/2019 KAY CRITICAL RESULT CALLED TO, READ BACK BY AND VERIFIED WITHErie Noe RN 2236 11/17/19 A BROWNING Performed at [facility], [place] [address] [place], [place] [address]    Culture METHICILLIN RESISTANT STAPHYLOCOCCUS AUREUS (A)  Final   Report Status 11/19/2019 FINAL  Final   Organism ID, Bacteria METHICILLIN RESISTANT STAPHYLOCOCCUS AUREUS  Final      Susceptibility   Methicillin resistant staphylococcus aureus - MIC*    CIPROFLOXACIN >=8 RESISTANT Resistant     ERYTHROMYCIN >=8 RESISTANT Resistant     GENTAMICIN <=0.5 SENSITIVE Sensitive     OXACILLIN >=4 RESISTANT Resistant     TETRACYCLINE <=1 SENSITIVE Sensitive     VANCOMYCIN <=0.5 SENSITIVE Sensitive     TRIMETH/SULFA >=320 RESISTANT Resistant     CLINDAMYCIN >=8 RESISTANT Resistant     RIFAMPIN <=0.5 SENSITIVE Sensitive     Inducible Clindamycin NEGATIVE Sensitive     * METHICILLIN RESISTANT STAPHYLOCOCCUS AUREUS  Blood Culture ID Panel (Reflexed)     Status: Abnormal   Collection Time: 11/17/19  4:20 AM  Result Value Ref Range Status   Enterococcus species NOT DETECTED NOT DETECTED Final   Listeria monocytogenes NOT DETECTED NOT DETECTED Final   Staphylococcus species DETECTED (A) NOT DETECTED Final    Comment: CRITICAL RESULT CALLED TO, READ BACK BY AND VERIFIED WITH: R DAWAY RN 2236 11/17/19 A BROWNING    Staphylococcus aureus (BCID) DETECTED (A) NOT DETECTED Final    Comment: Methicillin (oxacillin)-resistant Staphylococcus aureus (MRSA). MRSA is predictably resistant to beta-lactam antibiotics (except ceftaroline). Preferred therapy is vancomycin unless clinically contraindicated. Patient requires contact precautions if  hospitalized. CRITICAL RESULT CALLED TO, READ BACK BY AND VERIFIED  WITHErie Noe RN 2236 11/17/19 A BROWNING    Methicillin resistance  DETECTED (A) NOT DETECTED Final    Comment: CRITICAL RESULT CALLED TO, READ BACK BY AND VERIFIED WITH: R DAWAY RN 2236 11/17/19 A BROWNING    Streptococcus species NOT DETECTED NOT DETECTED Final   Streptococcus agalactiae NOT DETECTED NOT DETECTED Final   Streptococcus pneumoniae NOT DETECTED NOT DETECTED Final   Streptococcus pyogenes NOT DETECTED NOT DETECTED Final   Acinetobacter baumannii NOT DETECTED NOT DETECTED Final   Enterobacteriaceae species NOT DETECTED NOT DETECTED Final   Enterobacter cloacae complex NOT DETECTED NOT DETECTED Final   Escherichia coli NOT DETECTED NOT DETECTED Final   Klebsiella oxytoca NOT DETECTED NOT DETECTED Final   Klebsiella pneumoniae NOT DETECTED NOT DETECTED Final   Proteus species NOT DETECTED NOT DETECTED Final   Serratia marcescens NOT DETECTED NOT DETECTED Final   Haemophilus influenzae NOT DETECTED NOT DETECTED Final   Neisseria meningitidis NOT DETECTED NOT DETECTED Final   Pseudomonas aeruginosa NOT DETECTED NOT DETECTED Final   Candida albicans NOT DETECTED NOT DETECTED Final   Candida glabrata NOT DETECTED NOT DETECTED Final   Candida krusei NOT DETECTED NOT DETECTED Final   Candida parapsilosis NOT DETECTED NOT DETECTED Final   Candida tropicalis NOT DETECTED NOT DETECTED Final    Comment: Performed at [facility], [place]. [address] [place], [place] [address]  Urine culture     Status: None   Collection Time: 11/17/19  8:09 PM   Specimen: In/Out Cath Urine  Result Value Ref Range Status   Specimen Description   Final    IN/OUT CATH URINE Performed at [facility], [address], [place], [place] [address]    Special Requests   Final    NONE Performed at [facility], [address], [place], [place] [address]    Culture   Final    NO GROWTH Performed at [facility], [place] [address] [place], [place] [address]    Report Status 11/19/2019 FINAL  Final    Radiology Studies: MR Lumbar Spine W Wo Contrast  Result  Date: 11/20/2019 CLINICAL DATA:  Left buttock and lower extremity pain. EXAM: MRI LUMBAR SPINE WITHOUT AND WITH CONTRAST TECHNIQUE: Multiplanar and multiecho pulse sequences of the lumbar spine were obtained without and with intravenous contrast. CONTRAST:  5mL GADAVIST GADOBUTROL 1 MMOL/ML IV SOLN COMPARISON:  None. FINDINGS: Segmentation:  Standard. Alignment:  Physiologic. Vertebrae:  No fracture, evidence of discitis, or bone lesion. Conus medullaris and cauda equina: Conus extends to the L1 level. Conus and cauda equina appear normal. Paraspinal and other soft tissues: Negative Disc levels: No disc herniation spinal canal stenosis or neural impingement. Normal facets. No abnormal contrast enhancement. IMPRESSION: Normal MRI of the lumbar spine. Electronically Signed   By: Ulyses Jarred M.D.   On: 11/20/2019 20:42    Scheduled Meds: . enoxaparin (LOVENOX) injection  40 mg Subcutaneous Q24H  . folic acid  1 mg Oral Daily  . multivitamin with minerals  1 tablet Oral Daily  . thiamine  100 mg Oral Daily   Or  . thiamine  100 mg Intravenous Daily   Continuous Infusions: . DAPTOmycin (CUBICIN)  IV 608 mg (11/21/19 2015)     LOS: 5 days    Time spent: 35 minutes.    Barton Dubois, MD [facility]   To contact the attending provider between 7A-7P or the covering provider during after hours 7P-7A, please log into the web site [email or web address]  and access using universal [place] password for that web site. If you do not have the password, please call the hospital operator.  11/22/2019, 12:33 PM

## 2019-11-23 LAB — BASIC METABOLIC PANEL
Anion gap: 8 (ref 5–15)
BUN: 9 mg/dL (ref 6–20)
CO2: 25 mmol/L (ref 22–32)
Calcium: 8.3 mg/dL — ABNORMAL LOW (ref 8.9–10.3)
Chloride: 101 mmol/L (ref 98–111)
Creatinine, Ser: 0.68 mg/dL (ref 0.61–1.24)
GFR calc Af Amer: >60 mL/min (ref >60)
GFR calc non Af Amer: >60 mL/min (ref >60)
Glucose, Bld: 122 mg/dL — ABNORMAL HIGH (ref 70–99)
Potassium: 3.1 mmol/L — ABNORMAL LOW (ref 3.5–5.1)
Sodium: 134 mmol/L — ABNORMAL LOW (ref 135–145)

## 2019-11-23 LAB — CBC
HCT: 35 % — ABNORMAL LOW (ref 39.0–52.0)
Hemoglobin: 10.9 g/dL — ABNORMAL LOW (ref 13.0–17.0)
MCH: 26.5 pg (ref 26.0–34.0)
MCHC: 31.1 g/dL (ref 30.0–36.0)
MCV: 85 fL (ref 80.0–100.0)
Platelets: 463 10*3/uL — ABNORMAL HIGH (ref 150–400)
RBC: 4.12 MIL/uL — ABNORMAL LOW (ref 4.22–5.81)
RDW: 14 % (ref 11.5–15.5)
WBC: 11.8 10*3/uL — ABNORMAL HIGH (ref 4.0–10.5)
nRBC: 0 % (ref 0.0–0.2)

## 2019-11-23 LAB — MAGNESIUM: Magnesium: 2.1 mg/dL (ref 1.7–2.4)

## 2019-11-23 MED ORDER — LORAZEPAM 2 MG/ML IJ SOLN
1.0000 mg | Freq: Once | INTRAMUSCULAR | Status: AC
  Administered 2019-11-23: 1 mg via INTRAVENOUS
  Filled 2019-11-23: qty 1

## 2019-11-23 MED ORDER — BUSPIRONE HCL 5 MG PO TABS
5.0000 mg | ORAL_TABLET | Freq: Three times a day (TID) | ORAL | Status: DC | PRN
  Administered 2019-11-23 – 2019-11-25 (×2): 5 mg via ORAL
  Filled 2019-11-23 (×2): qty 1

## 2019-11-23 NOTE — Progress Notes (Signed)
PROGRESS NOTE    Jordan [name]  QIW:[id number] DOB: 30-Jun-1990 DOA: 11/17/2019 PCP: Patient, No Pcp Per   Chief Complaint  Patient presents with  . Back Pain    Brief Narrative:  As per H&P written by Dr. Josephine Cables on 11/17/2019 Jordan [name] is a 29 y.o. male with medical history significant for tobacco abuse and IVDU who presents to the emergency department due to severe burning pain in the left buttock with radiation to the left foot which started around 8 PM yesterday.  Pain was rated as 10/10 on pain scale.  He complained of difficulty in being able to bear weight on the leg due to severity of the pain, however he denies numbness, tingling, bowel or bladder incontinence.  Patient states that the only position of comfort was kneeling on the floor with his elbows on the bed, however, on arrival at the ED, he was on his feet, though most of his weight was on his right leg.  Last IV heroin use was 3-4 days ago and he injects into antecubital area.  He denies chest pain, shortness of breath, fever, nausea, vomiting or abdominal pain.  ED Course: In the emergency department, initial temperature was normal at 98.69F, but this eventually increased to 127F, he was tachycardic.  Work-up in the ED showed leukocytosis, sodium 131, potassium 3.2, lactic acid 2.7, SARS coronavirus 2 negative.  Chest x-ray showed no active disease.  MRI cervical, thoracic and lumbar spine ordered and pending.  Code sepsis was initiated with IV LR per sepsis protocol and IV Vanco and cefepime ordered.  IV Solu-Medrol 25 Mg x1, ibuprofen and Robaxin were given.  Hospitalist was asked to admit.  For further evaluation and management.  Assessment & Plan: 1-sepsis [facility]) present on admission: In the setting of MRSA bacteremia -Patient with positive history of IV drug use -Lactic acid now resolved after fluid resuscitation -Currently with difficulty achieving desired vancomycin through; after discussing with infectious disease  provider will change treatment to daptomycin. -Echo no demonstrating endocarditis or vegetations; following ID recommendations will check TEE (anesthesia service has cancel procedure today due to significant abnormalities on his UDS including methamphetamines, plan is for TEE on 11/24/2019). -WBCs continue trending down; currently afebrile.  Will repeat blood cultures. -Patient with ongoing lower back pain left-sided sciatica; with concern for deep tissue infection in his spine, not seen on CT scan. Following ID recommendation will check lumbar spine MRI. -appreciate assistance and recommendations by ID service, will continue to follow their guidance in this case.   2-Hypokalemia -Potassium 3.6; continue to follow trend and replete as needed. -Magnesium within normal limits. -Continue to follow trend.  3-Left sided sciatica -With ongoing acute lower back pain complaints -Continue Toradol and Robaxin. -Lower back MRI without acute abnormalities; unable to rule out any deep tissue infection no seen on images.  4-hyponatremia/lactic acidosis -Resolved with fluid resuscitation -Advised to maintain adequate hydration -Continue to follow electrolytes trend. -Lactic acid within normal limits currently.  5-Tobacco abuse -Cessation counseling has been provided -Continue nicotine patch.  6-IV drug abuse ([place]) -Extensive cessation counseling provided -Drug of choice is IV heroin -Opiate withdrawal score will continue to be follow while inpatient. -If possible will minimize/avoid the use of narcotics; low-dose of hydromorphone will be continue as needed to assist with withdrawal symptoms.  -There is no need for benzodiazepines. -Significantly abnormal UDS that was carryover from the time of admission (demonstrating the recent use of multiple recreational agents as an outpatient), currently receiving some low-dose of  narcotic while inpatient.   DVT prophylaxis: lovenox.  Code Status: Full  code. Family Communication: No family at bedside currently. Disposition:   Status is: Inpatient  Dispo: The patient is from: home              Anticipated d/c is to: home (once antibiotic therapy completed)              Anticipated d/c date is: to be determined; might required 4-5 weeks of IV antibiotics.               Patient currently not medically stable for discharge; patient with active bacteremia secondary to MRSA and requiring IV antibiotic therapy.  Given underlying history of IV drug abuse might ended requiring long term hospitalization for IV therapy. 2D echo has demonstrated no vegetations or endocarditis; after discussion with infectious disease decision has been made to pursuit TEE (planning to have procedure done on 11/24/2019).  Lower back MRI without acute abnormalities.  Continue to follow ID recommendations.   Consultants:   Infectious disease (Dr. Tommy Medal) recommendations given to pursuit TEE, lower spine MRI and change antibiotic to daptomycin.   Procedures:  See below for x-ray reports. 2D echo:  1. Left ventricular ejection fraction, by estimation, is 60 to 65%. The  left ventricle has normal function. The left ventricle has no regional  wall motion abnormalities. There is mild left ventricular hypertrophy.  Left ventricular diastolic parameters  were normal.  2. Right ventricular systolic function is normal. The right ventricular  size is normal.  3. Left atrial size was mildly dilated.  4. The mitral valve is normal in structure. No evidence of mitral valve  regurgitation. No evidence of mitral stenosis.  5. The aortic valve is tricuspid. Aortic valve regurgitation is not  visualized. No aortic stenosis is present.  6. The inferior vena cava is normal in size with greater than 50%  respiratory variability, suggesting right atrial pressure of 3 mmHg.    Antimicrobials:  Vancomycin 11/17/19>>>11/20/19 Daptomycin 11/20/19  Subjective: No fever, no chest  pain, no nausea, no vomiting.  Patient denies shortness of breath.  Objective: Vitals:   11/22/19 1200 11/22/19 2030 11/22/19 2135 11/23/19 0518  BP: 115/69  (!) 144/72 120/66  Pulse: 87  94 79  Resp: 18  18 18   Temp: 98.5 F (36.9 C)  99 F (37.2 C) 98.4 F (36.9 C)  TempSrc: Oral  Oral Oral  SpO2: 100% 100% 100% 100%  Weight:      Height:        Intake/Output Summary (Last 24 hours) at 11/23/2019 0838 Last data filed at 11/23/2019 0715 Gross per 24 hour  Intake 1080 ml  Output 750 ml  Net 330 ml   Filed Weights   11/17/19 0225 11/17/19 2110  Weight: 77.1 kg 80.5 kg    Examination: General exam: Alert, awake, oriented x 3, reports improvement in his lower back pain with current analgesic regimen.  No chest pain, no nausea, no vomiting.  Currently afebrile Respiratory system: Clear to auscultation. Respiratory effort normal. Cardiovascular system:RRR. No murmurs, rubs, gallops. Gastrointestinal system: Abdomen is nondistended, soft and nontender. No organomegaly or masses felt. Normal bowel sounds heard. Central nervous system: Alert and oriented. No focal neurological deficits. Extremities: No cyanosis or clubbing. Skin: No rashes, no petechiae. Psychiatry: Judgement and insight appear normal. Mood & affect appropriate.    Data Reviewed: I have personally reviewed following labs and imaging studies  CBC: Recent Labs  Lab 11/17/19 0417  11/17/19 0417 11/18/19 0500 11/20/19 0000 11/20/19 0528 11/22/19 0609 11/23/19 0615  WBC 18.9*   < > 26.4* 15.8* 14.9* 13.2* 11.8*  NEUTROABS 16.3*  --   --   --   --   --   --   HGB 11.5*   < > 11.2* 11.5* 11.6* 11.0* 10.9*  HCT 34.5*   < > 35.2* 35.5* 36.5* 34.5* 35.0*  MCV 82.1   < > 84.4 82.9 83.5 83.5 85.0  PLT 235   < > 258 317 333 457* 463*   < > = values in this interval not displayed.    Basic Metabolic Panel: Recent Labs  Lab 11/18/19 0500 11/18/19 0500 11/19/19 0543 11/20/19 0000 11/20/19 0528 11/21/19 0419  11/22/19 0609 11/23/19 0615  NA 139  --  139  --  137  --  139 134*  K 3.6  --  3.4*  --  3.6  --  3.7 3.1*  CL 103  --  104  --  104  --  105 101  CO2 24  --  25  --  24  --  25 25  GLUCOSE 102*  --  108*  --  101*  --  94 122*  BUN 11  --  <5*  --  8  --  14 9  CREATININE 0.83   < > 0.61  --  0.63 0.58* 0.64 0.68  CALCIUM 9.1  --  8.8*  --  8.8*  --  8.5* 8.3*  MG 2.3  --  1.9 2.0  --   --   --  2.1  PHOS 2.6  --   --  2.7  --   --   --   --    < > = values in this interval not displayed.    GFR: Estimated Creatinine Clearance: 141.9 mL/min (by C-G formula based on SCr of 0.68 mg/dL).  Liver Function Tests: Recent Labs  Lab 11/17/19 0417 11/18/19 0500 11/19/19 0543  AST 21 16 14*  ALT 12 14 14   ALKPHOS 107 99 94  BILITOT 0.4 0.5 0.6  PROT 7.6 7.5 6.7  ALBUMIN 3.7 3.4* 3.0*    CBG: No results for input(s): GLUCAP in the last 168 hours.   Recent Results (from the past 240 hour(s))  SARS Coronavirus 2 by RT PCR (hospital order, performed in [facility] hospital lab) Nasopharyngeal Nasopharyngeal Swab     Status: None   Collection Time: 11/17/19  4:02 AM   Specimen: Nasopharyngeal Swab  Result Value Ref Range Status   SARS Coronavirus 2 NEGATIVE NEGATIVE Final    Comment: (NOTE) SARS-CoV-2 target nucleic acids are NOT DETECTED.  The SARS-CoV-2 RNA is generally detectable in upper and lower respiratory specimens during the acute phase of infection. The lowest concentration of SARS-CoV-2 viral copies this assay can detect is 250 copies / mL. A negative result does not preclude SARS-CoV-2 infection and should not be used as the sole basis for treatment or other patient management decisions.  A negative result may occur with improper specimen collection / handling, submission of specimen other than nasopharyngeal swab, presence of viral mutation(s) within the areas targeted by this assay, and inadequate number of viral copies (<250 copies / mL). A negative result must  be combined with clinical observations, patient history, and epidemiological information.  Fact Sheet for Patients:   StrictlyIdeas.no  Fact Sheet for Healthcare Providers: BankingDealers.co.za  This test is not yet approved or  cleared by the [place]  FDA and has been authorized for detection and/or diagnosis of SARS-CoV-2 by FDA under an Emergency Use Authorization (EUA).  This EUA will remain in effect (meaning this test can be used) for the duration of the COVID-19 declaration under Section 564(b)(1) of the Act, 21 U.S.C. section 360bbb-3(b)(1), unless the authorization is terminated or revoked sooner.  Performed at [facility], [address], [place], [place] [address]   Blood Culture (routine x 2)     Status: Abnormal   Collection Time: 11/17/19  4:17 AM   Specimen: Right Antecubital; Blood  Result Value Ref Range Status   Specimen Description   Final    RIGHT ANTECUBITAL Performed at [facility], [address], [place], [place] [address]    Special Requests   Final    BOTTLES DRAWN AEROBIC AND ANAEROBIC Blood Culture adequate volume Performed at [facility], [address] [place], [place] [address]    Culture  Setup Time   Final    GRAM POSITIVE COCCI IN BOTH AEROBIC AND ANAEROBIC BOTTLES Gram Stain Report Called to,Read Back By and Verified With: KATYLNNELLIS,RN @1615  11/17/2019 KAY AEROBIC BOTTLE ALSO    Culture (A)  Final    STAPHYLOCOCCUS AUREUS SUSCEPTIBILITIES PERFORMED ON PREVIOUS CULTURE WITHIN THE LAST 5 DAYS. Performed at [facility], [place] [address] [place], [place] [address]    Report Status 11/19/2019 FINAL  Final  Blood Culture (routine x 2)     Status: Abnormal   Collection Time: 11/17/19  4:20 AM   Specimen: BLOOD LEFT ARM  Result Value Ref Range Status   Specimen Description   Final    BLOOD LEFT ARM Performed at [facility], [address], [place], [place] [address]    Special  Requests   Final    BOTTLES DRAWN AEROBIC AND ANAEROBIC Blood Culture adequate volume Performed at [facility], [address] [place], [place] [address]    Culture  Setup Time   Final    GRAM POSITIVE COCCI IN BOTH AEROBIC AND ANAEROBIC BOTTLES Gram Stain Report Called to,Read Back By and Verified With: KATYLNN ELLIS,RN @1615   11/17/2019 KAY CRITICAL RESULT CALLED TO, READ BACK BY AND VERIFIED WITHErie Noe RN 2236 11/17/19 A BROWNING Performed at [facility], [place] [address] [place], [place] [address]    Culture METHICILLIN RESISTANT STAPHYLOCOCCUS AUREUS (A)  Final   Report Status 11/19/2019 FINAL  Final   Organism ID, Bacteria METHICILLIN RESISTANT STAPHYLOCOCCUS AUREUS  Final      Susceptibility   Methicillin resistant staphylococcus aureus - MIC*    CIPROFLOXACIN >=8 RESISTANT Resistant     ERYTHROMYCIN >=8 RESISTANT Resistant     GENTAMICIN <=0.5 SENSITIVE Sensitive     OXACILLIN >=4 RESISTANT Resistant     TETRACYCLINE <=1 SENSITIVE Sensitive     VANCOMYCIN <=0.5 SENSITIVE Sensitive     TRIMETH/SULFA >=320 RESISTANT Resistant     CLINDAMYCIN >=8 RESISTANT Resistant     RIFAMPIN <=0.5 SENSITIVE Sensitive     Inducible Clindamycin NEGATIVE Sensitive     * METHICILLIN RESISTANT STAPHYLOCOCCUS AUREUS  Blood Culture ID Panel (Reflexed)     Status: Abnormal   Collection Time: 11/17/19  4:20 AM  Result Value Ref Range Status   Enterococcus species NOT DETECTED NOT DETECTED Final   Listeria monocytogenes NOT DETECTED NOT DETECTED Final   Staphylococcus species DETECTED (A) NOT DETECTED Final    Comment: CRITICAL RESULT CALLED TO, READ BACK BY AND VERIFIED WITH: R DAWAY RN 2236 11/17/19 A  BROWNING    Staphylococcus aureus (BCID) DETECTED (A) NOT DETECTED Final    Comment: Methicillin (oxacillin)-resistant Staphylococcus aureus (MRSA). MRSA is predictably resistant to beta-lactam antibiotics (except ceftaroline). Preferred therapy is vancomycin unless clinically  contraindicated. Patient requires contact precautions if  hospitalized. CRITICAL RESULT CALLED TO, READ BACK BY AND VERIFIED WITH: R DAWAY RN 2236 11/17/19 A BROWNING    Methicillin resistance DETECTED (A) NOT DETECTED Final    Comment: CRITICAL RESULT CALLED TO, READ BACK BY AND VERIFIED WITH: R DAWAY RN 2236 11/17/19 A BROWNING    Streptococcus species NOT DETECTED NOT DETECTED Final   Streptococcus agalactiae NOT DETECTED NOT DETECTED Final   Streptococcus pneumoniae NOT DETECTED NOT DETECTED Final   Streptococcus pyogenes NOT DETECTED NOT DETECTED Final   Acinetobacter baumannii NOT DETECTED NOT DETECTED Final   Enterobacteriaceae species NOT DETECTED NOT DETECTED Final   Enterobacter cloacae complex NOT DETECTED NOT DETECTED Final   Escherichia coli NOT DETECTED NOT DETECTED Final   Klebsiella oxytoca NOT DETECTED NOT DETECTED Final   Klebsiella pneumoniae NOT DETECTED NOT DETECTED Final   Proteus species NOT DETECTED NOT DETECTED Final   Serratia marcescens NOT DETECTED NOT DETECTED Final   Haemophilus influenzae NOT DETECTED NOT DETECTED Final   Neisseria meningitidis NOT DETECTED NOT DETECTED Final   Pseudomonas aeruginosa NOT DETECTED NOT DETECTED Final   Candida albicans NOT DETECTED NOT DETECTED Final   Candida glabrata NOT DETECTED NOT DETECTED Final   Candida krusei NOT DETECTED NOT DETECTED Final   Candida parapsilosis NOT DETECTED NOT DETECTED Final   Candida tropicalis NOT DETECTED NOT DETECTED Final    Comment: Performed at [facility], [place]. [address], [place], [place] [address]  Urine culture     Status: None   Collection Time: 11/17/19  8:09 PM   Specimen: In/Out Cath Urine  Result Value Ref Range Status   Specimen Description   Final    IN/OUT CATH URINE Performed at [facility], [address], [place], [place] [address]    Special Requests   Final    NONE Performed at [facility], [address], [place], [place] [address]    Culture   Final    NO  GROWTH Performed at [facility], [place] [address] [place], [place] [address]    Report Status 11/19/2019 FINAL  Final  Culture, blood (Routine X 2) w Reflex to ID Panel     Status: None (Preliminary result)   Collection Time: 11/22/19  1:03 PM   Specimen: Right Antecubital; Blood  Result Value Ref Range Status   Specimen Description   Final    RIGHT ANTECUBITAL BOTTLES DRAWN AEROBIC AND ANAEROBIC   Special Requests   Final    Blood Culture adequate volume Performed at [facility], [address] [place], [place] [address]    Culture PENDING  Incomplete   Report Status PENDING  Incomplete  Culture, blood (Routine X 2) w Reflex to ID Panel     Status: None (Preliminary result)   Collection Time: 11/22/19  1:38 PM   Specimen: BLOOD LEFT ARM  Result Value Ref Range Status   Specimen Description BLOOD LEFT ARM BOTTLES DRAWN AEROBIC AND ANAEROBIC  Final   Special Requests   Final    Blood Culture adequate volume Performed at [facility], [address] [place], [place] [address]    Culture PENDING  Incomplete   Report Status PENDING  Incomplete    Radiology Studies: No results found.  Scheduled  Meds: . enoxaparin (LOVENOX) injection  40 mg Subcutaneous Q24H  . folic acid  1 mg Oral Daily  . multivitamin with minerals  1 tablet Oral Daily  . thiamine  100 mg Oral Daily   Or  . thiamine  100 mg Intravenous Daily   Continuous Infusions: . DAPTOmycin (CUBICIN)  IV 608 mg (11/22/19 1957)     LOS: 6 days    Time spent: 35 minutes.    Barton Dubois, MD [facility]   To contact the attending provider between 7A-7P or the covering provider during after hours 7P-7A, please log into the web site [email or web address] and access using universal [place] password for that web site. If you do not have the password, please call the hospital operator.  11/23/2019, 8:38 AM

## 2019-11-23 NOTE — Progress Notes (Signed)
Pharmacy Antibiotic Note  Jordan [name] is a 29 y.o. male admitted on 11/17/2019 with MRSA bacteremia.  Pharmacy has been consulted for Vancomycin dosing.  Plan: Continue daptomycin 608 mg IV every 24 hours. Will f/u renal function, micro data, and pt's clinical condition plan is for TEE on 11/24/2019    Height: 5\' 10"  (177.8 cm) Weight: 80.5 kg (177 lb 7.5 oz) IBW/kg (Calculated) : 73  Temp (24hrs), Avg:98.7 F (37.1 C), Min:98.4 F (36.9 C), Max:99 F (37.2 C)  Recent Labs  Lab 11/17/19 0417 11/17/19 0417 11/17/19 0752 11/18/19 0500 11/18/19 0500 11/18/19 0649 11/19/19 0543 11/20/19 0000 11/20/19 0528 11/21/19 0419 11/22/19 0609 11/23/19 0615  WBC 18.9*   < >  --  26.4*  --   --   --  15.8* 14.9*  --  13.2* 11.8*  CREATININE 1.10   < >  --  0.83   < >  --  0.61  --  0.63 0.58* 0.64 0.68  LATICACIDVEN 2.7*  --  1.7 2.1*  --  0.8  --   --   --   --   --   --   VANCOTROUGH  --   --   --   --   --   --   --   --  <4*  --   --   --   VANCOPEAK  --   --   --   --   --   --   --  <4*  --   --   --   --    < > = values in this interval not displayed.    Estimated Creatinine Clearance: 141.9 mL/min (by C-G formula based on SCr of 0.68 mg/dL).    No Known Allergies  Antimicrobials this admission: Dapto 7/22 >>  Vanc 7/19 >> 7/22 Cefepime 7/19 >> 7/20 7/19 Flagyl x 1  Microbiology results:  7/19 [facility] x2: MRSA in both bottles BCID shows MRSA 7/19 Ucx: ng 7/24 Bcx: ngtd    Thank you for allowing pharmacy to be a part of this patient's care.  Margot Ables, PharmD Clinical Pharmacist 11/23/2019 12:47 PM

## 2019-11-24 ENCOUNTER — Inpatient Hospital Stay (HOSPITAL_COMMUNITY): Payer: Self-pay | Admitting: Anesthesiology

## 2019-11-24 ENCOUNTER — Encounter (HOSPITAL_COMMUNITY): Payer: Self-pay | Admitting: Internal Medicine

## 2019-11-24 ENCOUNTER — Inpatient Hospital Stay (HOSPITAL_COMMUNITY): Payer: Self-pay

## 2019-11-24 ENCOUNTER — Encounter (HOSPITAL_COMMUNITY)
Admission: EM | Disposition: A | Discharge status: Home / Self Care | Payer: Self-pay | Source: Home / Self Care | Attending: Internal Medicine

## 2019-11-24 DIAGNOSIS — R7881 Bacteremia: Secondary | ICD-10-CM

## 2019-11-24 HISTORY — PX: TEE WITHOUT CARDIOVERSION: SHX5443

## 2019-11-24 LAB — CREATININE, SERUM
Creatinine, Ser: 0.75 mg/dL (ref 0.61–1.24)
GFR calc Af Amer: >60 mL/min (ref >60)
GFR calc non Af Amer: >60 mL/min (ref >60)

## 2019-11-24 LAB — CK: Total CK: 50 U/L (ref 49–397)

## 2019-11-24 SURGERY — ECHOCARDIOGRAM, TRANSESOPHAGEAL
Anesthesia: General

## 2019-11-24 MED ORDER — SODIUM CHLORIDE 0.9 % IV SOLN: INTRAVENOUS | Status: DC

## 2019-11-24 MED ORDER — LIDOCAINE HCL (CARDIAC) PF 50 MG/5ML IV SOSY
PREFILLED_SYRINGE | INTRAVENOUS | Status: DC | PRN
  Administered 2019-11-24: 100 mg via INTRAVENOUS

## 2019-11-24 MED ORDER — HYDROMORPHONE HCL 1 MG/ML IJ SOLN: 0.2500 mg | INTRAMUSCULAR | Status: DC | PRN

## 2019-11-24 MED ORDER — LIDOCAINE VISCOUS HCL 2 % MT SOLN
15.0000 mL | Freq: Once | OROMUCOSAL | Status: AC
  Administered 2019-11-24: 15 mL via OROMUCOSAL

## 2019-11-24 MED ORDER — FENTANYL CITRATE (PF) 100 MCG/2ML IJ SOLN
INTRAMUSCULAR | Status: DC | PRN
  Administered 2019-11-24: 100 ug via INTRAVENOUS

## 2019-11-24 MED ORDER — PROPOFOL 10 MG/ML IV BOLUS
INTRAVENOUS | Status: AC
  Filled 2019-11-24: qty 40

## 2019-11-24 MED ORDER — PROPOFOL 500 MG/50ML IV EMUL
INTRAVENOUS | Status: DC | PRN
  Administered 2019-11-24: 50 mg via INTRAVENOUS
  Administered 2019-11-24: 100 mg via INTRAVENOUS
  Administered 2019-11-24: 175 ug/kg/min via INTRAVENOUS
  Administered 2019-11-24: 50 mg via INTRAVENOUS

## 2019-11-24 MED ORDER — LACTATED RINGERS IV SOLN: INTRAVENOUS | Status: DC | PRN

## 2019-11-24 MED ORDER — MIDAZOLAM HCL 2 MG/2ML IJ SOLN
INTRAMUSCULAR | Status: AC
  Filled 2019-11-24: qty 2

## 2019-11-24 MED ORDER — LIDOCAINE HCL (PF) 1 % IJ SOLN
INTRAMUSCULAR | Status: AC
  Filled 2019-11-24: qty 2

## 2019-11-24 MED ORDER — ONDANSETRON HCL 4 MG/2ML IJ SOLN: 4.0000 mg | Freq: Once | INTRAMUSCULAR | Status: DC | PRN

## 2019-11-24 MED ORDER — MIDAZOLAM HCL 5 MG/5ML IJ SOLN
INTRAMUSCULAR | Status: DC | PRN
  Administered 2019-11-24: 2 mg via INTRAVENOUS

## 2019-11-24 MED ORDER — SODIUM CHLORIDE BACTERIOSTATIC 0.9 % IJ SOLN
INTRAMUSCULAR | Status: AC
  Filled 2019-11-24: qty 20

## 2019-11-24 MED ORDER — LACTATED RINGERS IV SOLN: Freq: Once | INTRAVENOUS | Status: AC

## 2019-11-24 MED ORDER — LIDOCAINE VISCOUS HCL 2 % MT SOLN
OROMUCOSAL | Status: AC
  Filled 2019-11-24: qty 15

## 2019-11-24 MED ORDER — FENTANYL CITRATE (PF) 100 MCG/2ML IJ SOLN
INTRAMUSCULAR | Status: AC
  Filled 2019-11-24: qty 2

## 2019-11-24 MED ORDER — LIDOCAINE 2% (20 MG/ML) 5 ML SYRINGE
INTRAMUSCULAR | Status: AC
  Filled 2019-11-24: qty 5

## 2019-11-24 NOTE — Progress Notes (Signed)
*  PRELIMINARY RESULTS* Echocardiogram Echocardiogram Transesophageal has been performed.  Jordan [name] 11/24/2019, 3:27 PM

## 2019-11-24 NOTE — CV Procedure (Signed)
Transesophageal echocardiogram  Indication: MRSA bacteremia, rule out valvular vegetations  Description of procedure: After informed consent was obtained, patient was taken to the procedure suite where a timeout was performed.  Patient placed in left lateral decubitus position, bite block in place.  Monitored deep sedation was achieved via the anesthesia service with use of propofol and pretreatment with Versed.  A multiplane transesophageal echocardiographic probe was easily inserted into the esophagus with multiple images obtained.  Full report to follow.  In summary LVEF was normal at 55 to 60%.  There were no valvular vegetations visualized.  Trivial mitral, tricuspid, and pulmonic regurgitation noted.  No left atrial appendage thrombus.  No obvious interatrial shunt.  Normal descending aorta.  There were no immediate complications.  Satira Sark, M.D., F.A.C.C.

## 2019-11-24 NOTE — Progress Notes (Addendum)
OT Treatment and Discharge  Pt progressing towards established OT goals and verbalized no further difficulties with BADLs. Continued to report some pain with LB dressing; educationing pt on compensatory techniques for LB dressing to decrease pain.  Pt verbalized understanding. Discussed need for 3N1 and use of DME as tub seat and toilet riser. Pt reporting he doesn't feel he needs 3N1. Encouraged pt to continue to perform functional mobility with nursing and continue exercises. All acute OT needs met and education provided. Will sign off.     11/24/19 1000  OT Visit Information  Last OT Received On 11/24/19  Assistance Needed +1  History of Present Illness 29 yo male presenting to ED with severe burning pain in the left buttock with radiation to the left foot. Pt reports difficulty weight bearing on left leg due to severity of pain. Past medical history significant for tobacco abuse and IVDU.   Precautions  Precautions Fall  Pain Assessment  Pain Assessment Faces  Faces Pain Scale 4  Pain Location low back and LLE  Pain Descriptors / Indicators Grimacing;Discomfort  Pain Intervention(s) Monitored during session;Limited activity within patient's tolerance;Repositioned  Cognition  Arousal/Alertness Awake/alert  Behavior During Therapy WFL for tasks assessed/performed  Overall Cognitive Status Within Functional Limits for tasks assessed  General Comments Able to participate in education.  Difficult to assess due to Level of arousal  Upper Extremity Assessment  Upper Extremity Assessment Overall WFL for tasks assessed  Lower Extremity Assessment  Lower Extremity Assessment Defer to PT evaluation  LLE Deficits / Details grossly -4/5  LLE Unable to fully assess due to pain  LLE Sensation WNL  LLE Coordination WNL  ADL  Overall ADL's  Needs assistance/impaired  General ADL Comments Pt declining to participate in ADLs stating he is very tired. Provdiing education on compenatory techniques  for LB dressing to decrease pain. Discussing use of 3N1 and pt reporting he doesn't need 3n1 for shower seat or over toilet.   Balance  Overall balance assessment Needs assistance  Sitting-balance support Feet supported;No upper extremity supported  Sitting balance-Leahy Scale Good  Transfers  General transfer comment Declined to participate in OOB activity  General Comments  General comments (skin integrity, edema, etc.) Pt reporting he is participating in exercises provided by PT  Other Exercises  Other Exercises Prone press up 2x  OT - End of Session  Activity Tolerance Patient limited by fatigue;Patient limited by pain;Patient limited by lethargy  Patient left in bed;with call bell/phone within reach;with family/visitor present  Nurse Communication Mobility status  OT Assessment/Plan  OT Plan Discharge plan remains appropriate;All goals met and education completed, patient discharged from OT services;Equipment recommendations need to be updated  OT Visit Diagnosis Unsteadiness on feet (R26.81);Other abnormalities of gait and mobility (R26.89);Muscle weakness (generalized) (M62.81);Pain  Pain - Right/Left Left  Pain - part of body Leg  OT Frequency (ACUTE ONLY) Min 2X/week  Recommendations for Other Services PT consult  Follow Up Recommendations No OT follow up  OT Equipment None recommended by OT  AM-PAC OT "6 Clicks" Daily Activity Outcome Measure (Version 2)  Help from another person eating meals? 3  Help from another person taking care of personal grooming? 3  Help from another person toileting, which includes using toliet, bedpan, or urinal? 2  Help from another person bathing (including washing, rinsing, drying)? 2  Help from another person to put on and taking off regular upper body clothing? 3  Help from another person to put on and taking off   regular lower body clothing? 2  6 Click Score 15  OT Goal Progression  Progress towards OT goals Progressing toward goals;Goals  met/education completed, patient discharged from OT  Acute Rehab OT Goals  Patient Stated Goal walk without LLE pain  OT Goal Formulation With patient/family  Time For Goal Achievement 12/01/19  Potential to Achieve Goals Good  ADL Goals  Pt Will Perform Lower Body Dressing with set-up;with supervision;sit to/from stand (with or without AE)  Pt Will Transfer to Toilet with set-up;with supervision;bedside commode;regular height toilet;ambulating  Pt Will Perform Tub/Shower Transfer Tub transfer;with supervision;3 in 1;ambulating  OT Time Calculation  OT Start Time (ACUTE ONLY) 0954  OT Stop Time (ACUTE ONLY) 1003  OT Time Calculation (min) 9 min  OT General Charges  $OT Visit 1 Visit  OT Treatments  $Self Care/Home Management  8-22 mins     Charis Capehart MSOT, OTR/L Acute Rehab Pager: [phone] Office: [phone] 

## 2019-11-24 NOTE — Transfer of Care (Signed)
Immediate Anesthesia Transfer of Care Note  Patient: Jordan [name]  Procedure(s) Performed: TRANSESOPHAGEAL ECHOCARDIOGRAM (TEE) WITH PROPOFOL (N/A )  Patient Location: PACU  Anesthesia Type:General  Level of Consciousness: awake, alert , oriented and patient cooperative  Airway & Oxygen Therapy: Patient Spontanous Breathing and Patient connected to nasal cannula oxygen  Post-op Assessment: Report given to RN, Post -op Vital signs reviewed and stable and Patient moving all extremities  Post vital signs: Reviewed and stable  Last Vitals:  Vitals Value Taken Time  BP    Temp    Pulse    Resp    SpO2      Last Pain:  Vitals:   11/24/19 1422  TempSrc: Oral  PainSc: 8       Patients Stated Pain Goal: 3 (40/33/53 3174)  Complications: No complications documented.

## 2019-11-24 NOTE — Anesthesia Postprocedure Evaluation (Signed)
Anesthesia Post Note  Patient: Jordan [name]  Procedure(s) Performed: TRANSESOPHAGEAL ECHOCARDIOGRAM (TEE) WITH PROPOFOL (N/A )  Patient location during evaluation: PACU Anesthesia Type: General Level of consciousness: awake, oriented, awake and alert and patient cooperative Pain management: pain level controlled Vital Signs Assessment: post-procedure vital signs reviewed and stable Respiratory status: spontaneous breathing, respiratory function stable, nonlabored ventilation and patient connected to nasal cannula oxygen Cardiovascular status: blood pressure returned to baseline and stable Postop Assessment: no headache and no backache Anesthetic complications: no   No complications documented.   Last Vitals:  Vitals:   11/24/19 0450 11/24/19 1422  BP: (!) 129/81 128/69  Pulse: 80 70  Resp: 19 (!) 10  Temp: 37.3 C 37.1 C  SpO2: 100% 97%    Last Pain:  Vitals:   11/24/19 1422  TempSrc: Oral  PainSc: 8                  Tacy Learn

## 2019-11-24 NOTE — Progress Notes (Signed)
PROGRESS NOTE    Jordan [name]  QTM:[id number] DOB: 1990-08-21 DOA: 11/17/2019 PCP: Patient, No Pcp Per   Chief Complaint  Patient presents with  . Back Pain    Brief Narrative:  As per H&P written by Dr. Josephine Cables on 11/17/2019 Jordan [name] is a 29 y.o. male with medical history significant for tobacco abuse and IVDU who presents to the emergency department due to severe burning pain in the left buttock with radiation to the left foot which started around 8 PM yesterday.  Pain was rated as 10/10 on pain scale.  He complained of difficulty in being able to bear weight on the leg due to severity of the pain, however he denies numbness, tingling, bowel or bladder incontinence.  Patient states that the only position of comfort was kneeling on the floor with his elbows on the bed, however, on arrival at the ED, he was on his feet, though most of his weight was on his right leg.  Last IV heroin use was 3-4 days ago and he injects into antecubital area.  He denies chest pain, shortness of breath, fever, nausea, vomiting or abdominal pain.  ED Course: In the emergency department, initial temperature was normal at 98.21F, but this eventually increased to 127F, he was tachycardic.  Work-up in the ED showed leukocytosis, sodium 131, potassium 3.2, lactic acid 2.7, SARS coronavirus 2 negative.  Chest x-ray showed no active disease.  MRI cervical, thoracic and lumbar spine ordered and pending.  Code sepsis was initiated with IV LR per sepsis protocol and IV Vanco and cefepime ordered.  IV Solu-Medrol 25 Mg x1, ibuprofen and Robaxin were given.  Hospitalist was asked to admit.  For further evaluation and management.  Assessment & Plan: 1-sepsis [facility]) present on admission: In the setting of MRSA bacteremia -Patient with positive history of IV drug use -Lactic acid now resolved after fluid resuscitation -Currently with difficulty achieving desired vancomycin through; after discussing with infectious disease  provider will change treatment to daptomycin. -Echo no demonstrating endocarditis or vegetations; following ID recommendations will check TEE (anesthesia service has cancel procedure today due to significant abnormalities on his UDS including methamphetamines, plan is for TEE later today 11/24/2019). -WBCs continue trending down; currently afebrile.  Will repeat blood cultures. -Patient with ongoing lower back pain left-sided sciatica; with concern for deep tissue infection in his spine, not seen on CT scan. Following ID recommendation will check lumbar spine MRI. -appreciate assistance and recommendations by ID service, will continue to follow their guidance in this case.   2-Hypokalemia -Potassium 3.6; continue to follow trend and replete as needed. -Magnesium within normal limits. -Continue to follow trend.  3-Left sided sciatica -With ongoing acute lower back pain complaints -Continue Toradol and Robaxin. -Lower back MRI without acute abnormalities; unable to rule out any deep tissue infection no seen on images.  4-hyponatremia/lactic acidosis -Resolved with fluid resuscitation -Advised to maintain adequate hydration -Continue to follow electrolytes trend. -Lactic acid within normal limits currently.  5-Tobacco abuse -Cessation counseling has been provided -Continue nicotine patch.  6-IV drug abuse ([place]) -Extensive cessation counseling provided -Drug of choice is IV heroin -Opiate withdrawal score will continue to be follow while inpatient. -If possible will minimize/avoid the use of narcotics; low-dose of hydromorphone will be continue as needed to assist with withdrawal symptoms.  -There is no need for benzodiazepines. -Significantly abnormal UDS that was carryover from the time of admission (demonstrating the recent use of multiple recreational agents as an outpatient), currently receiving some low-dose  of narcotic while inpatient.   DVT prophylaxis: lovenox.  Code Status:  Full code. Family Communication: No family at bedside currently. Disposition:   Status is: Inpatient  Dispo: The patient is from: home              Anticipated d/c is to: home (once antibiotic therapy completed)              Anticipated d/c date is: to be determined; might required 4-5 weeks of IV antibiotics.               Patient currently not medically stable for discharge; patient with active bacteremia secondary to MRSA and requiring IV antibiotic therapy.  Given underlying history of IV drug abuse might ended requiring long term hospitalization for IV therapy. 2D echo has demonstrated no vegetations or endocarditis; after discussion with infectious disease decision has been made to pursuit TEE (planning to have procedure done later today 11/24/2019).  Lower back MRI without acute abnormalities.  Continue to follow ID recommendations.   Consultants:   Infectious disease (Dr. Tommy Medal) recommendations given to pursuit TEE, lower spine MRI and change antibiotic to daptomycin.   Procedures:  See below for x-ray reports. 2D echo:  1. Left ventricular ejection fraction, by estimation, is 60 to 65%. The  left ventricle has normal function. The left ventricle has no regional  wall motion abnormalities. There is mild left ventricular hypertrophy.  Left ventricular diastolic parameters  were normal.  2. Right ventricular systolic function is normal. The right ventricular  size is normal.  3. Left atrial size was mildly dilated.  4. The mitral valve is normal in structure. No evidence of mitral valve  regurgitation. No evidence of mitral stenosis.  5. The aortic valve is tricuspid. Aortic valve regurgitation is not  visualized. No aortic stenosis is present.  6. The inferior vena cava is normal in size with greater than 50%  respiratory variability, suggesting right atrial pressure of 3 mmHg.    Antimicrobials:  Vancomycin 11/17/19>>>11/20/19 Daptomycin  11/20/19  Subjective: Hungry, no chest pain, no nausea, no vomiting.  Patient is oriented x3.  Currently afebrile.  Continues to have intermittent episode of back pain.  Objective: Vitals:   11/23/19 1500 11/23/19 1951 11/23/19 2116 11/24/19 0450  BP: 119/69  118/80 (!) 129/81  Pulse: 89  97 80  Resp: 19  19 19   Temp: 98.3 F (36.8 C)  99.2 F (37.3 C) 99.2 F (37.3 C)  TempSrc: Oral  Oral Oral  SpO2: 100% 99% 100% 100%  Weight:      Height:        Intake/Output Summary (Last 24 hours) at 11/24/2019 1148 Last data filed at 11/23/2019 1700 Gross per 24 hour  Intake 600 ml  Output --  Net 600 ml   Filed Weights   11/17/19 0225 11/17/19 2110  Weight: 77.1 kg 80.5 kg    Examination: General exam: Alert, awake, oriented x 3, no chest pain, no nausea, no vomiting.  Continue complaining of lower back pain.  Currently afebrile. Respiratory system: Clear to auscultation. Respiratory effort normal. Cardiovascular system:RRR. No murmurs, rubs, gallops. Gastrointestinal system: Abdomen is nondistended, soft and nontender. No organomegaly or masses felt. Normal bowel sounds heard. Central nervous system: Alert and oriented. No focal neurological deficits. Extremities: No C/C/E, +pedal pulses Skin: No rashes, lesions or ulcers Psychiatry: Judgement and insight appear normal. Mood & affect appropriate.    Data Reviewed: I have personally reviewed following labs and imaging studies  CBC: Recent Labs  Lab 11/18/19 0500 11/20/19 0000 11/20/19 0528 11/22/19 0609 11/23/19 0615  WBC 26.4* 15.8* 14.9* 13.2* 11.8*  HGB 11.2* 11.5* 11.6* 11.0* 10.9*  HCT 35.2* 35.5* 36.5* 34.5* 35.0*  MCV 84.4 82.9 83.5 83.5 85.0  PLT 258 317 333 457* 463*    Basic Metabolic Panel: Recent Labs  Lab 11/18/19 0500 11/18/19 0500 11/19/19 0543 11/19/19 0543 11/20/19 0000 11/20/19 0528 11/21/19 0419 11/22/19 0609 11/23/19 0615 11/24/19 0507  NA 139  --  139  --   --  137  --  139 134*  --    K 3.6  --  3.4*  --   --  3.6  --  3.7 3.1*  --   CL 103  --  104  --   --  104  --  105 101  --   CO2 24  --  25  --   --  24  --  25 25  --   GLUCOSE 102*  --  108*  --   --  101*  --  94 122*  --   BUN 11  --  <5*  --   --  8  --  14 9  --   CREATININE 0.83   < > 0.61   < >  --  0.63 0.58* 0.64 0.68 0.75  CALCIUM 9.1  --  8.8*  --   --  8.8*  --  8.5* 8.3*  --   MG 2.3  --  1.9  --  2.0  --   --   --  2.1  --   PHOS 2.6  --   --   --  2.7  --   --   --   --   --    < > = values in this interval not displayed.    GFR: Estimated Creatinine Clearance: 141.9 mL/min (by C-G formula based on SCr of 0.75 mg/dL).  Liver Function Tests: Recent Labs  Lab 11/18/19 0500 11/19/19 0543  AST 16 14*  ALT 14 14  ALKPHOS 99 94  BILITOT 0.5 0.6  PROT 7.5 6.7  ALBUMIN 3.4* 3.0*    CBG: No results for input(s): GLUCAP in the last 168 hours.   Recent Results (from the past 240 hour(s))  SARS Coronavirus 2 by RT PCR (hospital order, performed in [facility] hospital lab) Nasopharyngeal Nasopharyngeal Swab     Status: None   Collection Time: 11/17/19  4:02 AM   Specimen: Nasopharyngeal Swab  Result Value Ref Range Status   SARS Coronavirus 2 NEGATIVE NEGATIVE Final    Comment: (NOTE) SARS-CoV-2 target nucleic acids are NOT DETECTED.  The SARS-CoV-2 RNA is generally detectable in upper and lower respiratory specimens during the acute phase of infection. The lowest concentration of SARS-CoV-2 viral copies this assay can detect is 250 copies / mL. A negative result does not preclude SARS-CoV-2 infection and should not be used as the sole basis for treatment or other patient management decisions.  A negative result may occur with improper specimen collection / handling, submission of specimen other than nasopharyngeal swab, presence of viral mutation(s) within the areas targeted by this assay, and inadequate number of viral copies (<250 copies / mL). A negative result must be combined with  clinical observations, patient history, and epidemiological information.  Fact Sheet for Patients:   StrictlyIdeas.no  Fact Sheet for Healthcare Providers: BankingDealers.co.za  This test is not yet approved or  cleared by the [place]  States FDA and has been authorized for detection and/or diagnosis of SARS-CoV-2 by FDA under an Emergency Use Authorization (EUA).  This EUA will remain in effect (meaning this test can be used) for the duration of the COVID-19 declaration under Section 564(b)(1) of the Act, 21 U.S.C. section 360bbb-3(b)(1), unless the authorization is terminated or revoked sooner.  Performed at [facility], [address] [place], [place] [address]   Blood Culture (routine x 2)     Status: Abnormal   Collection Time: 11/17/19  4:17 AM   Specimen: Right Antecubital; Blood  Result Value Ref Range Status   Specimen Description   Final    RIGHT ANTECUBITAL Performed at [facility], [address] [place], [place] [address]    Special Requests   Final    BOTTLES DRAWN AEROBIC AND ANAEROBIC Blood Culture adequate volume Performed at [facility], [address], [place], [place] [address]    Culture  Setup Time   Final    GRAM POSITIVE COCCI IN BOTH AEROBIC AND ANAEROBIC BOTTLES Gram Stain Report Called to,Read Back By and Verified With: KATYLNNELLIS,RN @1615  11/17/2019 KAY AEROBIC BOTTLE ALSO    Culture (A)  Final    STAPHYLOCOCCUS AUREUS SUSCEPTIBILITIES PERFORMED ON PREVIOUS CULTURE WITHIN THE LAST 5 DAYS. Performed at [facility], [place] [address] [place], [place] [address]    Report Status 11/19/2019 FINAL  Final  Blood Culture (routine x 2)     Status: Abnormal   Collection Time: 11/17/19  4:20 AM   Specimen: BLOOD LEFT ARM  Result Value Ref Range Status   Specimen Description   Final    BLOOD LEFT ARM Performed at [facility], [address] [place], [place] [address]    Special Requests   Final     BOTTLES DRAWN AEROBIC AND ANAEROBIC Blood Culture adequate volume Performed at [facility], [address] [place], [place] [address]    Culture  Setup Time   Final    GRAM POSITIVE COCCI IN BOTH AEROBIC AND ANAEROBIC BOTTLES Gram Stain Report Called to,Read Back By and Verified With: KATYLNN ELLIS,RN @1615   11/17/2019 KAY CRITICAL RESULT CALLED TO, READ BACK BY AND VERIFIED WITHErie Noe RN 2236 11/17/19 A BROWNING Performed at [facility], [place] [address] [place], [place] [address]    Culture METHICILLIN RESISTANT STAPHYLOCOCCUS AUREUS (A)  Final   Report Status 11/19/2019 FINAL  Final   Organism ID, Bacteria METHICILLIN RESISTANT STAPHYLOCOCCUS AUREUS  Final      Susceptibility   Methicillin resistant staphylococcus aureus - MIC*    CIPROFLOXACIN >=8 RESISTANT Resistant     ERYTHROMYCIN >=8 RESISTANT Resistant     GENTAMICIN <=0.5 SENSITIVE Sensitive     OXACILLIN >=4 RESISTANT Resistant     TETRACYCLINE <=1 SENSITIVE Sensitive     VANCOMYCIN <=0.5 SENSITIVE Sensitive     TRIMETH/SULFA >=320 RESISTANT Resistant     CLINDAMYCIN >=8 RESISTANT Resistant     RIFAMPIN <=0.5 SENSITIVE Sensitive     Inducible Clindamycin NEGATIVE Sensitive     * METHICILLIN RESISTANT STAPHYLOCOCCUS AUREUS  Blood Culture ID Panel (Reflexed)     Status: Abnormal   Collection Time: 11/17/19  4:20 AM  Result Value Ref Range Status   Enterococcus species NOT DETECTED NOT DETECTED Final   Listeria monocytogenes NOT DETECTED NOT DETECTED Final   Staphylococcus species DETECTED (A) NOT DETECTED Final    Comment: CRITICAL RESULT CALLED TO, READ BACK BY AND VERIFIED WITH: R DAWAY RN 2236 11/17/19  A BROWNING    Staphylococcus aureus (BCID) DETECTED (A) NOT DETECTED Final    Comment: Methicillin (oxacillin)-resistant Staphylococcus aureus (MRSA). MRSA is predictably resistant to beta-lactam antibiotics (except ceftaroline). Preferred therapy is vancomycin unless clinically contraindicated. Patient requires  contact precautions if  hospitalized. CRITICAL RESULT CALLED TO, READ BACK BY AND VERIFIED WITH: R DAWAY RN 2236 11/17/19 A BROWNING    Methicillin resistance DETECTED (A) NOT DETECTED Final    Comment: CRITICAL RESULT CALLED TO, READ BACK BY AND VERIFIED WITH: R DAWAY RN 2236 11/17/19 A BROWNING    Streptococcus species NOT DETECTED NOT DETECTED Final   Streptococcus agalactiae NOT DETECTED NOT DETECTED Final   Streptococcus pneumoniae NOT DETECTED NOT DETECTED Final   Streptococcus pyogenes NOT DETECTED NOT DETECTED Final   Acinetobacter baumannii NOT DETECTED NOT DETECTED Final   Enterobacteriaceae species NOT DETECTED NOT DETECTED Final   Enterobacter cloacae complex NOT DETECTED NOT DETECTED Final   Escherichia coli NOT DETECTED NOT DETECTED Final   Klebsiella oxytoca NOT DETECTED NOT DETECTED Final   Klebsiella pneumoniae NOT DETECTED NOT DETECTED Final   Proteus species NOT DETECTED NOT DETECTED Final   Serratia marcescens NOT DETECTED NOT DETECTED Final   Haemophilus influenzae NOT DETECTED NOT DETECTED Final   Neisseria meningitidis NOT DETECTED NOT DETECTED Final   Pseudomonas aeruginosa NOT DETECTED NOT DETECTED Final   Candida albicans NOT DETECTED NOT DETECTED Final   Candida glabrata NOT DETECTED NOT DETECTED Final   Candida krusei NOT DETECTED NOT DETECTED Final   Candida parapsilosis NOT DETECTED NOT DETECTED Final   Candida tropicalis NOT DETECTED NOT DETECTED Final    Comment: Performed at [facility], [place]. [address] [place], [place] [address]  Urine culture     Status: None   Collection Time: 11/17/19  8:09 PM   Specimen: In/Out Cath Urine  Result Value Ref Range Status   Specimen Description   Final    IN/OUT CATH URINE Performed at [facility], [address] [place], [place] [address]    Special Requests   Final    NONE Performed at [facility], [address], [place], [place] [address]    Culture   Final    NO GROWTH Performed at [facility], [place] [address], [place], [place] [address]    Report Status 11/19/2019 FINAL  Final  Culture, blood (Routine X 2) w Reflex to ID Panel     Status: None (Preliminary result)   Collection Time: 11/22/19  1:03 PM   Specimen: Right Antecubital; Blood  Result Value Ref Range Status   Specimen Description   Final    RIGHT ANTECUBITAL BOTTLES DRAWN AEROBIC AND ANAEROBIC   Special Requests Blood Culture adequate volume  Final   Culture   Final    NO GROWTH 2 DAYS Performed at [facility], [address] [place], [place] [address]    Report Status PENDING  Incomplete  Culture, blood (Routine X 2) w Reflex to ID Panel     Status: None (Preliminary result)   Collection Time: 11/22/19  1:38 PM   Specimen: BLOOD LEFT ARM  Result Value Ref Range Status   Specimen Description BLOOD LEFT ARM BOTTLES DRAWN AEROBIC AND ANAEROBIC  Final   Special Requests Blood Culture adequate volume  Final   Culture   Final    NO GROWTH 2 DAYS Performed at [facility], [address], [place], [place] [address]    Report Status PENDING  Incomplete  Radiology Studies: No results found.  Scheduled Meds: . enoxaparin (LOVENOX) injection  40 mg Subcutaneous Q24H  . folic acid  1 mg Oral Daily  . multivitamin with minerals  1 tablet Oral Daily  . thiamine  100 mg Oral Daily   Or  . thiamine  100 mg Intravenous Daily   Continuous Infusions: . DAPTOmycin (CUBICIN)  IV 608 mg (11/23/19 2107)     LOS: 7 days    Time spent: 35 minutes.    Barton Dubois, MD [facility]   To contact the attending provider between 7A-7P or the covering provider during after hours 7P-7A, please log into the web site [email or web address] and access using universal [place] password for that web site. If you do not have the password, please call the hospital operator.  11/24/2019, 11:48 AM

## 2019-11-24 NOTE — Anesthesia Preprocedure Evaluation (Addendum)
Anesthesia Evaluation  Patient identified by MRN, date of birth, ID band Patient awake    Reviewed: Allergy & Precautions, NPO status , Patient's Chart, lab work & pertinent test results  History of Anesthesia Complications (+) history of anesthetic complications  Airway Mallampati: II  TM Distance: >3 FB Neck ROM: Full    Dental  (+) Missing, Dental Advisory Given, Chipped Crowns :   Pulmonary Current Smoker and Patient abstained from smoking.,    Pulmonary exam normal breath sounds clear to auscultation       Cardiovascular Exercise Tolerance: Good Normal cardiovascular exam Rhythm:Regular Rate:Normal  Left ventricular ejection fraction, by estimation, is 60 to 65%. The  left ventricle has normal function. The left ventricle has no regional  wall motion abnormalities. There is mild left ventricular hypertrophy.  Left ventricular diastolic parameters  were normal.  2. Right ventricular systolic function is normal. The right ventricular  size is normal.  3. Left atrial size was mildly dilated.  4. The mitral valve is normal in structure. No evidence of mitral valve  regurgitation. No evidence of mitral stenosis.  5. The aortic valve is tricuspid. Aortic valve regurgitation is not  visualized. No aortic stenosis is present.  6. The inferior vena cava is normal in size with greater than 50%  respiratory variability, suggesting right atrial pressure of 3 mmHg.    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)  alcohol use, marijuana use, methamphetamine use and IV drug use,   Endo/Other    Renal/GU      Musculoskeletal  (+) narcotic dependentBack pain   Abdominal   Peds  Hematology   Anesthesia Other Findings   Reproductive/Obstetrics                           Anesthesia Physical Anesthesia  Plan  ASA: IV  Anesthesia Plan: General   Post-op Pain Management:    Induction: Intravenous  PONV Risk Score and Plan: 1 and TIVA  Airway Management Planned: Nasal Cannula, Natural Airway and Simple Face Mask  Additional Equipment:   Intra-op Plan:   Post-operative Plan: Extubation in OR  Informed Consent: I have reviewed the patients History and Physical, chart, labs and discussed the procedure including the risks, benefits and alternatives for the proposed anesthesia with the patient or authorized representative who has indicated his/her understanding and acceptance.     Dental advisory given  Plan Discussed with: CRNA and Surgeon  Anesthesia Plan Comments:        Anesthesia Quick Evaluation

## 2019-11-24 NOTE — Progress Notes (Addendum)
    [facility] has been requested to perform a transesophageal echocardiogram on this patient for bacteremia.  After careful review of history and examination, the risks and benefits of transesophageal echocardiogram have been explained including risks of esophageal damage, perforation (1:10,000 risk), bleeding, pharyngeal hematoma as well as other potential complications associated with conscious sedation including aspiration, arrhythmia, respiratory failure and death. Alternatives to treatment were discussed, questions were answered. Patient is willing to proceed. Did speak with nurse this AM who confirmed patient has been NPO since midnight. TEE orders already written by Dr. Harl Bowie, did adjust consent order to reflect Dr. Domenic Polite to do procedure.  Charlie Pitter, PA-C 11/24/2019 8:51 AM    Attending note:  TEE requested by primary team for evaluation of MRSA bacteremia, patient with history of IVDU.  Transthoracic echocardiogram did not identify any valvular vegetations.  Procedure rescheduled from Friday to today per discussion between Dr. Harl Bowie and anesthesia.  Patient is n.p.o. for procedure later this afternoon.  Satira Sark, M.D., F.A.C.C.

## 2019-11-25 ENCOUNTER — Encounter (HOSPITAL_COMMUNITY): Payer: Self-pay | Admitting: Cardiology

## 2019-11-25 DIAGNOSIS — M5442 Lumbago with sciatica, left side: Secondary | ICD-10-CM

## 2019-11-25 MED ORDER — ACETAMINOPHEN 325 MG PO TABS: 650.0000 mg | ORAL_TABLET | Freq: Four times a day (QID) | ORAL | 0 refills | Status: AC | PRN

## 2019-11-25 MED ORDER — METHOCARBAMOL 500 MG PO TABS: 500.0000 mg | ORAL_TABLET | Freq: Three times a day (TID) | ORAL | 0 refills | Status: AC | PRN

## 2019-11-25 MED ORDER — BUSPIRONE HCL 5 MG PO TABS: 5.0000 mg | ORAL_TABLET | Freq: Two times a day (BID) | ORAL | 0 refills | Status: AC

## 2019-11-25 MED ORDER — LINEZOLID 600 MG PO TABS
600.0000 mg | ORAL_TABLET | Freq: Two times a day (BID) | ORAL | 0 refills | Status: AC
Start: 2019-11-25 — End: 2019-12-23

## 2019-11-25 MED ORDER — TRAMADOL HCL 50 MG PO TABS: 50.0000 mg | ORAL_TABLET | Freq: Three times a day (TID) | ORAL | 0 refills | Status: AC | PRN

## 2019-11-25 NOTE — Discharge Summary (Signed)
Physician Discharge Summary  Jordan [name] JKD:[id number] DOB: 23-Jan-1991 DOA: 11/17/2019  PCP: Patient, No Pcp Per  Admit date: 11/17/2019 Discharge date: 11/25/2019  Time spent: 35 minutes  Recommendations for Outpatient Follow-up:  1. Repeat CBC to make sure patient's WBCs, hemoglobin and platelet counts are remained stable especially after been using Zyvox. 2. Repeat basic metabolic panel to follow electrolytes and renal function  Discharge Diagnoses:  Principal Problem:   Sepsis ([place]) Active Problems:   Hypokalemia   Left sided sciatica   Lactic acidosis   Hyponatremia   Tobacco abuse   IV drug abuse ([place])   Bacteremia   Acute left-sided low back pain with left-sided sciatica   Discharge Condition: Stable and improved.  Discharged home with instruction to follow-up with infectious disease service in about 4 weeks.  Patient has received a much letter through [facility] CM/SW services) to assure that he will be able to obtain the medication to treat his condition as an outpatient.  CODE STATUS: Full code  Diet recommendation: Regular diet.  Filed Weights   11/17/19 0225 11/17/19 2110 11/24/19 1422  Weight: 77.1 kg 80.5 kg 80.5 kg    History of present illness:  As per H&P written by Dr. Josephine Cables on 11/17/2019 Jordan [name] a 29 y.o.malewith medical history significant fortobacco abuse andIVDUwho presents to the emergency department due to severe burning pain in the left buttock with radiation to the left foot which started around 8 PM yesterday. Pain was rated as 10/10 on pain scale. He complained of difficulty in being able to bear weight on the leg due to severity of the pain, however he denies numbness, tingling, bowel or bladder incontinence. Patient states that the only position of comfort was kneeling on the floor with his elbows on the bed,however, on arrival at the ED, he was on his feet,though most of his weight was on his right leg. Last IV heroin use was 3-4  days ago and he injects into antecubital area. He denies chest pain, shortness of breath, fever, nausea, vomiting or abdominal pain.  ED Course: In the emergency department, initial temperature was normal at 98.73F,but this eventually increased to 127F, he was tachycardic. Work-up in the ED showed leukocytosis, sodium 131, potassium 3.2, lactic acid 2.7, SARS coronavirus 2 negative. Chest x-ray showed no active disease. MRI cervical, thoracic and lumbar spine ordered and pending. Code sepsis was initiated with IV LR per sepsis protocol and IV Vanco and cefepime ordered. IV Solu-Medrol 25 Mg x1, ibuprofen and Robaxin were given. Hospitalist was asked to admit. For further evaluation and management.  Hospital Course:  1-sepsis [facility]) present on admission: In the setting of MRSA bacteremia -Patient with positive history of IV drug use. -Lactic acidosis, WBCs and the rest of septic features essentially resolved at time of discharge. -Patient was afebrile for 48 hours and repeat blood cultures without growth for 3 days prior to discharge. -Patient was treated with daptomycin while inpatient and after 2D echo/TEE demonstrating no signs of endocarditis or vegetation decision was made to treat with Zyvox orally for 4 more weeks. -Outpatient follow-up in 4 weeks with infectious disease has been requested. -appreciate assistance and recommendations by ID service, patient will continue following the guidance and further recommendations as an outpatient.  2-Hypokalemia -Repleted and within normal limits at discharge. -Magnesium within normal limits.  3-Left sided sciatica -With ongoing acute lower back pain complaints; but much improved at time of discharge. -Patient will continue the use of Tylenol, Robaxin and tramadol for severe  pain. -Lower back CT scan and MRI without acute abnormalities; unable to rule out any deep tissue infection no seen on images at this point.  4-hyponatremia/lactic  acidosis -Resolved with fluid resuscitation -Advised to maintain adequate hydration. -Repeat basic metabolic panel follow-up visit to reassess electrolytes stability.  5-Tobacco abuse -Cessation counseling has been provided -Patient encouraged to continue the use of nicotine patch as an outpatient.  6-IV drug abuse ([place]) -Extensive cessation counseling provided. -Patient received a list of resources in the community to continue helping/assisting him keeping himself drugs free. -Drug of choice IV heroin. -Opiate withdrawal score stable for 24 hours prior to discharge. -Patient received while inside the hospital controlled amount of IV opiates to minimize symptoms and assist with pain control. -At discharge got a prescription for short amount of tramadol. -Significantly abnormal UDS that was carryover from the time of admission (demonstrating the recent use of multiple recreational agents as an outpatient)  7-anxiety -discharged on Buspar.  Procedures: See below for x-ray reports 2D echo and TEE: Demonstrating preserved ejection fraction, no vegetation and no active findings for endocarditis.  Consultations:  Cardiology service for TEE  Infectious disease (Dr. Tommy Medal)  Discharge Exam: Vitals:   11/25/19 0522 11/25/19 1553  BP: (!) 130/68 (!) 147/79  Pulse: 76 71  Resp: 14 18  Temp: 98.3 F (36.8 C) 99.1 F (37.3 C)  SpO2: 100% 100%   General exam: Alert, awake, oriented x 3, no chest pain, no nausea, no vomiting.  Continue complaining of lower back pain (but much improved, and able to ambulate now).    Has remained afebrile for over 48 hours prior to discharge.  In no acute distress. Respiratory system: Clear to auscultation. Respiratory effort normal. Cardiovascular system:RRR. No murmurs, rubs, gallops. Gastrointestinal system: Abdomen is nondistended, soft and nontender. No organomegaly or masses felt. Normal bowel sounds heard. Central nervous system: Alert and  oriented. No focal neurological deficits. Extremities: No cyanosis or clubbing. Skin: No rashes, no petechiae.  Multiple tattoos appreciated throughout his body. Psychiatry: Judgement and insight appear normal. Mood & affect appropriate.    Discharge Instructions   Discharge Instructions    Discharge instructions   Complete by: As directed    Take medications as prescribed Still the use of recreational drugs Maintain adequate hydration Stop smoking.     Allergies as of 11/25/2019   No Known Allergies     Medication List    TAKE these medications   acetaminophen 325 MG tablet Commonly known as: TYLENOL Take 2 tablets (650 mg total) by mouth every 6 (six) hours as needed for mild pain or headache (or Fever >/= 101).   busPIRone 5 MG tablet Commonly known as: BUSPAR Take 1 tablet (5 mg total) by mouth 2 (two) times daily.   linezolid 600 MG tablet Commonly known as: Zyvox Take 1 tablet (600 mg total) by mouth 2 (two) times daily for 28 days.   methocarbamol 500 MG tablet Commonly known as: ROBAXIN Take 1 tablet (500 mg total) by mouth every 8 (eight) hours as needed for muscle spasms.   traMADol 50 MG tablet Commonly known as: ULTRAM Take 1 tablet (50 mg total) by mouth every 8 (eight) hours as needed for severe pain.      No Known Allergies  Follow-up Information    Tommy Medal, Lavell Islam, MD. Schedule an appointment as soon as possible for a visit in 4 week(s).   Specialty: Infectious Diseases Contact information: [address] [place] [address]  [phone]               The results of significant diagnostics from this hospitalization (including imaging, microbiology, ancillary and laboratory) are listed below for reference.    Significant Diagnostic Studies: CT THORACIC SPINE W CONTRAST  Result Date: 11/17/2019 CLINICAL DATA:  Back pain, history of IV drug use EXAM: CT THORACIC AND LUMBAR SPINE WITH CONTRAST TECHNIQUE: Multidetector CT  imaging of the thoracic and lumbar spine was performed with contrast. Multiplanar CT image reconstructions were also generated. COMPARISON:  None. FINDINGS: CT THORACIC SPINE Alignment: Preserved kyphosis. Anteroposterior alignment is maintained. Vertebrae: Vertebral body heights are preserved. No acute fracture. No evidence of endplate erosion or suspicious lesion. Paraspinal and other soft tissues: Unremarkable. Disc levels: Intervertebral disc heights are preserved. There is no degenerative stenosis. CT LUMBAR SPINE Segmentation: 5 lumbar type vertebrae. Alignment: Preserved lordosis. Anteroposterior alignment is maintained. Vertebrae: Vertebral body heights are preserved. There is no acute fracture. No evidence of endplate erosion or suspicious lesion. Paraspinal and other soft tissues: Unremarkable. Disc levels: Intervertebral disc heights are maintained. There is no degenerative stenosis. IMPRESSION: No significant abnormality. Specifically, no evidence of discitis/osteomyelitis. Electronically Signed   By: Macy Mis M.D.   On: 11/17/2019 11:55   CT LUMBAR SPINE W CONTRAST  Result Date: 11/17/2019 CLINICAL DATA:  Back pain, history of IV drug use EXAM: CT THORACIC AND LUMBAR SPINE WITH CONTRAST TECHNIQUE: Multidetector CT imaging of the thoracic and lumbar spine was performed with contrast. Multiplanar CT image reconstructions were also generated. COMPARISON:  None. FINDINGS: CT THORACIC SPINE Alignment: Preserved kyphosis. Anteroposterior alignment is maintained. Vertebrae: Vertebral body heights are preserved. No acute fracture. No evidence of endplate erosion or suspicious lesion. Paraspinal and other soft tissues: Unremarkable. Disc levels: Intervertebral disc heights are preserved. There is no degenerative stenosis. CT LUMBAR SPINE Segmentation: 5 lumbar type vertebrae. Alignment: Preserved lordosis. Anteroposterior alignment is maintained. Vertebrae: Vertebral body heights are preserved. There  is no acute fracture. No evidence of endplate erosion or suspicious lesion. Paraspinal and other soft tissues: Unremarkable. Disc levels: Intervertebral disc heights are maintained. There is no degenerative stenosis. IMPRESSION: No significant abnormality. Specifically, no evidence of discitis/osteomyelitis. Electronically Signed   By: Macy Mis M.D.   On: 11/17/2019 11:55   MR Lumbar Spine W Wo Contrast  Result Date: 11/20/2019 CLINICAL DATA:  Left buttock and lower extremity pain. EXAM: MRI LUMBAR SPINE WITHOUT AND WITH CONTRAST TECHNIQUE: Multiplanar and multiecho pulse sequences of the lumbar spine were obtained without and with intravenous contrast. CONTRAST:  51mL GADAVIST GADOBUTROL 1 MMOL/ML IV SOLN COMPARISON:  None. FINDINGS: Segmentation:  Standard. Alignment:  Physiologic. Vertebrae:  No fracture, evidence of discitis, or bone lesion. Conus medullaris and cauda equina: Conus extends to the L1 level. Conus and cauda equina appear normal. Paraspinal and other soft tissues: Negative Disc levels: No disc herniation spinal canal stenosis or neural impingement. Normal facets. No abnormal contrast enhancement. IMPRESSION: Normal MRI of the lumbar spine. Electronically Signed   By: Ulyses Jarred M.D.   On: 11/20/2019 20:42   DG CHEST PORT 1 VIEW  Result Date: 11/19/2019 CLINICAL DATA:  29 year old male with chest pain EXAM: PORTABLE CHEST 1 VIEW COMPARISON:  Chest radiograph dated 11/17/2019 and CT dated 09/10/2019. FINDINGS: No focal consolidation, pleural effusion, or pneumothorax. The cardiac silhouette is within limits. No acute osseous pathology. IMPRESSION: No active disease. Electronically Signed   By: Anner Crete M.D.   On: 11/19/2019 22:44   DG Chest Port 1  View  Result Date: 11/17/2019 CLINICAL DATA:  Fever. EXAM: PORTABLE CHEST 1 VIEW COMPARISON:  09/19/2019 FINDINGS: The cardiomediastinal silhouette is within normal limits. Mildly coarsened interstitial markings are similar to  the prior study. No confluent airspace opacity, edema, pleural effusion, pneumothorax is identified. No acute osseous abnormality is seen. IMPRESSION: No active disease. Electronically Signed   By: Logan Bores M.D.   On: 11/17/2019 04:49   ECHOCARDIOGRAM COMPLETE  Result Date: 11/18/2019    ECHOCARDIOGRAM REPORT   Patient Name:   Jordan [name] Date of Exam: 11/18/2019 Medical Rec #:  [id number]      Height:       70.0 in Accession #:    [id number]     Weight:       177.5 lb Date of Birth:  13-Jun-1990       BSA:          1.984 m Patient Age:    29 years       BP:           156/75 mmHg Patient Gender: M              HR:           100 bpm. Exam Location:  [facility] Procedure: 2D Echo, Cardiac Doppler and Color Doppler Indications:    Bacteremia 790.7 / R78.81  History:        Patient has no prior history of Echocardiogram examinations.                 Risk Factors:Current Smoker. IV drug abuse, Hypokalemia, Sepsis.  Sonographer:    Alvino Chapel RCS Referring Phys: [phone] [place]  1. Left ventricular ejection fraction, by estimation, is 60 to 65%. The left ventricle has normal function. The left ventricle has no regional wall motion abnormalities. There is mild left ventricular hypertrophy. Left ventricular diastolic parameters were normal.  2. Right ventricular systolic function is normal. The right ventricular size is normal.  3. Left atrial size was mildly dilated.  4. The mitral valve is normal in structure. No evidence of mitral valve regurgitation. No evidence of mitral stenosis.  5. The aortic valve is tricuspid. Aortic valve regurgitation is not visualized. No aortic stenosis is present.  6. The inferior vena cava is normal in size with greater than 50% respiratory variability, suggesting right atrial pressure of 3 mmHg. FINDINGS  Left Ventricle: Left ventricular ejection fraction, by estimation, is 60 to 65%. The left ventricle has normal function. The left ventricle has no regional  wall motion abnormalities. The left ventricular internal cavity size was normal in size. There is  mild left ventricular hypertrophy. Left ventricular diastolic parameters were normal. Right Ventricle: The right ventricular size is normal. No increase in right ventricular wall thickness. Right ventricular systolic function is normal. Left Atrium: Left atrial size was mildly dilated. Right Atrium: Right atrial size was normal in size. Pericardium: There is no evidence of pericardial effusion. Mitral Valve: The mitral valve is normal in structure. No evidence of mitral valve regurgitation. No evidence of mitral valve stenosis. Tricuspid Valve: The tricuspid valve is normal in structure. Tricuspid valve regurgitation is not demonstrated. No evidence of tricuspid stenosis. Aortic Valve: The aortic valve is tricuspid. Aortic valve regurgitation is not visualized. No aortic stenosis is present. Aortic valve mean gradient measures 5.2 mmHg. Aortic valve peak gradient measures 10.1 mmHg. Aortic valve area, by VTI measures 3.46  cm. Pulmonic Valve: The pulmonic valve was not well  visualized. Pulmonic valve regurgitation is not visualized. No evidence of pulmonic stenosis. Aorta: The aortic root is normal in size and structure. Pulmonary Artery: Indeterminant PASP, inadequate TR jet. Venous: The inferior vena cava is normal in size with greater than 50% respiratory variability, suggesting right atrial pressure of 3 mmHg. IAS/Shunts: No atrial level shunt detected by color flow Doppler.  LEFT VENTRICLE PLAX 2D LVIDd:         5.39 cm  Diastology LVIDs:         3.74 cm  LV e' lateral:   20.70 cm/s LV PW:         1.07 cm  LV E/e' lateral: 6.1 LV IVS:        1.00 cm  LV e' medial:    13.30 cm/s LVOT diam:     2.30 cm  LV E/e' medial:  9.5 LV SV:         100 LV SV Index:   50 LVOT Area:     4.15 cm  RIGHT VENTRICLE RV S prime:     21.00 cm/s TAPSE (M-mode): 2.5 cm LEFT ATRIUM             Index       RIGHT ATRIUM           Index  LA diam:        3.50 cm 1.76 cm/m  RA Area:     18.70 cm LA Vol (A2C):   71.6 ml 36.09 ml/m RA Volume:   54.20 ml  27.32 ml/m LA Vol (A4C):   81.1 ml 40.88 ml/m LA Biplane Vol: 77.5 ml 39.06 ml/m  AORTIC VALVE AV Area (Vmax):    3.42 cm AV Area (Vmean):   3.13 cm AV Area (VTI):     3.46 cm AV Vmax:           159.28 cm/s AV Vmean:          104.051 cm/s AV VTI:            0.289 m AV Peak Grad:      10.1 mmHg AV Mean Grad:      5.2 mmHg LVOT Vmax:         131.00 cm/s LVOT Vmean:        78.400 cm/s LVOT VTI:          0.241 m LVOT/AV VTI ratio: 0.83  AORTA Ao Root diam: 3.00 cm MITRAL VALVE MV Area (PHT): 5.06 cm     SHUNTS MV Decel Time: 150 msec     Systemic VTI:  0.24 m MV E velocity: 127.00 cm/s  Systemic Diam: 2.30 cm MV A velocity: 56.30 cm/s MV E/A ratio:  2.26 Carlyle Dolly MD Electronically signed by Carlyle Dolly MD Signature Date/Time: 11/18/2019/4:49:21 PM    Final    ECHO TEE  Result Date: 11/24/2019    TRANSESOPHOGEAL ECHO REPORT   Patient Name:   Jordan [name] Date of Exam: 11/24/2019 Medical Rec #:  [id number]      Height:       70.0 in Accession #:    [id number]     Weight:       177.5 lb Date of Birth:  1990-07-16       BSA:          1.984 m Patient Age:    29 years       BP:           136/83 mmHg Patient Gender: M  HR:           96 bpm. Exam Location:  [facility] Procedure: Transesophageal Echo, Cardiac Doppler and Color Doppler Indications:    Bacteremia  History:        Patient has prior history of Echocardiogram examinations, most                 recent 11/18/2019. Risk Factors:Current Smoker. IV drug abuse,                 Hypokalemia, Sepsis.  Sonographer:    Alvino Chapel RCS Referring Phys: [id number] [place]: TEE procedure time was 8 minutes. The transesophogeal probe was passed without difficulty through the esophogus of the patient. Imaged were obtained with the patient in a left lateral decubitus position. Sedation performed by different physician.  The patient was monitored while under deep sedation. Image quality was excellent. The patient developed no complications during the procedure. IMPRESSIONS  1. Left ventricular ejection fraction, by estimation, is 55 to 60%. The left ventricle has normal function. The left ventricle has no regional wall motion abnormalities.  2. Right ventricular systolic function is normal. The right ventricular size is normal.  3. No left atrial/left atrial appendage thrombus was detected. The LAA emptying velocity was 80 cm/s.  4. The mitral valve is grossly normal. Trivial mitral valve regurgitation.  5. The aortic valve is tricuspid. Aortic valve regurgitation is not visualized.  6. Normal descending aorta.  7. No valvular vegetations. FINDINGS  Left Ventricle: Left ventricular ejection fraction, by estimation, is 55 to 60%. The left ventricle has normal function. The left ventricle has no regional wall motion abnormalities. The left ventricular internal cavity size was normal in size. There is  no left ventricular hypertrophy. Right Ventricle: The right ventricular size is normal. No increase in right ventricular wall thickness. Right ventricular systolic function is normal. Left Atrium: Left atrial size was normal in size. No left atrial/left atrial appendage thrombus was detected. The LAA emptying velocity was 80 cm/s. Right Atrium: Right atrial size was normal in size. Pericardium: There is no evidence of pericardial effusion. Mitral Valve: The mitral valve is grossly normal. Trivial mitral valve regurgitation. Tricuspid Valve: The tricuspid valve is grossly normal. Tricuspid valve regurgitation is trivial. Aortic Valve: The aortic valve is tricuspid. Aortic valve regurgitation is not visualized. Pulmonic Valve: The pulmonic valve was grossly normal. Pulmonic valve regurgitation is trivial. Aorta: Normal descending aorta. The aortic root is normal in size and structure. IAS/Shunts: No atrial level shunt detected by color  flow Doppler. Rozann Lesches MD Electronically signed by Rozann Lesches MD Signature Date/Time: 11/24/2019/4:14:09 PM    Final     Microbiology: Recent Results (from the past 240 hour(s))  SARS Coronavirus 2 by RT PCR (hospital order, performed in [facility] hospital lab) Nasopharyngeal Nasopharyngeal Swab     Status: None   Collection Time: 11/17/19  4:02 AM   Specimen: Nasopharyngeal Swab  Result Value Ref Range Status   SARS Coronavirus 2 NEGATIVE NEGATIVE Final    Comment: (NOTE) SARS-CoV-2 target nucleic acids are NOT DETECTED.  The SARS-CoV-2 RNA is generally detectable in upper and lower respiratory specimens during the acute phase of infection. The lowest concentration of SARS-CoV-2 viral copies this assay can detect is 250 copies / mL. A negative result does not preclude SARS-CoV-2 infection and should not be used as the sole basis for treatment or other patient management decisions.  A negative result may occur with improper specimen collection /  handling, submission of specimen other than nasopharyngeal swab, presence of viral mutation(s) within the areas targeted by this assay, and inadequate number of viral copies (<250 copies / mL). A negative result must be combined with clinical observations, patient history, and epidemiological information.  Fact Sheet for Patients:   StrictlyIdeas.no  Fact Sheet for Healthcare Providers: BankingDealers.co.za  This test is not yet approved or  cleared by the [place] FDA and has been authorized for detection and/or diagnosis of SARS-CoV-2 by FDA under an Emergency Use Authorization (EUA).  This EUA will remain in effect (meaning this test can be used) for the duration of the COVID-19 declaration under Section 564(b)(1) of the Act, 21 U.S.C. section 360bbb-3(b)(1), unless the authorization is terminated or revoked sooner.  Performed at [facility], [address]  [place], [place] [address]   Blood Culture (routine x 2)     Status: Abnormal   Collection Time: 11/17/19  4:17 AM   Specimen: Right Antecubital; Blood  Result Value Ref Range Status   Specimen Description   Final    RIGHT ANTECUBITAL Performed at [facility], [address] [place], [place] [address]    Special Requests   Final    BOTTLES DRAWN AEROBIC AND ANAEROBIC Blood Culture adequate volume Performed at [facility], [address], [place], [place] [address]    Culture  Setup Time   Final    GRAM POSITIVE COCCI IN BOTH AEROBIC AND ANAEROBIC BOTTLES Gram Stain Report Called to,Read Back By and Verified With: KATYLNNELLIS,RN @1615  11/17/2019 KAY AEROBIC BOTTLE ALSO    Culture (A)  Final    STAPHYLOCOCCUS AUREUS SUSCEPTIBILITIES PERFORMED ON PREVIOUS CULTURE WITHIN THE LAST 5 DAYS. Performed at [facility], [place] [address], [place], [place] [address]    Report Status 11/19/2019 FINAL  Final  Blood Culture (routine x 2)     Status: Abnormal   Collection Time: 11/17/19  4:20 AM   Specimen: BLOOD LEFT ARM  Result Value Ref Range Status   Specimen Description   Final    BLOOD LEFT ARM Performed at [facility], [address], [place], [place] [address]    Special Requests   Final    BOTTLES DRAWN AEROBIC AND ANAEROBIC Blood Culture adequate volume Performed at [facility], [address], [place], [place] [address]    Culture  Setup Time   Final    GRAM POSITIVE COCCI IN BOTH AEROBIC AND ANAEROBIC BOTTLES Gram Stain Report Called to,Read Back By and Verified With: KATYLNN ELLIS,RN @1615   11/17/2019 KAY CRITICAL RESULT CALLED TO, READ BACK BY AND VERIFIED WITHErie Noe RN 2236 11/17/19 A BROWNING Performed at [facility], [place] [address], [place], [place] [address]    Culture METHICILLIN RESISTANT STAPHYLOCOCCUS AUREUS (A)  Final   Report Status 11/19/2019 FINAL  Final   Organism ID, Bacteria METHICILLIN RESISTANT STAPHYLOCOCCUS AUREUS  Final      Susceptibility    Methicillin resistant staphylococcus aureus - MIC*    CIPROFLOXACIN >=8 RESISTANT Resistant     ERYTHROMYCIN >=8 RESISTANT Resistant     GENTAMICIN <=0.5 SENSITIVE Sensitive     OXACILLIN >=4 RESISTANT Resistant     TETRACYCLINE <=1 SENSITIVE Sensitive     VANCOMYCIN <=0.5 SENSITIVE Sensitive     TRIMETH/SULFA >=320 RESISTANT Resistant     CLINDAMYCIN >=8 RESISTANT Resistant     RIFAMPIN <=0.5 SENSITIVE Sensitive     Inducible Clindamycin NEGATIVE Sensitive     * METHICILLIN RESISTANT STAPHYLOCOCCUS AUREUS  Blood Culture ID Panel (Reflexed)     Status: Abnormal   Collection Time: 11/17/19  4:20 AM  Result Value Ref Range Status   Enterococcus species NOT DETECTED NOT DETECTED Final   Listeria monocytogenes NOT DETECTED NOT DETECTED Final   Staphylococcus species DETECTED (A) NOT DETECTED Final    Comment: CRITICAL RESULT CALLED TO, READ BACK BY AND VERIFIED WITH: R DAWAY RN 2236 11/17/19 A BROWNING    Staphylococcus aureus (BCID) DETECTED (A) NOT DETECTED Final    Comment: Methicillin (oxacillin)-resistant Staphylococcus aureus (MRSA). MRSA is predictably resistant to beta-lactam antibiotics (except ceftaroline). Preferred therapy is vancomycin unless clinically contraindicated. Patient requires contact precautions if  hospitalized. CRITICAL RESULT CALLED TO, READ BACK BY AND VERIFIED WITH: R DAWAY RN 2236 11/17/19 A BROWNING    Methicillin resistance DETECTED (A) NOT DETECTED Final    Comment: CRITICAL RESULT CALLED TO, READ BACK BY AND VERIFIED WITH: R DAWAY RN 2236 11/17/19 A BROWNING    Streptococcus species NOT DETECTED NOT DETECTED Final   Streptococcus agalactiae NOT DETECTED NOT DETECTED Final   Streptococcus pneumoniae NOT DETECTED NOT DETECTED Final   Streptococcus pyogenes NOT DETECTED NOT DETECTED Final   Acinetobacter baumannii NOT DETECTED NOT DETECTED Final   Enterobacteriaceae species NOT DETECTED NOT DETECTED Final   Enterobacter cloacae complex NOT DETECTED NOT  DETECTED Final   Escherichia coli NOT DETECTED NOT DETECTED Final   Klebsiella oxytoca NOT DETECTED NOT DETECTED Final   Klebsiella pneumoniae NOT DETECTED NOT DETECTED Final   Proteus species NOT DETECTED NOT DETECTED Final   Serratia marcescens NOT DETECTED NOT DETECTED Final   Haemophilus influenzae NOT DETECTED NOT DETECTED Final   Neisseria meningitidis NOT DETECTED NOT DETECTED Final   Pseudomonas aeruginosa NOT DETECTED NOT DETECTED Final   Candida albicans NOT DETECTED NOT DETECTED Final   Candida glabrata NOT DETECTED NOT DETECTED Final   Candida krusei NOT DETECTED NOT DETECTED Final   Candida parapsilosis NOT DETECTED NOT DETECTED Final   Candida tropicalis NOT DETECTED NOT DETECTED Final    Comment: Performed at [facility], [place]. [address] [place], [place] [address]  Urine culture     Status: None   Collection Time: 11/17/19  8:09 PM   Specimen: In/Out Cath Urine  Result Value Ref Range Status   Specimen Description   Final    IN/OUT CATH URINE Performed at [facility], [address] [place], [place] [address]    Special Requests   Final    NONE Performed at [facility], [address], [place], [place] [address]    Culture   Final    NO GROWTH Performed at [facility], [place] [address], [place], [place] [address]    Report Status 11/19/2019 FINAL  Final  Culture, blood (Routine X 2) w Reflex to ID Panel     Status: None (Preliminary result)   Collection Time: 11/22/19  1:03 PM   Specimen: Right Antecubital; Blood  Result Value Ref Range Status   Specimen Description   Final    RIGHT ANTECUBITAL BOTTLES DRAWN AEROBIC AND ANAEROBIC   Special Requests Blood Culture adequate volume  Final   Culture   Final    NO GROWTH 3 DAYS Performed at [facility], [address], [place], [place] [address]    Report Status PENDING  Incomplete  Culture, blood (Routine X 2) w Reflex to ID Panel     Status: None (Preliminary result)   Collection Time: 11/22/19   1:38 PM  Specimen: BLOOD LEFT ARM  Result Value Ref Range Status   Specimen Description BLOOD LEFT ARM BOTTLES DRAWN AEROBIC AND ANAEROBIC  Final   Special Requests Blood Culture adequate volume  Final   Culture   Final    NO GROWTH 3 DAYS Performed at [facility], [address] [place], [place] [address]    Report Status PENDING  Incomplete     Labs: Basic Metabolic Panel: Recent Labs  Lab 11/19/19 0543 11/19/19 0543 11/20/19 0000 11/20/19 0528 11/21/19 0419 11/22/19 0609 11/23/19 0615 11/24/19 0507  NA 139  --   --  137  --  139 134*  --   K 3.4*  --   --  3.6  --  3.7 3.1*  --   CL 104  --   --  104  --  105 101  --   CO2 25  --   --  24  --  25 25  --   GLUCOSE 108*  --   --  101*  --  94 122*  --   BUN <5*  --   --  8  --  14 9  --   CREATININE 0.61   < >  --  0.63 0.58* 0.64 0.68 0.75  CALCIUM 8.8*  --   --  8.8*  --  8.5* 8.3*  --   MG 1.9  --  2.0  --   --   --  2.1  --   PHOS  --   --  2.7  --   --   --   --   --    < > = values in this interval not displayed.   Liver Function Tests: Recent Labs  Lab 11/19/19 0543  AST 14*  ALT 14  ALKPHOS 94  BILITOT 0.6  PROT 6.7  ALBUMIN 3.0*   CBC: Recent Labs  Lab 11/20/19 0000 11/20/19 0528 11/22/19 0609 11/23/19 0615  WBC 15.8* 14.9* 13.2* 11.8*  HGB 11.5* 11.6* 11.0* 10.9*  HCT 35.5* 36.5* 34.5* 35.0*  MCV 82.9 83.5 83.5 85.0  PLT 317 333 457* 463*   Cardiac Enzymes: Recent Labs  Lab 11/24/19 0507  CKTOTAL 50     Signed:  Barton Dubois MD.  [facility] 11/25/2019, 4:17 PM

## 2019-11-25 NOTE — Progress Notes (Signed)
IV removed from left AC, 2x2 gauze and paper tape applied to site, patient tolerated well.  Reviewed AVS with patient who verbalized understanding. Patient refused wheelchair and walked himself downstairs to the front lobby.  Patient transported home by an [place].

## 2019-11-25 NOTE — Progress Notes (Signed)
      [facility] Antimicrobial Management Team Staphylococcus aureus bacteremia   Staphylococcus aureus bacteremia (SAB) is associated with a high rate of complications and mortality.  Specific aspects of clinical management are critical to optimizing the outcome of patients with SAB.  Therefore, the [facility] Antimicrobial Management Team [facility]) has initiated an intervention aimed at improving the management of SAB at [facility].  To do so, Infectious Diseases physicians are providing an evidence-based consult for the management of all patients with SAB.     Yes No Comments  Perform follow-up blood cultures (even if the patient is afebrile) to ensure clearance of bacteremia [x]  []  NO GROWTH  Remove vascular catheter and obtain follow-up blood cultures after the removal of the catheter [x]  []  DO NOT place PICC or central line  Perform echocardiography to evaluate for endocarditis (transthoracic ECHO is 40-50% sensitive, TEE is > 90% sensitive) [x]  []  TEE negative  Consult electrophysiologist to evaluate implanted cardiac device (pacemaker, ICD) []  []    Ensure source control [x]  []  Have all abscesses been drained effectively? Have deep seeded infections (septic joints or osteomyelitis) had appropriate surgical debridement?  Investigate for "metastatic" sites of infection [x]  []  Does the patient have ANY symptom or physical exam finding that would suggest a deeper infection (back or neck pain that may be suggestive of vertebral osteomyelitis or epidural abscess, muscle pain that could be a symptom of pyomyositis)?  Keep in mind that for deep seeded infections MRI imaging with contrast is preferred rather than other often insensitive tests such as plain x-rays, especially early in a patient's presentation.  MRI L spine is negative  Change antibiotic therapy to vancomycin []  []  Beta-lactam antibiotics are preferred for MSSA due to higher cure rates.   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  Because he had an ankle bracelet them house arrest MRI was not able to be performed.  CT scans of the thoracic and lumbar spine were unrevealing for infection.  Blood cultures on admission however been positive.  Chest x-ray is negative.  I discussed his case with Dr. Dyann Kief I wonder if the patient could have deep infection in the spine that is just not showing up on CT and may not show up on MRI either.   Patients TTE negative. TEE negative MRI L spine negative  Would send out with 4 weeks of po zyvox    Case discussed with Dr. Dyann Kief

## 2019-11-25 NOTE — Progress Notes (Signed)
Physical Therapy Treatment Patient Details Name: Jordan [name] MRN: [id number] DOB: 04/16/1991 Today's Date: 11/25/2019    History of Present Illness 29 yo male presenting to ED with severe burning pain in the left buttock with radiation to the left foot. Pt reports difficulty weight bearing on left leg due to severity of pain. Past medical history significant for tobacco abuse and IVDU.     PT Comments    Pt tolerates gait training this session, initially using step to pattern with limited LLE weight-bearing, but able to improve with cues and distance to more step-through pattern and weight-shift to LLE with BUE assisting through RW. Pt maintains L knee flexion in stance, educated pt on improving and provided quad set exercise to improve stance phase of gait. Pt attempts to pull on RW to rise, educated to push from seated surface with better technique and able to perform without assistance. Pt ambulates to restroom without AD, completes toileting independently, and able to release hands from RW to wash hands at sink with good steadiness. Notable increase in limp with avoiding LLE weight-bearing when attempting gait without AD, so educated on benefit of RW to reduce LLE weight-bearing and reduce risk for falls with good pt understanding. RN notified of pt requesting pain medication. Patient will benefit from continued physical therapy in hospital and recommendations below to increase strength, balance, endurance for safe ADLs and gait.    Follow Up Recommendations  Outpatient PT     Equipment Recommendations  Rolling walker with 5" wheels    Recommendations for Other Services       Precautions / Restrictions Precautions Precautions: Fall Restrictions Weight Bearing Restrictions: No    Mobility  Bed Mobility Overal bed mobility: Modified Independent  General bed mobility comments: increased time with increased pain, no physical assist, use of bedrails to fully upright  trunk  Transfers Overall transfer level: Needs assistance Equipment used: Rolling walker (2 wheeled) Transfers: Sit to/from Stand Sit to Stand: Supervision;Modified independent (Device/Increase time)  General transfer comment: initially SUPV requiring cues for hand placement, progress to modified independent with good carryover of hand placement  Ambulation/Gait Ambulation/Gait assistance: Supervision Gait Distance (Feet): 80 Feet Assistive device: Rolling walker (2 wheeled) Gait Pattern/deviations: Step-to pattern;Decreased stance time - left;Decreased stride length;Decreased weight shift to left;Antalgic;Narrow base of support Gait velocity: decreased   General Gait Details: narrow gait pattern maintaining L knee flexion in stance despite cues, increased pain causing decreased weight-shift L and shorter bil step length; cued to progress to step through pattern with minimal improvement before returning to normal cadence. attempted ambulation in room without AD with notable limp avoiding LLE weight-shift so educated on benefit of RW to reduce LLE pain and reduce risk for falls with good understanding   Stairs             Wheelchair Mobility    Modified Rankin (Stroke Patients Only)       Balance Overall balance assessment: Needs assistance Sitting-balance support: Feet supported;No upper extremity supported Sitting balance-Leahy Scale: Good Sitting balance - Comments: seated at EOB   Standing balance support: During functional activity;Bilateral upper extremity supported Standing balance-Leahy Scale: Fair Standing balance comment: fair/good with RW, fair without RW       Cognition Arousal/Alertness: Awake/alert Behavior During Therapy: WFL for tasks assessed/performed Overall Cognitive Status: Within Functional Limits for tasks assessed       Exercises General Exercises - Lower Extremity Quad Sets: AROM;Strengthening;Left;10 reps    General Comments  Pertinent Vitals/Pain Pain Assessment: 0-10 Pain Score: 7  Pain Location: low back into L buttock Pain Descriptors / Indicators:  ("painful") Pain Intervention(s): Limited activity within patient's tolerance;Monitored during session;Repositioned;Patient requesting pain meds-RN notified    Home Living                      Prior Function            PT Goals (current goals can now be found in the care plan section) Acute Rehab PT Goals Patient Stated Goal: walk without LLE pain PT Goal Formulation: With patient Time For Goal Achievement: 12/02/19 Potential to Achieve Goals: Good    Frequency    Min 3X/week      PT Plan Current plan remains appropriate    Co-evaluation              AM-PAC PT "6 Clicks" Mobility   Outcome Measure  Help needed turning from your back to your side while in a flat bed without using bedrails?: None Help needed moving from lying on your back to sitting on the side of a flat bed without using bedrails?: None Help needed moving to and from a bed to a chair (including a wheelchair)?: A Little Help needed standing up from a chair using your arms (e.g., wheelchair or bedside chair)?: A Little Help needed to walk in hospital room?: A Little Help needed climbing 3-5 steps with a railing? : A Lot 6 Click Score: 19    End of Session   Activity Tolerance: Patient tolerated treatment well;Patient limited by pain Patient left: in bed;with call bell/phone within reach Nurse Communication: Mobility status;Other (comment) (requesting pain medication) PT Visit Diagnosis: Unsteadiness on feet (R26.81);Other abnormalities of gait and mobility (R26.89);Muscle weakness (generalized) (M62.81)     Time: 5427-0623 PT Time Calculation (min) (ACUTE ONLY): 24 min  Charges:  $Gait Training: 8-22 mins $Therapeutic Activity: 8-22 mins                      Tori Jaleil Renwick PT, DPT 11/25/19, 10:04 AM [phone]

## 2019-11-25 NOTE — TOC Progression Note (Addendum)
Transition of Care [facility]) - Progression Note    Patient Details  Name: Jordan [name] MRN: [id number] Date of Birth: 15-Jul-1990  [facility] of [facility]) CM/SW Contact  Boneta Lucks, RN Phone Number: 11/25/2019, 3:31 PM  Clinical Narrative:  Pharmacy is working with [facility] on Zyvox. Patient is ready to discharge home. TOC provided [place] voucher with Pharmacy list. Our pharmacy confirmed the [facility] has the medication in stock. TOC informed the patient. PCP also provided.  Expected Discharge Plan: Home/Self Care Barriers to Discharge: Continued Medical Work up  Expected Discharge Plan and Services Expected Discharge Plan: Home/Self Care       Living arrangements for the past 2 months: [place]

## 2019-11-27 LAB — CULTURE, BLOOD (ROUTINE X 2)
Culture: NO GROWTH
Culture: NO GROWTH
Special Requests: ADEQUATE
Special Requests: ADEQUATE

## 2019-12-12 ENCOUNTER — Inpatient Hospital Stay (HOSPITAL_COMMUNITY)
Admission: EM | Admit: 2019-12-12 | Discharge: 2019-12-14 | DRG: 540 | Discharge status: Against Medical Advice | Payer: Self-pay | Attending: Internal Medicine | Admitting: Internal Medicine

## 2019-12-12 ENCOUNTER — Other Ambulatory Visit: Payer: Self-pay

## 2019-12-12 ENCOUNTER — Emergency Department (HOSPITAL_COMMUNITY): Payer: Self-pay

## 2019-12-12 ENCOUNTER — Encounter (HOSPITAL_COMMUNITY): Payer: Self-pay

## 2019-12-12 DIAGNOSIS — Z5329 Procedure and treatment not carried out because of patient's decision for other reasons: Secondary | ICD-10-CM | POA: Diagnosis present

## 2019-12-12 DIAGNOSIS — F1910 Other psychoactive substance abuse, uncomplicated: Secondary | ICD-10-CM

## 2019-12-12 DIAGNOSIS — F1410 Cocaine abuse, uncomplicated: Secondary | ICD-10-CM | POA: Diagnosis present

## 2019-12-12 DIAGNOSIS — D169 Benign neoplasm of bone and articular cartilage, unspecified: Secondary | ICD-10-CM

## 2019-12-12 DIAGNOSIS — M009 Pyogenic arthritis, unspecified: Secondary | ICD-10-CM

## 2019-12-12 DIAGNOSIS — M86159 Other acute osteomyelitis, unspecified femur: Secondary | ICD-10-CM | POA: Diagnosis present

## 2019-12-12 DIAGNOSIS — E872 Acidosis, unspecified: Secondary | ICD-10-CM | POA: Diagnosis present

## 2019-12-12 DIAGNOSIS — Z20822 Contact with and (suspected) exposure to covid-19: Secondary | ICD-10-CM | POA: Diagnosis present

## 2019-12-12 DIAGNOSIS — M8618 Other acute osteomyelitis, other site: Principal | ICD-10-CM | POA: Diagnosis present

## 2019-12-12 DIAGNOSIS — M7918 Myalgia, other site: Secondary | ICD-10-CM

## 2019-12-12 DIAGNOSIS — M86152 Other acute osteomyelitis, left femur: Secondary | ICD-10-CM

## 2019-12-12 DIAGNOSIS — Z720 Tobacco use: Secondary | ICD-10-CM | POA: Diagnosis present

## 2019-12-12 DIAGNOSIS — B9562 Methicillin resistant Staphylococcus aureus infection as the cause of diseases classified elsewhere: Secondary | ICD-10-CM

## 2019-12-12 DIAGNOSIS — R7881 Bacteremia: Secondary | ICD-10-CM | POA: Diagnosis present

## 2019-12-12 DIAGNOSIS — F17200 Nicotine dependence, unspecified, uncomplicated: Secondary | ICD-10-CM | POA: Diagnosis present

## 2019-12-12 LAB — RAPID URINE DRUG SCREEN, HOSP PERFORMED
Amphetamines: POSITIVE — AB
Barbiturates: NOT DETECTED
Benzodiazepines: NOT DETECTED
Cocaine: POSITIVE — AB
Opiates: NOT DETECTED
Tetrahydrocannabinol: POSITIVE — AB

## 2019-12-12 LAB — COMPREHENSIVE METABOLIC PANEL
ALT: 13 U/L (ref 0–44)
AST: 18 U/L (ref 15–41)
Albumin: 3.1 g/dL — ABNORMAL LOW (ref 3.5–5.0)
Alkaline Phosphatase: 104 U/L (ref 38–126)
Anion gap: 7 (ref 5–15)
BUN: 8 mg/dL (ref 6–20)
CO2: 24 mmol/L (ref 22–32)
Calcium: 8.5 mg/dL — ABNORMAL LOW (ref 8.9–10.3)
Chloride: 107 mmol/L (ref 98–111)
Creatinine, Ser: 0.74 mg/dL (ref 0.61–1.24)
GFR calc Af Amer: >60 mL/min (ref >60)
GFR calc non Af Amer: >60 mL/min (ref >60)
Glucose, Bld: 107 mg/dL — ABNORMAL HIGH (ref 70–99)
Potassium: 3.5 mmol/L (ref 3.5–5.1)
Sodium: 138 mmol/L (ref 135–145)
Total Bilirubin: 0.1 mg/dL — ABNORMAL LOW (ref 0.3–1.2)
Total Protein: 8.2 g/dL — ABNORMAL HIGH (ref 6.5–8.1)

## 2019-12-12 LAB — CBC WITH DIFFERENTIAL/PLATELET
Abs Immature Granulocytes: 0.07 10*3/uL (ref 0.00–0.07)
Basophils Absolute: 0 10*3/uL (ref 0.0–0.1)
Basophils Relative: 0 %
Eosinophils Absolute: 0.8 10*3/uL — ABNORMAL HIGH (ref 0.0–0.5)
Eosinophils Relative: 6 %
HCT: 33.2 % — ABNORMAL LOW (ref 39.0–52.0)
Hemoglobin: 10.5 g/dL — ABNORMAL LOW (ref 13.0–17.0)
Immature Granulocytes: 1 %
Lymphocytes Relative: 16 %
Lymphs Abs: 2.1 10*3/uL (ref 0.7–4.0)
MCH: 25.8 pg — ABNORMAL LOW (ref 26.0–34.0)
MCHC: 31.6 g/dL (ref 30.0–36.0)
MCV: 81.6 fL (ref 80.0–100.0)
Monocytes Absolute: 1.7 10*3/uL — ABNORMAL HIGH (ref 0.1–1.0)
Monocytes Relative: 13 %
Neutro Abs: 8.3 10*3/uL — ABNORMAL HIGH (ref 1.7–7.7)
Neutrophils Relative %: 64 %
Platelets: 390 10*3/uL (ref 150–400)
RBC: 4.07 MIL/uL — ABNORMAL LOW (ref 4.22–5.81)
RDW: 13.8 % (ref 11.5–15.5)
WBC: 13 10*3/uL — ABNORMAL HIGH (ref 4.0–10.5)
nRBC: 0 % (ref 0.0–0.2)

## 2019-12-12 LAB — SEDIMENTATION RATE: Sed Rate: 89 mm/hr — ABNORMAL HIGH (ref 0–16)

## 2019-12-12 LAB — LACTIC ACID, PLASMA
Lactic Acid, Venous: 2 mmol/L (ref 0.5–1.9)
Lactic Acid, Venous: 1 mmol/L (ref 0.5–1.9)

## 2019-12-12 LAB — C-REACTIVE PROTEIN: CRP: 12.7 mg/dL — ABNORMAL HIGH (ref <1.0)

## 2019-12-12 LAB — SARS CORONAVIRUS 2 BY RT PCR (HOSPITAL ORDER, PERFORMED IN ~~LOC~~ HOSPITAL LAB): SARS Coronavirus 2: NEGATIVE

## 2019-12-12 MED ORDER — VANCOMYCIN HCL IN DEXTROSE 1-5 GM/200ML-% IV SOLN
1000.0000 mg | Freq: Once | INTRAVENOUS | Status: AC
  Administered 2019-12-12: 1000 mg via INTRAVENOUS
  Filled 2019-12-12: qty 200

## 2019-12-12 MED ORDER — HYDROCODONE-ACETAMINOPHEN 5-325 MG PO TABS
1.0000 | ORAL_TABLET | Freq: Four times a day (QID) | ORAL | Status: DC | PRN
  Administered 2019-12-12: 1 via ORAL
  Filled 2019-12-12: qty 1

## 2019-12-12 MED ORDER — ONDANSETRON HCL 4 MG/2ML IJ SOLN: 4.0000 mg | Freq: Four times a day (QID) | INTRAMUSCULAR | Status: DC | PRN

## 2019-12-12 MED ORDER — HYDROCODONE-ACETAMINOPHEN 5-325 MG PO TABS
1.0000 | ORAL_TABLET | Freq: Once | ORAL | Status: AC
  Administered 2019-12-12: 1 via ORAL
  Filled 2019-12-12: qty 1

## 2019-12-12 MED ORDER — ONDANSETRON HCL 4 MG PO TABS: 4.0000 mg | ORAL_TABLET | Freq: Four times a day (QID) | ORAL | Status: DC | PRN

## 2019-12-12 MED ORDER — LORAZEPAM 0.5 MG PO TABS
0.5000 mg | ORAL_TABLET | Freq: Four times a day (QID) | ORAL | Status: DC | PRN
  Administered 2019-12-12 – 2019-12-13 (×4): 0.5 mg via ORAL
  Filled 2019-12-12 (×4): qty 1

## 2019-12-12 MED ORDER — ACETAMINOPHEN 325 MG PO TABS
650.0000 mg | ORAL_TABLET | Freq: Once | ORAL | Status: AC
  Administered 2019-12-12: 650 mg via ORAL
  Filled 2019-12-12: qty 2

## 2019-12-12 MED ORDER — VANCOMYCIN HCL 1250 MG/250ML IV SOLN
1250.0000 mg | Freq: Two times a day (BID) | INTRAVENOUS | Status: DC
  Administered 2019-12-12 – 2019-12-13 (×2): 1250 mg via INTRAVENOUS
  Filled 2019-12-12 (×2): qty 250

## 2019-12-12 MED ORDER — IOHEXOL 300 MG/ML  SOLN
100.0000 mL | Freq: Once | INTRAMUSCULAR | Status: AC | PRN
  Administered 2019-12-12: 100 mL via INTRAVENOUS

## 2019-12-12 MED ORDER — ENOXAPARIN SODIUM 40 MG/0.4ML ~~LOC~~ SOLN
40.0000 mg | SUBCUTANEOUS | Status: DC
  Administered 2019-12-12 – 2019-12-13 (×2): 40 mg via SUBCUTANEOUS
  Filled 2019-12-12 (×2): qty 0.4

## 2019-12-12 MED ORDER — SODIUM CHLORIDE 0.9 % IV BOLUS
1000.0000 mL | Freq: Once | INTRAVENOUS | Status: AC
  Administered 2019-12-12: 1000 mL via INTRAVENOUS

## 2019-12-12 MED ORDER — HYDROCODONE-ACETAMINOPHEN 5-325 MG PO TABS
1.0000 | ORAL_TABLET | Freq: Four times a day (QID) | ORAL | Status: DC | PRN
  Administered 2019-12-13 (×2): 1 via ORAL
  Filled 2019-12-12 (×2): qty 1

## 2019-12-12 MED ORDER — ACETAMINOPHEN 650 MG RE SUPP: 650.0000 mg | Freq: Four times a day (QID) | RECTAL | Status: DC | PRN

## 2019-12-12 MED ORDER — KETOROLAC TROMETHAMINE 30 MG/ML IJ SOLN
30.0000 mg | Freq: Once | INTRAMUSCULAR | Status: AC
  Administered 2019-12-12: 30 mg via INTRAVENOUS
  Filled 2019-12-12: qty 1

## 2019-12-12 MED ORDER — ACETAMINOPHEN 325 MG PO TABS: 650.0000 mg | ORAL_TABLET | Freq: Four times a day (QID) | ORAL | Status: DC | PRN

## 2019-12-12 MED ORDER — KETOROLAC TROMETHAMINE 30 MG/ML IJ SOLN
30.0000 mg | Freq: Four times a day (QID) | INTRAMUSCULAR | Status: DC
  Administered 2019-12-12 – 2019-12-13 (×4): 30 mg via INTRAVENOUS
  Filled 2019-12-12 (×4): qty 1

## 2019-12-12 MED ORDER — LORAZEPAM 2 MG/ML IJ SOLN
1.0000 mg | Freq: Once | INTRAMUSCULAR | Status: AC
  Administered 2019-12-12: 1 mg via INTRAVENOUS
  Filled 2019-12-12: qty 1

## 2019-12-12 NOTE — ED Notes (Signed)
Pt asleep upon entering room. Pt still resting. Equal rise and fall of chest noted.

## 2019-12-12 NOTE — ED Notes (Signed)
IR called to request [facility] transport to [facility] on Monday . Pt must arrive by 9am for aspiration procedure at 10 am. Must be NPO after midnight Sunday . Notified Network engineer and primary nurse. Pt is currently holding in ED and will be given in report

## 2019-12-12 NOTE — ED Provider Notes (Signed)
Patient with osteomyelitis of left sacroiliac joint.  I spoke with Dr. Graylon Good of infectious disease and she recommended starting the patient on vancomycin.  Infectious disease will follow the patient through the computer and await blood culture results   Milton Ferguson, MD 12/12/19 1021
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
## 2019-12-12 NOTE — ED Notes (Signed)
Date and time results received: 12/12/19 0405 (use smartphrase ".now" to insert current time)  Test: lactic Critical Value: 2.0  Name of Provider Notified: knapp  Orders Received? Or Actions Taken?: Orders Received - See Orders for details

## 2019-12-12 NOTE — ED Triage Notes (Signed)
Pt reports left leg pain with recent hospitalization for MRSA, pt was admitted here and received IV anbx, sent home with oral anbx- had been taking them up until the last couple of days. Pt reports pain has increased and "unable to walk".

## 2019-12-12 NOTE — ED Notes (Signed)
Pt. Given graham crackers peanut butter and ginger ale

## 2019-12-12 NOTE — Progress Notes (Signed)
Pharmacy Antibiotic Note  Jordan [name] is a 29 y.o. male admitted on 12/12/2019 with osteomyelitis of left sacroiliac joint.  Pharmacy has been consulted for vancomycin dosing.  Plan: Loading dose:  vancomycin 1g x2 doses to = vancomycin 2g total Maintenance dose: vancomycin 1.25g IV  q12h Goal vancomycin trough range:   15-20  mcg/mL Pharmacy will continue to monitor renal function, vancomycin troughs as clinically appropriate,  cultures and patient progress.   Height: 5\' 10"  (177.8 cm) Weight: 79.4 kg (175 lb) IBW/kg (Calculated) : 73  Temp (24hrs), Avg:98.8 F (37.1 C), Min:98.7 F (37.1 C), Max:98.8 F (37.1 C)  Recent Labs  Lab 12/12/19 0300 12/12/19 0452  WBC 13.0*  --   CREATININE 0.74  --   LATICACIDVEN 2.0* 1.0    Estimated Creatinine Clearance: 141.9 mL/min (by C-G formula based on SCr of 0.74 mg/dL).    No Known Allergies  Antimicrobials this admission: vancomycin 8/13 >>      Microbiology results: 8/13 BCx:2:   8/13 Resp PCR: SARS CoV-2 negative      Thank you for allowing pharmacy to be a part of this patients care.  Despina Pole 12/12/2019 11:38 AM

## 2019-12-12 NOTE — Progress Notes (Signed)
IR requested by Dr. Carles Collet for possible image-guided left SI joint aspiration.  Case/images have been reviewed by Dr. Kathlene Cote who approves procedure. Plan for image-guided left SI joint aspiration at [facility] IR (in [place]) tentatively for Monday 12/15/2019. Patient to be transferred from [facility] to [facility] and back via [place]- arrive at 0900 for 1000 procedure. Jordan Mylar, RN (charge ED RN) aware to set up [facility] for procedure. Patient will be NPO at midnight prior to procedure. Patient will be seen/consented for procedure in IR at [facility] on Monday prior to procedure. Dr. Carles Collet aware of above.  Please call IR with questions/concerns.   Jordan Graff Copper Basnett, PA-C 12/12/2019, 2:22 PM
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
## 2019-12-13 ENCOUNTER — Inpatient Hospital Stay (HOSPITAL_COMMUNITY): Payer: Self-pay

## 2019-12-13 DIAGNOSIS — M8618 Other acute osteomyelitis, other site: Principal | ICD-10-CM

## 2019-12-13 DIAGNOSIS — R7881 Bacteremia: Secondary | ICD-10-CM

## 2019-12-13 DIAGNOSIS — B9562 Methicillin resistant Staphylococcus aureus infection as the cause of diseases classified elsewhere: Secondary | ICD-10-CM

## 2019-12-13 DIAGNOSIS — M7918 Myalgia, other site: Secondary | ICD-10-CM

## 2019-12-13 DIAGNOSIS — M86159 Other acute osteomyelitis, unspecified femur: Secondary | ICD-10-CM

## 2019-12-13 DIAGNOSIS — M0088 Arthritis due to other bacteria, vertebrae: Secondary | ICD-10-CM

## 2019-12-13 LAB — BLOOD CULTURE ID PANEL (REFLEXED) - BCID2

## 2019-12-13 LAB — BASIC METABOLIC PANEL
Anion gap: 10 (ref 5–15)
BUN: 9 mg/dL (ref 6–20)
CO2: 24 mmol/L (ref 22–32)
Calcium: 8.9 mg/dL (ref 8.9–10.3)
Chloride: 107 mmol/L (ref 98–111)
Creatinine, Ser: 0.69 mg/dL (ref 0.61–1.24)
GFR calc Af Amer: >60 mL/min (ref >60)
GFR calc non Af Amer: >60 mL/min (ref >60)
Glucose, Bld: 101 mg/dL — ABNORMAL HIGH (ref 70–99)
Potassium: 3.7 mmol/L (ref 3.5–5.1)
Sodium: 141 mmol/L (ref 135–145)

## 2019-12-13 LAB — CBC
HCT: 34.3 % — ABNORMAL LOW (ref 39.0–52.0)
Hemoglobin: 10.6 g/dL — ABNORMAL LOW (ref 13.0–17.0)
MCH: 25.4 pg — ABNORMAL LOW (ref 26.0–34.0)
MCHC: 30.9 g/dL (ref 30.0–36.0)
MCV: 82.3 fL (ref 80.0–100.0)
Platelets: 404 10*3/uL — ABNORMAL HIGH (ref 150–400)
RBC: 4.17 MIL/uL — ABNORMAL LOW (ref 4.22–5.81)
RDW: 13.9 % (ref 11.5–15.5)
WBC: 11.8 10*3/uL — ABNORMAL HIGH (ref 4.0–10.5)
nRBC: 0 % (ref 0.0–0.2)

## 2019-12-13 MED ORDER — KETOROLAC TROMETHAMINE 30 MG/ML IJ SOLN
30.0000 mg | Freq: Four times a day (QID) | INTRAMUSCULAR | Status: DC
  Administered 2019-12-13: 30 mg via INTRAVENOUS
  Filled 2019-12-13: qty 1

## 2019-12-13 MED ORDER — OXYCODONE-ACETAMINOPHEN 5-325 MG PO TABS
1.0000 | ORAL_TABLET | Freq: Four times a day (QID) | ORAL | Status: DC | PRN
  Administered 2019-12-13: 1 via ORAL
  Filled 2019-12-13: qty 1

## 2019-12-13 NOTE — Progress Notes (Signed)
Patient walked to the nurses station and stated he was leaving. Patient signed  Leaving hospital against medical advice form. Left forearm IV removed.

## 2019-12-13 NOTE — Progress Notes (Signed)
CRITICAL VALUE ALERT  Critical Value:  Gram positive cocci in aerobic blood culture bottle only   Date & Time Notied:  12/13/2019 1016  Provider Notified: Dr. Shanon Brow Tat  Orders Received/Actions taken: awaiting orders/ call back
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
## 2019-12-13 NOTE — Progress Notes (Signed)
Patient states that he fell aprox. 20 minutes ago walking back from the bathroom. This fall was unwitnessed. Patient states that he hit his left hip and rated pain 10/10. Patients states he wants something else for pain. MD made aware. No new orders.

## 2019-12-13 NOTE — Procedures (Signed)
Patient does not want echo at this time

## 2019-12-13 NOTE — Progress Notes (Signed)
PROGRESS NOTE  Jordan [name] DUP:[id number] DOB: 12-Feb-1991 DOA: 12/12/2019 PCP: Patient, No Pcp Per  Brief History:  29 y.o. male with medical history of polysubstance abuse including tobacco, methamphetamine, and cocaine, MRSA bacteremia in July 2021 presenting with left gluteal/buttock pain for about 3 days duration.  He states that he is having difficulty ambulating and bearing weight on his left leg because of his left buttock pain.  He has had fevers and chills at home up to 1 to 1.5 F.  In addition, the patient states that he has continued to use IV heroin as well as IV methamphetamine.  He states that he last used IV illicit drugs approximately 4 to 5 days prior to this admission.  He denied any nausea, vomiting, diarrhea, abdominal pain, dysuria, hematuria, hematochezia, melena, hemoptysis.  He denies any neck pain or back pain.  He denies any headache, visual disturbance or focal extremity weakness. Notably, the patient was admitted to the hospital from 11/17/2019 to 11/25/2019 when he was treated for MRSA bacteremia.  Repeat blood cultures were negative at that time.  TEE was negative for vegetation.  CT of the thoracic spine was negative at that time.  MRI of the lumbar spine was also negative.  The patient was treated with daptomycin during his hospitalization, and he was sent home with Zyvox.  He states that he has not taken his Zyvox for the last 3 days because he has been having difficulty getting out of bed secondary to his left buttock pain. In the emergency department, the patient was afebrile hemodynamically stable with oxygen saturation 100% on room air.  BMP and LFTs were unremarkable.  WBC 13.0, hemoglobin 10.5, platelets 209,000.  Urine drug screen was positive for cocaine, THC, amphetamine.  Lactic acid was 2.0.  CRP was 12.7.  ESR 89.  MRI of the pelvis showed a severe bone marrow edema around the left SI joint with a 1.7 x 2.5 collection deep to the iliopsoas muscle.   There is muscle edema also in the superior aspect of the left piriformis and left gluteus maximus.  The patient was started on IV vancomycin.   Assessment/Plan: Acute osteomyelitis of pelvis -Continue IV vancomycin -IR consult to determine feasibility of aspiration of SI joint -Follow blood cultures -CRP 12.7 -ESR 89 -judicious pain control--he appears a bit histrionic -IR for aspiration of SI joint on 8/16 -change norco to percocet po  MRSA Bacteremia -repeat blood cultures -Repeat limited echo -continue vanco IV  Polysubstance abuse -Including cocaine, tobacco, amphetamine, THC -Cessation discussed  Elevated Lactate -due to infectious process -improved with IVF        Status is: Inpatient  Remains inpatient appropriate because:IV treatments appropriate due to intensity of illness or inability to take PO   Dispo: The patient is from: Home              Anticipated d/c is to: Home              Anticipated d/c date is: > 3 days              Patient currently is not medically stable to d/c.        Family Communication:   No Family at bedside  Consultants:  ID  Code Status:  FULL  DVT Prophylaxis: [address] Lovenox   Procedures: As Listed in Progress Note Above  Antibiotics: vanco IV 8/13>>>     Subjective: Patient still having mod to  severe pain in left buttock.  Requesting "stronger" pain meds.  Patient denies fevers, chills, headache, chest pain, dyspnea, nausea, vomiting, diarrhea, abdominal pain, dysuria, hematuria, hematochezia, and melena.   Objective: Vitals:   12/12/19 1900 12/12/19 2344 12/13/19 0317 12/13/19 1504  BP: 137/89 119/60 (!) 117/95 (!) 143/83  Pulse: 92 85 87 84  Resp:  _0 Temp: 99.9 F (37.7 C) 99.4 F (37.4 C) 100.3 F (37.9 C) 98.9 F (37.2 C)  TempSrc: Oral Oral Oral Oral  SpO2: 100% 100% 98% 100%  Weight:      Height:        Intake/Output Summary (Last 24 hours) at 12/13/2019 1629 Last data filed at  12/13/2019 1510 Gross per 24 hour  Intake 250 ml  Output 700 ml  Net -450 ml   Weight change:  Exam:   General:  Pt is alert, follows commands appropriately, not in acute distress  HEENT: No icterus, No thrush, No neck mass, [place]/AT  Cardiovascular: RRR, S1/S2, no rubs, no gallops  Respiratory: CTA bilaterally, no wheezing, no crackles, no rhonchi  Abdomen: Soft/+BS, non tender, non distended, no guarding  Extremities: No edema, No lymphangitis, No petechiae, No rashes, no synovitis  Neuro:  CN II-XII intact, strength 4/5 in RUE, RLE, strength 4/5 LUE, LLE; sensation intact bilateral; no dysmetria; babinski equivocal     Data Reviewed: I have personally reviewed following labs and imaging studies Basic Metabolic Panel: Recent Labs  Lab 12/12/19 0300 12/13/19 0611  NA 138 141  K 3.5 3.7  CL 107 107  CO2 24 24  GLUCOSE 107* 101*  BUN 8 9  CREATININE 0.74 0.69  CALCIUM 8.5* 8.9   Liver Function Tests: Recent Labs  Lab 12/12/19 0300  AST 18  ALT 13  ALKPHOS 104  BILITOT 0.1*  PROT 8.2*  ALBUMIN 3.1*   No results for input(s): LIPASE, AMYLASE in the last 168 hours. No results for input(s): AMMONIA in the last 168 hours. Coagulation Profile: No results for input(s): INR, PROTIME in the last 168 hours. CBC: Recent Labs  Lab 12/12/19 0300 12/13/19 0611  WBC 13.0* 11.8*  NEUTROABS 8.3*  --   HGB 10.5* 10.6*  HCT 33.2* 34.3*  MCV 81.6 82.3  PLT 390 404*   Cardiac Enzymes: No results for input(s): CKTOTAL, CKMB, CKMBINDEX, TROPONINI in the last 168 hours. BNP: Invalid input(s): POCBNP CBG: No results for input(s): GLUCAP in the last 168 hours. HbA1C: No results for input(s): HGBA1C in the last 72 hours. Urine analysis:    Component Value Date/Time   COLORURINE YELLOW 11/17/2019 2009   APPEARANCEUR CLEAR 11/17/2019 2009   LABSPEC 1.026 11/17/2019 2009   PHURINE 6.0 11/17/2019 2009   GLUCOSEU 50 (A) 11/17/2019 2009   HGBUR NEGATIVE 11/17/2019 2009    [place] NEGATIVE 11/17/2019 2009   [place] NEGATIVE 11/17/2019 2009   PROTEINUR NEGATIVE 11/17/2019 2009   NITRITE NEGATIVE 11/17/2019 2009   LEUKOCYTESUR NEGATIVE 11/17/2019 2009   Sepsis Labs: _1 (procalcitonin:4,lacticidven:4) ) Recent Results (from the past 240 hour(s))  SARS Coronavirus 2 by RT PCR (hospital order, performed in [place] hospital lab) Nasopharyngeal Nasopharyngeal Swab     Status: None   Collection Time: 12/12/19  3:00 AM   Specimen: Nasopharyngeal Swab  Result Value Ref Range Status   SARS Coronavirus 2 NEGATIVE NEGATIVE Final    Comment: (NOTE) SARS-CoV-2 target nucleic acids are NOT DETECTED.  The SARS-CoV-2 RNA is generally detectable in upper and lower respiratory specimens during the acute phase  of infection. The lowest concentration of SARS-CoV-2 viral copies this assay can detect is 250 copies / mL. A negative result does not preclude SARS-CoV-2 infection and should not be used as the sole basis for treatment or other patient management decisions.  A negative result may occur with improper specimen collection / handling, submission of specimen other than nasopharyngeal swab, presence of viral mutation(s) within the areas targeted by this assay, and inadequate number of viral copies (<250 copies / mL). A negative result must be combined with clinical observations, patient history, and epidemiological information.  Fact Sheet for Patients:   StrictlyIdeas.no  Fact Sheet for Healthcare Providers: BankingDealers.co.za  This test is not yet approved or  cleared by the [place] FDA and has been authorized for detection and/or diagnosis of SARS-CoV-2 by FDA under an Emergency Use Authorization (EUA).  This EUA will remain in effect (meaning this test can be used) for the duration of the COVID-19 declaration under Section 564(b)(1) of the Act, 21 U.S.C. section 360bbb-3(b)(1), unless  the authorization is terminated or revoked sooner.  Performed at [facility], [address], [place], [place] [address]   Culture, blood (single)     Status: None (Preliminary result)   Collection Time: 12/12/19  4:52 AM   Specimen: BLOOD  Result Value Ref Range Status   Specimen Description   Final    BLOOD RIGHT ANTECUBITAL Performed at [facility], [address] [place], [place] [address]    Special Requests   Final    BOTTLES DRAWN AEROBIC AND ANAEROBIC Blood Culture adequate volume Performed at [facility], [address], [place], [place] [address]    Culture  Setup Time   Final    GRAM POSITIVE COCCI Gram Stain Report Called to,Read Back By and Verified With: MANNS,J @ 0037 ON 12/13/19 BY JUW AEROBIC BOTTLE ONLY GS DONE @ [facility] Organism ID to follow CRITICAL RESULT CALLED TO, READ BACK BY AND VERIFIED WITH: Melina Schools RN 12/13/19 0600 JDW Performed at [facility], [place] [address] [place], [place] [address]    Culture GRAM POSITIVE COCCI  Final   Report Status PENDING  Incomplete  Blood Culture ID Panel (Reflexed)     Status: Abnormal   Collection Time: 12/12/19  4:52 AM  Result Value Ref Range Status   Enterococcus faecalis NOT DETECTED NOT DETECTED Final   Enterococcus Faecium NOT DETECTED NOT DETECTED Final   Listeria monocytogenes NOT DETECTED NOT DETECTED Final   Staphylococcus species DETECTED (A) NOT DETECTED Final    Comment: CRITICAL RESULT CALLED TO, READ BACK BY AND VERIFIED WITH: J MANNS RN 12/13/19 0600 JDW    Staphylococcus aureus (BCID) DETECTED (A) NOT DETECTED Final    Comment: Methicillin (oxacillin)-resistant Staphylococcus aureus (MRSA). MRSA is predictably resistant to beta-lactam antibiotics (except ceftaroline). Preferred therapy is vancomycin unless clinically contraindicated. Patient requires contact precautions if  hospitalized. CRITICAL RESULT CALLED TO, READ BACK BY AND VERIFIED WITH: J MANNS RN 12/13/19 0600 JDW    Staphylococcus  epidermidis NOT DETECTED NOT DETECTED Final   Staphylococcus lugdunensis NOT DETECTED NOT DETECTED Final   Streptococcus species NOT DETECTED NOT DETECTED Final   Streptococcus agalactiae NOT DETECTED NOT DETECTED Final   Streptococcus pneumoniae NOT DETECTED NOT DETECTED Final   Streptococcus pyogenes NOT DETECTED NOT DETECTED Final   A.calcoaceticus-baumannii NOT DETECTED NOT DETECTED Final   Bacteroides fragilis NOT DETECTED NOT DETECTED Final   Enterobacterales NOT DETECTED NOT DETECTED Final   Enterobacter cloacae complex NOT DETECTED NOT  DETECTED Final   Escherichia coli NOT DETECTED NOT DETECTED Final   Klebsiella aerogenes NOT DETECTED NOT DETECTED Final   Klebsiella oxytoca NOT DETECTED NOT DETECTED Final   Klebsiella pneumoniae NOT DETECTED NOT DETECTED Final   Proteus species NOT DETECTED NOT DETECTED Final   Salmonella species NOT DETECTED NOT DETECTED Final   Serratia marcescens NOT DETECTED NOT DETECTED Final   Haemophilus influenzae NOT DETECTED NOT DETECTED Final   Neisseria meningitidis NOT DETECTED NOT DETECTED Final   Pseudomonas aeruginosa NOT DETECTED NOT DETECTED Final   Stenotrophomonas maltophilia NOT DETECTED NOT DETECTED Final   Candida albicans NOT DETECTED NOT DETECTED Final   Candida auris NOT DETECTED NOT DETECTED Final   Candida glabrata NOT DETECTED NOT DETECTED Final   Candida krusei NOT DETECTED NOT DETECTED Final   Candida parapsilosis NOT DETECTED NOT DETECTED Final   Candida tropicalis NOT DETECTED NOT DETECTED Final   Cryptococcus neoformans/gattii NOT DETECTED NOT DETECTED Final   Meth resistant mecA/C and MREJ DETECTED (A) NOT DETECTED Final    Comment: CRITICAL RESULT CALLED TO, READ BACK BY AND VERIFIED WITH: J MANNS RN 12/13/19 0600 JDW Performed at [facility], [address] [place], [place] [address]   Culture, blood (single) w Reflex to ID Panel     Status: None (Preliminary result)   Collection Time: 12/12/19 10:40 AM    Specimen: BLOOD  Result Value Ref Range Status   Specimen Description   Final    BLOOD RIGHT HAND Performed at [facility], [address] [place], [place] [address]    Special Requests   Final    BOTTLES DRAWN AEROBIC AND ANAEROBIC Blood Culture adequate volume Performed at [facility], [address] [place], [place] [address]    Culture  Setup Time   Final    GRAM POSITIVE COCCI AEROBIC BOTTLE ONLY Gram Stain Report Called to,Read Back By and Verified With: BULLINS L. @ 1010 ON [id number] BY HENDERSON L CRITICAL VALUE NOTED.  VALUE IS CONSISTENT WITH PREVIOUSLY REPORTED AND CALLED VALUE. Performed at [facility], [place] [address] [place], [place] [address]    Culture GRAM POSITIVE COCCI  Final   Report Status PENDING  Incomplete     Scheduled Meds:  enoxaparin (LOVENOX) injection  40 mg Subcutaneous Q24H   ketorolac  30 mg Intravenous Q6H   Continuous Infusions:  vancomycin 1,250 mg (12/13/19 1129)    Procedures/Studies: CT THORACIC SPINE W CONTRAST  Result Date: 11/17/2019 CLINICAL DATA:  Back pain, history of IV drug use EXAM: CT THORACIC AND LUMBAR SPINE WITH CONTRAST TECHNIQUE: Multidetector CT imaging of the thoracic and lumbar spine was performed with contrast. Multiplanar CT image reconstructions were also generated. COMPARISON:  None. FINDINGS: CT THORACIC SPINE Alignment: Preserved kyphosis. Anteroposterior alignment is maintained. Vertebrae: Vertebral body heights are preserved. No acute fracture. No evidence of endplate erosion or suspicious lesion. Paraspinal and other soft tissues: Unremarkable. Disc levels: Intervertebral disc heights are preserved. There is no degenerative stenosis. CT LUMBAR SPINE Segmentation: 5 lumbar type vertebrae. Alignment: Preserved lordosis. Anteroposterior alignment is maintained. Vertebrae: Vertebral body heights are preserved. There is no acute fracture. No evidence of endplate erosion or suspicious lesion. Paraspinal and other soft  tissues: Unremarkable. Disc levels: Intervertebral disc heights are maintained. There is no degenerative stenosis. IMPRESSION: No significant abnormality. Specifically, no evidence of discitis/osteomyelitis. Electronically Signed   By: Macy Mis M.D.   On: 11/17/2019 11:55   CT LUMBAR SPINE W CONTRAST  Result Date:  11/17/2019 CLINICAL DATA:  Back pain, history of IV drug use EXAM: CT THORACIC AND LUMBAR SPINE WITH CONTRAST TECHNIQUE: Multidetector CT imaging of the thoracic and lumbar spine was performed with contrast. Multiplanar CT image reconstructions were also generated. COMPARISON:  None. FINDINGS: CT THORACIC SPINE Alignment: Preserved kyphosis. Anteroposterior alignment is maintained. Vertebrae: Vertebral body heights are preserved. No acute fracture. No evidence of endplate erosion or suspicious lesion. Paraspinal and other soft tissues: Unremarkable. Disc levels: Intervertebral disc heights are preserved. There is no degenerative stenosis. CT LUMBAR SPINE Segmentation: 5 lumbar type vertebrae. Alignment: Preserved lordosis. Anteroposterior alignment is maintained. Vertebrae: Vertebral body heights are preserved. There is no acute fracture. No evidence of endplate erosion or suspicious lesion. Paraspinal and other soft tissues: Unremarkable. Disc levels: Intervertebral disc heights are maintained. There is no degenerative stenosis. IMPRESSION: No significant abnormality. Specifically, no evidence of discitis/osteomyelitis. Electronically Signed   By: Macy Mis M.D.   On: 11/17/2019 11:55   CT PELVIS W CONTRAST  Result Date: 12/12/2019 CLINICAL DATA:  Left leg pain. Recent hospitalization for MRSA. Question osteomyelitis. EXAM: CT PELVIS WITH CONTRAST TECHNIQUE: Multidetector CT imaging of the pelvis was performed using the standard protocol following the bolus administration of intravenous contrast. CONTRAST:  141m OMNIPAQUE IOHEXOL 300 MG/ML  SOLN COMPARISON:  None. FINDINGS: Urinary  Tract: Distal ureters and urinary bladder are within normal limits. Bowel:  Unremarkable visualized pelvic bowel loops. Vascular/Lymphatic: No pathologically enlarged lymph nodes. No significant vascular abnormality seen. Reproductive:  Prostate is unremarkable. Musculoskeletal: Osseous pelvis is unremarkable. The hips are located and within normal limits. Proximal femurs are normal bilaterally. No discrete soft tissue abscess or focal osteolysis is present. There is some motion artifact. IMPRESSION: 1. No osseous abnormality in the pelvis or proximal femurs. 2. Normal CT of the pelvis. Electronically Signed   By: CSan MorelleM.D.   On: 12/12/2019 04:52   MR Lumbar Spine W Wo Contrast  Result Date: 11/20/2019 CLINICAL DATA:  Left buttock and lower extremity pain. EXAM: MRI LUMBAR SPINE WITHOUT AND WITH CONTRAST TECHNIQUE: Multiplanar and multiecho pulse sequences of the lumbar spine were obtained without and with intravenous contrast. CONTRAST:  741mGADAVIST GADOBUTROL 1 MMOL/ML IV SOLN COMPARISON:  None. FINDINGS: Segmentation:  Standard. Alignment:  Physiologic. Vertebrae:  No fracture, evidence of discitis, or bone lesion. Conus medullaris and cauda equina: Conus extends to the L1 level. Conus and cauda equina appear normal. Paraspinal and other soft tissues: Negative Disc levels: No disc herniation spinal canal stenosis or neural impingement. Normal facets. No abnormal contrast enhancement. IMPRESSION: Normal MRI of the lumbar spine. Electronically Signed   By: KeUlyses Jarred.D.   On: 11/20/2019 20:42   MR PELVIS WO CONTRAST  Result Date: 12/12/2019 CLINICAL DATA:  Elevated ESR. MRSA bacteremia in July. Left posterior pelvic pain. Left buttock pain. EXAM: MRI PELVIS WITHOUT CONTRAST TECHNIQUE: Multiplanar multisequence MR imaging of the pelvis was performed. No intravenous contrast was administered. COMPARISON:  None. FINDINGS: Bones: No hip fracture, dislocation or avascular necrosis. No  periosteal reaction or bone destruction. No aggressive osseous lesion. 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 Severe bone marrow edema centered around the left sacroiliac joint with adjacent soft tissue edema with a left SI joint effusion which is decompressing anteriorly where there is a focal 1.7 x 2.5 cm component deep to the iliopsoas muscle. Articular cartilage and labrum Articular cartilage:  No chondral defect. Labrum: Grossly intact, but evaluation is limited by lack of intraarticular fluid. Joint or bursal  effusion Joint effusion:  No hip joint effusion.  No SI joint effusion. Bursae:  No bursa formation. Muscles and tendons Flexors: Normal. Extensors: Normal. Abductors: Normal. Adductors: Normal. Rotators: Muscle edema in the superior aspect of the left piriformis muscle adjacent to the inferior aspect of the left sacroiliac joint/which may be reactive versus secondary to infectious myositis. There is mild muscle edema in the left gluteus maximus muscle also contiguous with the posterior aspect of the left sacroiliac joint which may be again reactive or secondary to infectious myositis. No intramuscular fluid collection. Hamstrings: Normal. Other findings No pelvic free fluid. No fluid collection or hematoma. No inguinal lymphadenopathy. No inguinal hernia. IMPRESSION: 1. Severe bone marrow edema centered around the left sacroiliac joint with a left SI joint effusion which is decompressing anteriorly where there is a focal 1.7 x 2.5 cm component deep to the iliopsoas muscle. Findings are most consistent with osteomyelitis of the left sacroiliac joint. 2. Muscle edema in the superior aspect of the left piriformis muscle adjacent to the inferior aspect of the left sacroiliac and to lesser extent mild muscle edema in the left gluteus maximus muscle which may be reactive or secondary to infectious myositis. No intramuscular fluid collection. Electronically Signed   By: Kathreen Devoid   On: 12/12/2019 09:00     DG CHEST PORT 1 VIEW  Result Date: 11/19/2019 CLINICAL DATA:  29 year old male with chest pain EXAM: PORTABLE CHEST 1 VIEW COMPARISON:  Chest radiograph dated 11/17/2019 and CT dated 09/10/2019. FINDINGS: No focal consolidation, pleural effusion, or pneumothorax. The cardiac silhouette is within limits. No acute osseous pathology. IMPRESSION: No active disease. Electronically Signed   By: Anner Crete M.D.   On: 11/19/2019 22:44   DG Chest Port 1 View  Result Date: 11/17/2019 CLINICAL DATA:  Fever. EXAM: PORTABLE CHEST 1 VIEW COMPARISON:  09/19/2019 FINDINGS: The cardiomediastinal silhouette is within normal limits. Mildly coarsened interstitial markings are similar to the prior study. No confluent airspace opacity, edema, pleural effusion, pneumothorax is identified. No acute osseous abnormality is seen. IMPRESSION: No active disease. Electronically Signed   By: Logan Bores M.D.   On: 11/17/2019 04:49   ECHOCARDIOGRAM COMPLETE  Result Date: 11/18/2019    ECHOCARDIOGRAM REPORT   Patient Name:   NEEV MCMAINS Date of Exam: 11/18/2019 Medical Rec #:  [id number]      Height:       70.0 in Accession #:    [id number]     Weight:       177.5 lb Date of Birth:  1991/03/06       BSA:          1.984 m Patient Age:    29 years       BP:           156/75 mmHg Patient Gender: M              HR:           100 bpm. Exam Location:  [facility] Procedure: 2D Echo, Cardiac Doppler and Color Doppler Indications:    Bacteremia 790.7 / R78.81  History:        Patient has no prior history of Echocardiogram examinations.                 Risk Factors:Current Smoker. IV drug abuse, Hypokalemia, Sepsis.  Sonographer:    Alvino Chapel RCS Referring Phys: [phone] [place]  1. Left ventricular ejection fraction, by estimation, is 60 to 65%.  The left ventricle has normal function. The left ventricle has no regional wall motion abnormalities. There is mild left ventricular hypertrophy. Left ventricular  diastolic parameters were normal.  2. Right ventricular systolic function is normal. The right ventricular size is normal.  3. Left atrial size was mildly dilated.  4. The mitral valve is normal in structure. No evidence of mitral valve regurgitation. No evidence of mitral stenosis.  5. The aortic valve is tricuspid. Aortic valve regurgitation is not visualized. No aortic stenosis is present.  6. The inferior vena cava is normal in size with greater than 50% respiratory variability, suggesting right atrial pressure of 3 mmHg. FINDINGS  Left Ventricle: Left ventricular ejection fraction, by estimation, is 60 to 65%. The left ventricle has normal function. The left ventricle has no regional wall motion abnormalities. The left ventricular internal cavity size was normal in size. There is  mild left ventricular hypertrophy. Left ventricular diastolic parameters were normal. Right Ventricle: The right ventricular size is normal. No increase in right ventricular wall thickness. Right ventricular systolic function is normal. Left Atrium: Left atrial size was mildly dilated. Right Atrium: Right atrial size was normal in size. Pericardium: There is no evidence of pericardial effusion. Mitral Valve: The mitral valve is normal in structure. No evidence of mitral valve regurgitation. No evidence of mitral valve stenosis. Tricuspid Valve: The tricuspid valve is normal in structure. Tricuspid valve regurgitation is not demonstrated. No evidence of tricuspid stenosis. Aortic Valve: The aortic valve is tricuspid. Aortic valve regurgitation is not visualized. No aortic stenosis is present. Aortic valve mean gradient measures 5.2 mmHg. Aortic valve peak gradient measures 10.1 mmHg. Aortic valve area, by VTI measures 3.46  cm. Pulmonic Valve: The pulmonic valve was not well visualized. Pulmonic valve regurgitation is not visualized. No evidence of pulmonic stenosis. Aorta: The aortic root is normal in size and structure. Pulmonary  Artery: Indeterminant PASP, inadequate TR jet. Venous: The inferior vena cava is normal in size with greater than 50% respiratory variability, suggesting right atrial pressure of 3 mmHg. IAS/Shunts: No atrial level shunt detected by color flow Doppler.  LEFT VENTRICLE PLAX 2D LVIDd:         5.39 cm  Diastology LVIDs:         3.74 cm  LV e' lateral:   20.70 cm/s LV PW:         1.07 cm  LV E/e' lateral: 6.1 LV IVS:        1.00 cm  LV e' medial:    13.30 cm/s LVOT diam:     2.30 cm  LV E/e' medial:  9.5 LV SV:         100 LV SV Index:   50 LVOT Area:     4.15 cm  RIGHT VENTRICLE RV S prime:     21.00 cm/s TAPSE (M-mode): 2.5 cm LEFT ATRIUM             Index       RIGHT ATRIUM           Index LA diam:        3.50 cm 1.76 cm/m  RA Area:     18.70 cm LA Vol (A2C):   71.6 ml 36.09 ml/m RA Volume:   54.20 ml  27.32 ml/m LA Vol (A4C):   81.1 ml 40.88 ml/m LA Biplane Vol: 77.5 ml 39.06 ml/m  AORTIC VALVE AV Area (Vmax):    3.42 cm AV Area (Vmean):   3.13 cm AV Area (  VTI):     3.46 cm AV Vmax:           159.28 cm/s AV Vmean:          104.051 cm/s AV VTI:            0.289 m AV Peak Grad:      10.1 mmHg AV Mean Grad:      5.2 mmHg LVOT Vmax:         131.00 cm/s LVOT Vmean:        78.400 cm/s LVOT VTI:          0.241 m LVOT/AV VTI ratio: 0.83  AORTA Ao Root diam: 3.00 cm MITRAL VALVE MV Area (PHT): 5.06 cm     SHUNTS MV Decel Time: 150 msec     Systemic VTI:  0.24 m MV E velocity: 127.00 cm/s  Systemic Diam: 2.30 cm MV A velocity: 56.30 cm/s MV E/A ratio:  2.26 Carlyle Dolly MD Electronically signed by Carlyle Dolly MD Signature Date/Time: 11/18/2019/4:49:21 PM    Final    ECHO TEE  Result Date: 11/24/2019    TRANSESOPHOGEAL ECHO REPORT   Patient Name:   VICTORIA EUCEDA Date of Exam: 11/24/2019 Medical Rec #:  [id number]      Height:       70.0 in Accession #:    [id number]     Weight:       177.5 lb Date of Birth:  [birth year]       BSA:          1.984 m Patient Age:    29 years       BP:           136/83 mmHg  Patient Gender: M              HR:           96 bpm. Exam Location:  [facility] Procedure: Transesophageal Echo, Cardiac Doppler and Color Doppler Indications:    Bacteremia  History:        Patient has prior history of Echocardiogram examinations, most                 recent 11/18/2019. Risk Factors:Current Smoker. IV drug abuse,                 Hypokalemia, Sepsis.  Sonographer:    Alvino Chapel RCS Referring Phys: [id number] [place]: TEE procedure time was 8 minutes. The transesophogeal probe was passed without difficulty through the esophogus of the patient. Imaged were obtained with the patient in a left lateral decubitus position. Sedation performed by different physician. The patient was monitored while under deep sedation. Image quality was excellent. The patient developed no complications during the procedure. IMPRESSIONS  1. Left ventricular ejection fraction, by estimation, is 55 to 60%. The left ventricle has normal function. The left ventricle has no regional wall motion abnormalities.  2. Right ventricular systolic function is normal. The right ventricular size is normal.  3. No left atrial/left atrial appendage thrombus was detected. The LAA emptying velocity was 80 cm/s.  4. The mitral valve is grossly normal. Trivial mitral valve regurgitation.  5. The aortic valve is tricuspid. Aortic valve regurgitation is not visualized.  6. Normal descending aorta.  7. No valvular vegetations. FINDINGS  Left Ventricle: Left ventricular ejection fraction, by estimation, is 55 to 60%. The left ventricle has normal function. The left ventricle has no regional wall motion abnormalities. The left ventricular internal cavity size  was normal in size. There is  no left ventricular hypertrophy. Right Ventricle: The right ventricular size is normal. No increase in right ventricular wall thickness. Right ventricular systolic function is normal. Left Atrium: Left atrial size was normal in size. No left  atrial/left atrial appendage thrombus was detected. The LAA emptying velocity was 80 cm/s. Right Atrium: Right atrial size was normal in size. Pericardium: There is no evidence of pericardial effusion. Mitral Valve: The mitral valve is grossly normal. Trivial mitral valve regurgitation. Tricuspid Valve: The tricuspid valve is grossly normal. Tricuspid valve regurgitation is trivial. Aortic Valve: The aortic valve is tricuspid. Aortic valve regurgitation is not visualized. Pulmonic Valve: The pulmonic valve was grossly normal. Pulmonic valve regurgitation is trivial. Aorta: Normal descending aorta. The aortic root is normal in size and structure. IAS/Shunts: No atrial level shunt detected by color flow Doppler. Rozann Lesches MD Electronically signed by Rozann Lesches MD Signature Date/Time: 11/24/2019/4:14:09 PM    Final     Orson Eva, DO  [facility]  If 7PM-7AM, please contact night-coverage [email or web address] Password [email or web address] 12/13/2019, 4:29 PM   LOS: 1 day

## 2019-12-13 NOTE — Plan of Care (Signed)

## 2019-12-15 ENCOUNTER — Inpatient Hospital Stay (HOSPITAL_COMMUNITY)
Admission: EM | Admit: 2019-12-15 | Discharge: 2019-12-26 | DRG: 917 | Discharge status: Against Medical Advice | Payer: Self-pay | Attending: Internal Medicine | Admitting: Internal Medicine

## 2019-12-15 ENCOUNTER — Encounter (HOSPITAL_COMMUNITY): Payer: Self-pay

## 2019-12-15 DIAGNOSIS — F1410 Cocaine abuse, uncomplicated: Secondary | ICD-10-CM | POA: Diagnosis present

## 2019-12-15 DIAGNOSIS — R7989 Other specified abnormal findings of blood chemistry: Secondary | ICD-10-CM | POA: Diagnosis present

## 2019-12-15 DIAGNOSIS — F329 Major depressive disorder, single episode, unspecified: Secondary | ICD-10-CM | POA: Diagnosis present

## 2019-12-15 DIAGNOSIS — D75839 Thrombocytosis, unspecified: Secondary | ICD-10-CM | POA: Diagnosis present

## 2019-12-15 DIAGNOSIS — T40411A Poisoning by fentanyl or fentanyl analogs, accidental (unintentional), initial encounter: Principal | ICD-10-CM | POA: Diagnosis present

## 2019-12-15 DIAGNOSIS — D72829 Elevated white blood cell count, unspecified: Secondary | ICD-10-CM | POA: Diagnosis present

## 2019-12-15 DIAGNOSIS — Z8614 Personal history of Methicillin resistant Staphylococcus aureus infection: Secondary | ICD-10-CM

## 2019-12-15 DIAGNOSIS — Z79891 Long term (current) use of opiate analgesic: Secondary | ICD-10-CM

## 2019-12-15 DIAGNOSIS — T50901A Poisoning by unspecified drugs, medicaments and biological substances, accidental (unintentional), initial encounter: Secondary | ICD-10-CM

## 2019-12-15 DIAGNOSIS — D473 Essential (hemorrhagic) thrombocythemia: Secondary | ICD-10-CM

## 2019-12-15 DIAGNOSIS — R7881 Bacteremia: Secondary | ICD-10-CM | POA: Diagnosis present

## 2019-12-15 DIAGNOSIS — D649 Anemia, unspecified: Secondary | ICD-10-CM | POA: Diagnosis present

## 2019-12-15 DIAGNOSIS — M86159 Other acute osteomyelitis, unspecified femur: Secondary | ICD-10-CM | POA: Diagnosis present

## 2019-12-15 DIAGNOSIS — M25552 Pain in left hip: Secondary | ICD-10-CM | POA: Diagnosis present

## 2019-12-15 DIAGNOSIS — F17200 Nicotine dependence, unspecified, uncomplicated: Secondary | ICD-10-CM | POA: Diagnosis present

## 2019-12-15 DIAGNOSIS — Z20822 Contact with and (suspected) exposure to covid-19: Secondary | ICD-10-CM | POA: Diagnosis present

## 2019-12-15 DIAGNOSIS — M62830 Muscle spasm of back: Secondary | ICD-10-CM | POA: Diagnosis present

## 2019-12-15 DIAGNOSIS — L0291 Cutaneous abscess, unspecified: Secondary | ICD-10-CM

## 2019-12-15 DIAGNOSIS — B9562 Methicillin resistant Staphylococcus aureus infection as the cause of diseases classified elsewhere: Secondary | ICD-10-CM | POA: Diagnosis present

## 2019-12-15 DIAGNOSIS — M2548 Effusion, other site: Secondary | ICD-10-CM | POA: Diagnosis present

## 2019-12-15 DIAGNOSIS — M86152 Other acute osteomyelitis, left femur: Secondary | ICD-10-CM

## 2019-12-15 DIAGNOSIS — M8610 Other acute osteomyelitis, unspecified site: Secondary | ICD-10-CM | POA: Diagnosis present

## 2019-12-15 DIAGNOSIS — Z79899 Other long term (current) drug therapy: Secondary | ICD-10-CM

## 2019-12-15 DIAGNOSIS — M009 Pyogenic arthritis, unspecified: Secondary | ICD-10-CM | POA: Diagnosis present

## 2019-12-15 DIAGNOSIS — M869 Osteomyelitis, unspecified: Secondary | ICD-10-CM

## 2019-12-15 DIAGNOSIS — F1123 Opioid dependence with withdrawal: Secondary | ICD-10-CM | POA: Diagnosis present

## 2019-12-15 DIAGNOSIS — M545 Low back pain: Secondary | ICD-10-CM | POA: Diagnosis present

## 2019-12-15 DIAGNOSIS — M60009 Infective myositis, unspecified site: Secondary | ICD-10-CM | POA: Diagnosis present

## 2019-12-15 DIAGNOSIS — K6812 Psoas muscle abscess: Secondary | ICD-10-CM | POA: Diagnosis present

## 2019-12-15 DIAGNOSIS — F1910 Other psychoactive substance abuse, uncomplicated: Secondary | ICD-10-CM | POA: Diagnosis present

## 2019-12-15 HISTORY — DX: Methicillin resistant Staphylococcus aureus infection, unspecified site: A49.02

## 2019-12-15 HISTORY — DX: Other psychoactive substance abuse, uncomplicated: F19.10

## 2019-12-15 LAB — COMPREHENSIVE METABOLIC PANEL
ALT: 21 U/L (ref 0–44)
AST: 26 U/L (ref 15–41)
Albumin: 3.1 g/dL — ABNORMAL LOW (ref 3.5–5.0)
Alkaline Phosphatase: 100 U/L (ref 38–126)
Anion gap: 11 (ref 5–15)
BUN: 13 mg/dL (ref 6–20)
CO2: 24 mmol/L (ref 22–32)
Calcium: 9.2 mg/dL (ref 8.9–10.3)
Chloride: 105 mmol/L (ref 98–111)
Creatinine, Ser: 0.84 mg/dL (ref 0.61–1.24)
GFR calc Af Amer: >60 mL/min (ref >60)
GFR calc non Af Amer: >60 mL/min (ref >60)
Glucose, Bld: 103 mg/dL — ABNORMAL HIGH (ref 70–99)
Potassium: 3.6 mmol/L (ref 3.5–5.1)
Sodium: 140 mmol/L (ref 135–145)
Total Bilirubin: 0.8 mg/dL (ref 0.3–1.2)
Total Protein: 7.6 g/dL (ref 6.5–8.1)

## 2019-12-15 LAB — CULTURE, BLOOD (SINGLE)
Special Requests: ADEQUATE
Special Requests: ADEQUATE

## 2019-12-15 LAB — RAPID URINE DRUG SCREEN, HOSP PERFORMED
Amphetamines: NOT DETECTED
Barbiturates: NOT DETECTED
Benzodiazepines: NOT DETECTED
Cocaine: NOT DETECTED
Opiates: POSITIVE — AB
Tetrahydrocannabinol: POSITIVE — AB

## 2019-12-15 LAB — CBC
HCT: 33.7 % — ABNORMAL LOW (ref 39.0–52.0)
Hemoglobin: 10.1 g/dL — ABNORMAL LOW (ref 13.0–17.0)
MCH: 25.1 pg — ABNORMAL LOW (ref 26.0–34.0)
MCHC: 30 g/dL (ref 30.0–36.0)
MCV: 83.6 fL (ref 80.0–100.0)
Platelets: 468 10*3/uL — ABNORMAL HIGH (ref 150–400)
RBC: 4.03 MIL/uL — ABNORMAL LOW (ref 4.22–5.81)
RDW: 14.1 % (ref 11.5–15.5)
WBC: 12.4 10*3/uL — ABNORMAL HIGH (ref 4.0–10.5)
nRBC: 0 % (ref 0.0–0.2)

## 2019-12-15 LAB — ETHANOL: Alcohol, Ethyl (B): <10 mg/dL (ref <10)

## 2019-12-15 LAB — SARS CORONAVIRUS 2 BY RT PCR (HOSPITAL ORDER, PERFORMED IN ~~LOC~~ HOSPITAL LAB): SARS Coronavirus 2: NEGATIVE

## 2019-12-15 MED ORDER — VANCOMYCIN HCL IN DEXTROSE 1-5 GM/200ML-% IV SOLN
1000.0000 mg | Freq: Three times a day (TID) | INTRAVENOUS | Status: DC
  Administered 2019-12-16 – 2019-12-18 (×7): 1000 mg via INTRAVENOUS
  Filled 2019-12-15 (×10): qty 200

## 2019-12-15 MED ORDER — VANCOMYCIN HCL 1500 MG/300ML IV SOLN
1500.0000 mg | Freq: Once | INTRAVENOUS | Status: DC
  Administered 2019-12-15: 1500 mg via INTRAVENOUS
  Filled 2019-12-15: qty 300

## 2019-12-15 MED ORDER — ACETAMINOPHEN 325 MG PO TABS
650.0000 mg | ORAL_TABLET | Freq: Four times a day (QID) | ORAL | Status: DC | PRN
  Administered 2019-12-25: 650 mg via ORAL
  Filled 2019-12-15: qty 2

## 2019-12-15 MED ORDER — ACETAMINOPHEN 650 MG RE SUPP: 650.0000 mg | Freq: Four times a day (QID) | RECTAL | Status: DC | PRN

## 2019-12-15 MED ORDER — ONDANSETRON HCL 4 MG/2ML IJ SOLN: 4.0000 mg | Freq: Four times a day (QID) | INTRAMUSCULAR | Status: DC | PRN

## 2019-12-15 MED ORDER — ACETAMINOPHEN 325 MG PO TABS
650.0000 mg | ORAL_TABLET | Freq: Once | ORAL | Status: AC
  Administered 2019-12-15: 650 mg via ORAL
  Filled 2019-12-15: qty 2

## 2019-12-15 MED ORDER — ENOXAPARIN SODIUM 40 MG/0.4ML ~~LOC~~ SOLN
40.0000 mg | SUBCUTANEOUS | Status: DC
  Administered 2019-12-16 – 2019-12-25 (×7): 40 mg via SUBCUTANEOUS
  Filled 2019-12-15 (×11): qty 0.4

## 2019-12-15 MED ORDER — NAPROXEN 250 MG PO TABS
500.0000 mg | ORAL_TABLET | Freq: Two times a day (BID) | ORAL | Status: AC | PRN
  Administered 2019-12-15: 500 mg via ORAL
  Filled 2019-12-15 (×2): qty 2

## 2019-12-15 MED ORDER — CLONIDINE HCL 0.1 MG PO TABS
0.1000 mg | ORAL_TABLET | Freq: Four times a day (QID) | ORAL | Status: AC
  Administered 2019-12-15 – 2019-12-17 (×10): 0.1 mg via ORAL
  Filled 2019-12-15 (×10): qty 1

## 2019-12-15 MED ORDER — VANCOMYCIN HCL 1500 MG/300ML IV SOLN: 1500.0000 mg | Freq: Once | INTRAVENOUS | Status: DC

## 2019-12-15 MED ORDER — NICOTINE 21 MG/24HR TD PT24
21.0000 mg | MEDICATED_PATCH | Freq: Every day | TRANSDERMAL | Status: DC
  Administered 2019-12-15 – 2019-12-25 (×10): 21 mg via TRANSDERMAL
  Filled 2019-12-15 (×12): qty 1

## 2019-12-15 MED ORDER — CLONIDINE HCL 0.1 MG PO TABS
0.1000 mg | ORAL_TABLET | Freq: Every day | ORAL | Status: AC
  Administered 2019-12-20 – 2019-12-21 (×2): 0.1 mg via ORAL
  Filled 2019-12-15 (×2): qty 1

## 2019-12-15 MED ORDER — VANCOMYCIN HCL IN DEXTROSE 1-5 GM/200ML-% IV SOLN: 1000.0000 mg | Freq: Three times a day (TID) | INTRAVENOUS | Status: DC

## 2019-12-15 MED ORDER — ONDANSETRON 4 MG PO TBDP: 4.0000 mg | ORAL_TABLET | Freq: Four times a day (QID) | ORAL | Status: DC | PRN

## 2019-12-15 MED ORDER — METHOCARBAMOL 500 MG PO TABS
500.0000 mg | ORAL_TABLET | Freq: Three times a day (TID) | ORAL | Status: AC | PRN
  Administered 2019-12-15 – 2019-12-19 (×4): 500 mg via ORAL
  Filled 2019-12-15 (×4): qty 1

## 2019-12-15 MED ORDER — ONDANSETRON HCL 4 MG PO TABS: 4.0000 mg | ORAL_TABLET | Freq: Four times a day (QID) | ORAL | Status: DC | PRN

## 2019-12-15 MED ORDER — DICYCLOMINE HCL 20 MG PO TABS: 20.0000 mg | ORAL_TABLET | Freq: Four times a day (QID) | ORAL | Status: AC | PRN

## 2019-12-15 MED ORDER — BUSPIRONE HCL 5 MG PO TABS
5.0000 mg | ORAL_TABLET | Freq: Two times a day (BID) | ORAL | Status: DC
  Administered 2019-12-15 – 2019-12-25 (×20): 5 mg via ORAL
  Filled 2019-12-15 (×21): qty 1

## 2019-12-15 MED ORDER — KETOROLAC TROMETHAMINE 30 MG/ML IJ SOLN
30.0000 mg | Freq: Four times a day (QID) | INTRAMUSCULAR | Status: AC | PRN
  Administered 2019-12-15 – 2019-12-20 (×11): 30 mg via INTRAVENOUS
  Filled 2019-12-15 (×11): qty 1

## 2019-12-15 MED ORDER — LOPERAMIDE HCL 2 MG PO CAPS: 2.0000 mg | ORAL_CAPSULE | ORAL | Status: AC | PRN

## 2019-12-15 MED ORDER — HYDROXYZINE HCL 25 MG PO TABS
25.0000 mg | ORAL_TABLET | Freq: Four times a day (QID) | ORAL | Status: AC | PRN
  Administered 2019-12-15 – 2019-12-19 (×6): 25 mg via ORAL
  Filled 2019-12-15 (×6): qty 1

## 2019-12-15 MED ORDER — SODIUM CHLORIDE 0.9% FLUSH
3.0000 mL | Freq: Two times a day (BID) | INTRAVENOUS | Status: DC
  Administered 2019-12-15 – 2019-12-19 (×8): 3 mL via INTRAVENOUS

## 2019-12-15 MED ORDER — ALBUTEROL SULFATE (2.5 MG/3ML) 0.083% IN NEBU: 2.5000 mg | INHALATION_SOLUTION | Freq: Four times a day (QID) | RESPIRATORY_TRACT | Status: DC | PRN

## 2019-12-15 MED ORDER — CLONIDINE HCL 0.1 MG PO TABS
0.1000 mg | ORAL_TABLET | ORAL | Status: AC
  Administered 2019-12-18 – 2019-12-19 (×4): 0.1 mg via ORAL
  Filled 2019-12-15 (×4): qty 1

## 2019-12-15 NOTE — Progress Notes (Signed)
Pharmacy Antibiotic Note  Jordan [name] is a 29 y.o. male admitted on 12/15/2019 with osteomyelitis.  Pharmacy has been consulted for vancomycin dosing. Pt is afebrile but WBC is elevated. Scr is WNL. Pt was recently on vancomycin but left AMA.   Plan: Vancomycin 1500mg  IV x 1 then 1gm IV Q8H F/u renal fxn, C&S, clinical status sand trough at SS  Height: 5\' 10"  (177.8 cm) Weight: 79.4 kg (175 lb) IBW/kg (Calculated) : 73  Temp (24hrs), Avg:97.8 F (36.6 C), Min:97.8 F (36.6 C), Max:97.8 F (36.6 C)  Recent Labs  Lab 12/12/19 0300 12/12/19 0452 12/13/19 0611 12/15/19 1413  WBC 13.0*  --  11.8* 12.4*  CREATININE 0.74  --  0.69 0.84  LATICACIDVEN 2.0* 1.0  --   --     Estimated Creatinine Clearance: 135.2 mL/min (by C-G formula based on SCr of 0.84 mg/dL).    No Known Allergies  Antimicrobials this admission: Vanc 8/16>>  Dose adjustments this admission: N/A  Microbiology results: Pending  Thank you for allowing pharmacy to be a part of this patient's care.  Reeta Kuk, Rande Lawman 12/15/2019 4:08 PM
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
## 2019-12-15 NOTE — ED Triage Notes (Signed)
Pt bib [facility] for drug overdose. Pt was at a [facility] in [place] when he fell to ground and was unresponsive. Pt was given 4mg  IN Narcan by the pharmacist at [facility]. Pt now a.o, ambulatory. Pt recently admitted to [facility] for MRSA infection.
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
## 2019-12-16 ENCOUNTER — Inpatient Hospital Stay (HOSPITAL_COMMUNITY): Payer: Self-pay

## 2019-12-16 ENCOUNTER — Other Ambulatory Visit: Payer: Self-pay

## 2019-12-16 ENCOUNTER — Encounter (HOSPITAL_COMMUNITY): Payer: Self-pay | Admitting: Internal Medicine

## 2019-12-16 DIAGNOSIS — R7881 Bacteremia: Secondary | ICD-10-CM

## 2019-12-16 HISTORY — PX: OTHER SURGICAL HISTORY: SHX169

## 2019-12-16 LAB — BASIC METABOLIC PANEL
Anion gap: 7 (ref 5–15)
BUN: 15 mg/dL (ref 6–20)
CO2: 25 mmol/L (ref 22–32)
Calcium: 8.8 mg/dL — ABNORMAL LOW (ref 8.9–10.3)
Chloride: 105 mmol/L (ref 98–111)
Creatinine, Ser: 0.85 mg/dL (ref 0.61–1.24)
GFR calc Af Amer: >60 mL/min (ref >60)
GFR calc non Af Amer: >60 mL/min (ref >60)
Glucose, Bld: 91 mg/dL (ref 70–99)
Potassium: 4.1 mmol/L (ref 3.5–5.1)
Sodium: 137 mmol/L (ref 135–145)

## 2019-12-16 LAB — CBC
HCT: 31.8 % — ABNORMAL LOW (ref 39.0–52.0)
Hemoglobin: 9.6 g/dL — ABNORMAL LOW (ref 13.0–17.0)
MCH: 25.2 pg — ABNORMAL LOW (ref 26.0–34.0)
MCHC: 30.2 g/dL (ref 30.0–36.0)
MCV: 83.5 fL (ref 80.0–100.0)
Platelets: 406 10*3/uL — ABNORMAL HIGH (ref 150–400)
RBC: 3.81 MIL/uL — ABNORMAL LOW (ref 4.22–5.81)
RDW: 14.4 % (ref 11.5–15.5)
WBC: 11.9 10*3/uL — ABNORMAL HIGH (ref 4.0–10.5)
nRBC: 0 % (ref 0.0–0.2)

## 2019-12-16 LAB — ECHOCARDIOGRAM COMPLETE
Area-P 1/2: 3.37 cm2
Height: 70 in
S' Lateral: 3.3 cm
Weight: 2800 oz

## 2019-12-16 MED ORDER — FENTANYL CITRATE (PF) 100 MCG/2ML IJ SOLN
INTRAMUSCULAR | Status: AC
  Filled 2019-12-16: qty 2

## 2019-12-16 MED ORDER — MIDAZOLAM HCL 2 MG/2ML IJ SOLN
INTRAMUSCULAR | Status: AC
  Filled 2019-12-16: qty 2

## 2019-12-16 MED ORDER — LIDOCAINE HCL 1 % IJ SOLN
INTRAMUSCULAR | Status: AC
  Administered 2019-12-16: 20 mL
  Filled 2019-12-16: qty 20

## 2019-12-16 MED ORDER — OXYCODONE HCL 5 MG PO TABS
10.0000 mg | ORAL_TABLET | ORAL | Status: DC | PRN
  Administered 2019-12-16 – 2019-12-23 (×34): 10 mg via ORAL
  Filled 2019-12-16 (×34): qty 2

## 2019-12-16 MED ORDER — MIDAZOLAM HCL 2 MG/2ML IJ SOLN
INTRAMUSCULAR | Status: AC | PRN
  Administered 2019-12-16: 1 mg via INTRAVENOUS

## 2019-12-16 MED ORDER — FENTANYL CITRATE (PF) 100 MCG/2ML IJ SOLN
INTRAMUSCULAR | Status: AC | PRN
  Administered 2019-12-16: 50 ug via INTRAVENOUS
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
## 2019-12-16 NOTE — ED Notes (Signed)
Pt refused to be placed in [facility] at this time.

## 2019-12-16 NOTE — Progress Notes (Signed)
CSW received consult for patient for substance abuse counseling - CSW attempted to meet with patient at bedside for a discussion but he was in IR for a procedure.  Madilyn Fireman, MSW, LCSW-A Transitions of Care  Clinical Social Worker  [facility] Emergency Departments  Medical ICU [phone]

## 2019-12-16 NOTE — ED Notes (Signed)
Pt able to ambulate independently to restroom, gait changed but stable due to pain

## 2019-12-16 NOTE — ED Notes (Signed)
Echo at bedside

## 2019-12-16 NOTE — Procedures (Signed)
Interventional Radiology Procedure Note  Procedure: Image guided aspiration of left iliacus muscle fluid collection, overlying the left SI joint.  ~5cc of murky bloody fluid aspirated for cx.  Complications: None  EBL: None Sample: Culture sent  Recommendations: - follow up Cx - routine wound care  Signed,  Dulcy Fanny. Earleen [place], DO

## 2019-12-16 NOTE — Progress Notes (Signed)
  Echocardiogram 2D Echocardiogram has been performed.  Jordan [name] [name] 12/16/2019, 2:32 PM

## 2019-12-16 NOTE — Progress Notes (Signed)
PROGRESS NOTE    Jordan [name]  ERD:[id number] DOB: 01/30/1991 DOA: 12/15/2019 PCP: Patient, No Pcp Per    Brief Narrative:  29 year old male with IV drug use, hospitalized on 7/19-7/27 for MRSA bacteremia, TEE negative, treated with vancomycin and then discharged on Zyvox.  Readmitted with fever and worsening buttock pain on 8/13 growing MRSA bacteremia and left AMA.  MRI at that time was consistent with osteomyelitis of the left SI joint with underlying collection.  He was found passed out in front of [facility] on the ground and unresponsive.  Was using fentanyl prior to that.  He was given Narcan and brought to the ER. At the emergency room, afebrile.  Hemodynamically stable.    Assessment & Plan:   Principal Problem:   Drug overdose Active Problems:   Polysubstance abuse (HCC)   Bacteremia   Acute osteomyelitis, pelvis (HCC)   MRSA bacteremia   Leukocytosis   Thrombocytosis (HCC)  MRSA bacteremia due to IV drug use/recurrent MRSA infection/septic arthritis: Currently on vancomycin.  Continue.  Repeat blood cultures negative so far. IR guided aspiration of hip abscess done, cultures pending. 2D echocardiogram today. Repeat cultures today. Followed by infectious disease.  IV drug use with high risk of opiate withdrawals: Uses high amount of fentanyl, cocaine, methamphetamine, benzodiazepine. Patient was started on opiate withdrawal protocol with different medications including clonidine, baclofen, NSAIDs.  Continues to complain of pain.  Will use short course of oral opiates with gradual taper given severe pain due to osteomyelitis and septic arthritis. Extensive counseling done.  He does want help and wants to quit.    DVT prophylaxis: enoxaparin (LOVENOX) injection 40 mg Start: 12/15/19 2200   Code Status: Full code Family Communication: None Disposition Plan: Status is: Inpatient  Remains inpatient appropriate because:IV treatments appropriate due to intensity of illness  or inability to take PO   Dispo: The patient is from: Home              Anticipated d/c is to: Home              Anticipated d/c date is: > 3 days              Patient currently is not medically stable to d/c.         Consultants:   Infectious disease  Interventional radiology  Procedures:   Aspiration of the SI joint  Antimicrobials:  Antibiotics Given (last 72 hours)    Date/Time Action Medication Dose Rate   12/15/19 1647 New Bag/Given   vancomycin (VANCOREADY) IVPB 1500 mg/300 mL 1,500 mg 150 mL/hr   12/16/19 0248 New Bag/Given   vancomycin (VANCOCIN) IVPB 1000 mg/200 mL premix 1,000 mg 200 mL/hr   12/16/19 1136 New Bag/Given   vancomycin (VANCOCIN) IVPB 1000 mg/200 mL premix 1,000 mg 200 mL/hr         Subjective: Patient was seen and examined.  He was still in the emergency room on my interview.  He was eating lunch comfortably.  He complained of severe pain on his left hip that goes to his left side of the chest.  He gets spasms on his back.  Objective: Vitals:   12/16/19 0900 12/16/19 1136 12/16/19 1353 12/16/19 1445  BP: 98/72 120/65 116/64 116/63  Pulse: 66  92 76  Resp: 13  18 18   Temp:   97.9 F (36.6 C) 98.2 F (36.8 C)  TempSrc:   Oral Oral  SpO2: 100%  99% 99%  Weight:  Height:        Intake/Output Summary (Last 24 hours) at 12/16/2019 1653 Last data filed at 12/16/2019 1344 Gross per 24 hour  Intake 200 ml  Output --  Net 200 ml   Filed Weights   12/15/19 1341  Weight: 79.4 kg    Examination:  General exam: Appears calm and comfortable  He was comfortably eating his lunch. A well-appearing young gentleman not in any distress. Respiratory system: Clear to auscultation. Respiratory effort normal. Cardiovascular system: S1 & S2 heard, RRR. No JVD, murmurs, rubs, gallops or clicks. No pedal edema. Gastrointestinal system: Abdomen is nondistended, soft and nontender. No organomegaly or masses felt. Normal bowel sounds  heard. Central nervous system: Alert and oriented. No focal neurological deficits.  Data Reviewed: I have personally reviewed following labs and imaging studies  CBC: Recent Labs  Lab 12/12/19 0300 12/13/19 0611 12/15/19 1413 12/16/19 0500  WBC 13.0* 11.8* 12.4* 11.9*  NEUTROABS 8.3*  --   --   --   HGB 10.5* 10.6* 10.1* 9.6*  HCT 33.2* 34.3* 33.7* 31.8*  MCV 81.6 82.3 83.6 83.5  PLT 390 404* 468* 709*   Basic Metabolic Panel: Recent Labs  Lab 12/12/19 0300 12/13/19 0611 12/15/19 1413 12/16/19 0500  NA 138 141 140 137  K 3.5 3.7 3.6 4.1  CL 107 107 105 105  CO2 24 24 24 25   GLUCOSE 107* 101* 103* 91  BUN 8 9 13 15   CREATININE 0.74 0.69 0.84 0.85  CALCIUM 8.5* 8.9 9.2 8.8*   GFR: Estimated Creatinine Clearance: 133.6 mL/min (by C-G formula based on SCr of 0.85 mg/dL). Liver Function Tests: Recent Labs  Lab 12/12/19 0300 12/15/19 1413  AST 18 26  ALT 13 21  ALKPHOS 104 100  BILITOT 0.1* 0.8  PROT 8.2* 7.6  ALBUMIN 3.1* 3.1*   No results for input(s): LIPASE, AMYLASE in the last 168 hours. No results for input(s): AMMONIA in the last 168 hours. Coagulation Profile: No results for input(s): INR, PROTIME in the last 168 hours. Cardiac Enzymes: No results for input(s): CKTOTAL, CKMB, CKMBINDEX, TROPONINI in the last 168 hours. BNP (last 3 results) No results for input(s): PROBNP in the last 8760 hours. HbA1C: No results for input(s): HGBA1C in the last 72 hours. CBG: No results for input(s): GLUCAP in the last 168 hours. Lipid Profile: No results for input(s): CHOL, HDL, LDLCALC, TRIG, CHOLHDL, LDLDIRECT in the last 72 hours. Thyroid Function Tests: No results for input(s): TSH, T4TOTAL, FREET4, T3FREE, THYROIDAB in the last 72 hours. Anemia Panel: No results for input(s): VITAMINB12, FOLATE, FERRITIN, TIBC, IRON, RETICCTPCT in the last 72 hours. Sepsis Labs: Recent Labs  Lab 12/12/19 0300 12/12/19 0452  LATICACIDVEN 2.0* 1.0    Recent Results  (from the past 240 hour(s))  SARS Coronavirus 2 by RT PCR (hospital order, performed in [facility] hospital lab) Nasopharyngeal Nasopharyngeal Swab     Status: None   Collection Time: 12/12/19  3:00 AM   Specimen: Nasopharyngeal Swab  Result Value Ref Range Status   SARS Coronavirus 2 NEGATIVE NEGATIVE Final    Comment: (NOTE) SARS-CoV-2 target nucleic acids are NOT DETECTED.  The SARS-CoV-2 RNA is generally detectable in upper and lower respiratory specimens during the acute phase of infection. The lowest concentration of SARS-CoV-2 viral copies this assay can detect is 250 copies / mL. A negative result does not preclude SARS-CoV-2 infection and should not be used as the sole basis for treatment or other patient management decisions.  A  negative result may occur with improper specimen collection / handling, submission of specimen other than nasopharyngeal swab, presence of viral mutation(s) within the areas targeted by this assay, and inadequate number of viral copies (<250 copies / mL). A negative result must be combined with clinical observations, patient history, and epidemiological information.  Fact Sheet for Patients:   StrictlyIdeas.no  Fact Sheet for Healthcare Providers: BankingDealers.co.za  This test is not yet approved or  cleared by the [place] FDA and has been authorized for detection and/or diagnosis of SARS-CoV-2 by FDA under an Emergency Use Authorization (EUA).  This EUA will remain in effect (meaning this test can be used) for the duration of the COVID-19 declaration under Section 564(b)(1) of the Act, 21 U.S.C. section 360bbb-3(b)(1), unless the authorization is terminated or revoked sooner.  Performed at [facility], [address], [place], [place] [address]   Culture, blood (single)     Status: Abnormal   Collection Time: 12/12/19  4:52 AM   Specimen: BLOOD  Result Value Ref Range Status   Specimen  Description   Final    BLOOD RIGHT ANTECUBITAL Performed at [facility], [address] [place], [place] [address]    Special Requests   Final    BOTTLES DRAWN AEROBIC AND ANAEROBIC Blood Culture adequate volume Performed at [facility], [address], [place], [place] [address]    Culture  Setup Time   Final    GRAM POSITIVE COCCI Gram Stain Report Called to,Read Back By and Verified With: MANNS,J @ 0037 ON 12/13/19 BY JUW AEROBIC BOTTLE ONLY GS DONE @ [facility] Organism ID to follow CRITICAL RESULT CALLED TO, READ BACK BY AND VERIFIED WITH: Melina Schools RN 12/13/19 0600 JDW Performed at [facility], [place] [address], [place], [place] [address]    Culture METHICILLIN RESISTANT STAPHYLOCOCCUS AUREUS (A)  Final   Report Status 12/15/2019 FINAL  Final   Organism ID, Bacteria METHICILLIN RESISTANT STAPHYLOCOCCUS AUREUS  Final      Susceptibility   Methicillin resistant staphylococcus aureus - MIC*    CIPROFLOXACIN >=8 RESISTANT Resistant     ERYTHROMYCIN >=8 RESISTANT Resistant     GENTAMICIN <=0.5 SENSITIVE Sensitive     OXACILLIN >=4 RESISTANT Resistant     TETRACYCLINE <=1 SENSITIVE Sensitive     VANCOMYCIN <=0.5 SENSITIVE Sensitive     TRIMETH/SULFA 160 RESISTANT Resistant     CLINDAMYCIN <=0.25 SENSITIVE Sensitive     RIFAMPIN <=0.5 SENSITIVE Sensitive     Inducible Clindamycin NEGATIVE Sensitive     * METHICILLIN RESISTANT STAPHYLOCOCCUS AUREUS  Blood Culture ID Panel (Reflexed)     Status: Abnormal   Collection Time: 12/12/19  4:52 AM  Result Value Ref Range Status   Enterococcus faecalis NOT DETECTED NOT DETECTED Final   Enterococcus Faecium NOT DETECTED NOT DETECTED Final   Listeria monocytogenes NOT DETECTED NOT DETECTED Final   Staphylococcus species DETECTED (A) NOT DETECTED Final    Comment: CRITICAL RESULT CALLED TO, READ BACK BY AND VERIFIED WITH: J MANNS RN 12/13/19 0600 JDW    Staphylococcus aureus (BCID) DETECTED (A) NOT DETECTED Final    Comment: Methicillin  (oxacillin)-resistant Staphylococcus aureus (MRSA). MRSA is predictably resistant to beta-lactam antibiotics (except ceftaroline). Preferred therapy is vancomycin unless clinically contraindicated. Patient requires contact precautions if  hospitalized. CRITICAL RESULT CALLED TO, READ BACK BY AND VERIFIED WITH: J MANNS RN 12/13/19 0600 JDW    Staphylococcus epidermidis NOT DETECTED NOT DETECTED Final   Staphylococcus lugdunensis NOT DETECTED NOT DETECTED  Final   Streptococcus species NOT DETECTED NOT DETECTED Final   Streptococcus agalactiae NOT DETECTED NOT DETECTED Final   Streptococcus pneumoniae NOT DETECTED NOT DETECTED Final   Streptococcus pyogenes NOT DETECTED NOT DETECTED Final   A.calcoaceticus-baumannii NOT DETECTED NOT DETECTED Final   Bacteroides fragilis NOT DETECTED NOT DETECTED Final   Enterobacterales NOT DETECTED NOT DETECTED Final   Enterobacter cloacae complex NOT DETECTED NOT DETECTED Final   Escherichia coli NOT DETECTED NOT DETECTED Final   Klebsiella aerogenes NOT DETECTED NOT DETECTED Final   Klebsiella oxytoca NOT DETECTED NOT DETECTED Final   Klebsiella pneumoniae NOT DETECTED NOT DETECTED Final   Proteus species NOT DETECTED NOT DETECTED Final   Salmonella species NOT DETECTED NOT DETECTED Final   Serratia marcescens NOT DETECTED NOT DETECTED Final   Haemophilus influenzae NOT DETECTED NOT DETECTED Final   Neisseria meningitidis NOT DETECTED NOT DETECTED Final   Pseudomonas aeruginosa NOT DETECTED NOT DETECTED Final   Stenotrophomonas maltophilia NOT DETECTED NOT DETECTED Final   Candida albicans NOT DETECTED NOT DETECTED Final   Candida auris NOT DETECTED NOT DETECTED Final   Candida glabrata NOT DETECTED NOT DETECTED Final   Candida krusei NOT DETECTED NOT DETECTED Final   Candida parapsilosis NOT DETECTED NOT DETECTED Final   Candida tropicalis NOT DETECTED NOT DETECTED Final   Cryptococcus neoformans/gattii NOT DETECTED NOT DETECTED Final   Meth  resistant mecA/C and MREJ DETECTED (A) NOT DETECTED Final    Comment: CRITICAL RESULT CALLED TO, READ BACK BY AND VERIFIED WITH: J MANNS RN 12/13/19 0600 JDW Performed at [facility], [address], [place], [place] [address]   Culture, blood (single) w Reflex to ID Panel     Status: Abnormal   Collection Time: 12/12/19 10:40 AM   Specimen: BLOOD  Result Value Ref Range Status   Specimen Description   Final    BLOOD RIGHT HAND Performed at [facility], [address] [place], [place] [address]    Special Requests   Final    BOTTLES DRAWN AEROBIC AND ANAEROBIC Blood Culture adequate volume Performed at [facility], [address] [place], [place] [address]    Culture  Setup Time   Final    GRAM POSITIVE COCCI Gram Stain Report Called to,Read Back By and Verified With: BULLINS L. @ 1010 ON [id number] BY HENDERSON L CRITICAL VALUE NOTED.  VALUE IS CONSISTENT WITH PREVIOUSLY REPORTED AND CALLED VALUE. Gram Stain Report Called to,Read Back By and Verified With: PREVIOUSLY CALLED ON 12/13/2019@1010BY  LH  KAY IN BOTH AEROBIC AND ANAEROBIC BOTTLES    Culture (A)  Final    STAPHYLOCOCCUS AUREUS SUSCEPTIBILITIES PERFORMED ON PREVIOUS CULTURE WITHIN THE LAST 5 DAYS. Performed at [facility], [place] [address], [place], [place] [address]    Report Status 12/15/2019 FINAL  Final  Culture, blood (Routine X 2) w Reflex to ID Panel     Status: None (Preliminary result)   Collection Time: 12/13/19  5:35 PM   Specimen: Right Antecubital; Blood  Result Value Ref Range Status   Specimen Description RIGHT ANTECUBITAL  Final   Special Requests   Final    BOTTLES DRAWN AEROBIC AND ANAEROBIC Blood Culture adequate volume   Culture   Final    NO GROWTH 2 DAYS Performed at [facility], [address], [place], [place] [address]    Report Status PENDING  Incomplete  Culture, blood (Routine X 2) w Reflex to ID Panel     Status: None (Preliminary result)  Collection Time: 12/13/19  5:35 PM    Specimen: BLOOD RIGHT HAND  Result Value Ref Range Status   Specimen Description BLOOD RIGHT HAND  Final   Special Requests   Final    BOTTLES DRAWN AEROBIC AND ANAEROBIC Blood Culture adequate volume   Culture   Final    NO GROWTH 2 DAYS Performed at [facility], [address], [place], [place] [address]    Report Status PENDING  Incomplete  Blood culture (routine x 2)     Status: None (Preliminary result)   Collection Time: 12/15/19  3:53 PM   Specimen: BLOOD  Result Value Ref Range Status   Specimen Description BLOOD LEFT UPPER ARM  Final   Special Requests   Final    BOTTLES DRAWN AEROBIC AND ANAEROBIC Blood Culture adequate volume   Culture   Final    NO GROWTH < 12 HOURS Performed at [facility], [place] [address] [place], [place] [address]    Report Status PENDING  Incomplete  Blood culture (routine x 2)     Status: None (Preliminary result)   Collection Time: 12/15/19  4:35 PM   Specimen: BLOOD LEFT HAND  Result Value Ref Range Status   Specimen Description BLOOD LEFT HAND  Final   Special Requests   Final    BOTTLES DRAWN AEROBIC AND ANAEROBIC Blood Culture adequate volume   Culture   Final    NO GROWTH < 12 HOURS Performed at [facility], [place] [address] [place],  [address]    Report Status PENDING  Incomplete  SARS Coronavirus 2 by RT PCR (hospital order, performed in [facility] hospital lab) Nasopharyngeal Nasopharyngeal Swab     Status: None   Collection Time: 12/15/19  4:46 PM   Specimen: Nasopharyngeal Swab  Result Value Ref Range Status   SARS Coronavirus 2 NEGATIVE NEGATIVE Final    Comment: (NOTE) SARS-CoV-2 target nucleic acids are NOT DETECTED.  The SARS-CoV-2 RNA is generally detectable in upper and lower respiratory specimens during the acute phase of infection. The lowest concentration of SARS-CoV-2 viral copies this assay can detect is 250 copies / mL. A negative result does not preclude SARS-CoV-2 infection and should not be  used as the sole basis for treatment or other patient management decisions.  A negative result may occur with improper specimen collection / handling, submission of specimen other than nasopharyngeal swab, presence of viral mutation(s) within the areas targeted by this assay, and inadequate number of viral copies (<250 copies / mL). A negative result must be combined with clinical observations, patient history, and epidemiological information.  Fact Sheet for Patients:   StrictlyIdeas.no  Fact Sheet for Healthcare Providers: BankingDealers.co.za  This test is not yet approved or  cleared by the [place] FDA and has been authorized for detection and/or diagnosis of SARS-CoV-2 by FDA under an Emergency Use Authorization (EUA).  This EUA will remain in effect (meaning this test can be used) for the duration of the COVID-19 declaration under Section 564(b)(1) of the Act, 21 U.S.C. section 360bbb-3(b)(1), unless the authorization is terminated or revoked sooner.  Performed at [facility], [place] [address] [place],  [address]          Radiology Studies: CT ASPIRATION  Result Date: 12/16/2019 INDICATION: 29 year old male with a history of left-sided hip pain, iliacus abscess, referred for aspiration EXAM: IMAGE GUIDED ASPIRATION OF LEFT PELVIC ABSCESS MEDICATIONS: The patient is currently admitted to the hospital and receiving  intravenous antibiotics. The antibiotics were administered within an appropriate time frame prior to the initiation of the procedure. ANESTHESIA/SEDATION: Fentanyl 1.0 mcg IV; Versed 50 mg IV Moderate Sedation Time:  12 minutes The patient was continuously monitored during the procedure by the interventional radiology nurse under my direct supervision. COMPLICATIONS: None PROCEDURE: Informed written consent was obtained from the patient after a thorough discussion of the procedural risks, benefits and  alternatives. All questions were addressed. Maximal Sterile Barrier Technique was utilized including caps, mask, sterile gowns, sterile gloves, sterile drape, hand hygiene and skin antiseptic. A timeout was performed prior to the initiation of the procedure. Patient positioned supine position on the CT gantry table. Scout CT was acquired for planning purposes. The patient was then prepped and draped in the usual sterile fashion. 1% lidocaine was used for local anesthesia. Using CT guidance, trocar needle was advanced from anterior inferior left pelvis to the fluid collection overlying the left sacroiliac joint. We confirmed needle tip placement with CT guidance, and then aspirated approximately 5-6 cc of murky bloody fluid. Sample sent for culture. Needle was removed and a sterile bandage was placed. Patient tolerated the procedure well and remained hemodynamically stable throughout. No complications were encountered and no significant blood loss. IMPRESSION: Status post CT-guided aspiration of left pelvic abscess. Signed, Dulcy Fanny. Dellia Nims, RPVI [facility] Electronically Signed   By: Corrie Mckusick D.O.   On: 12/16/2019 10:49        Scheduled Meds: . busPIRone  5 mg Oral BID  . cloNIDine  0.1 mg Oral QID   Followed by  . [START ON 12/18/2019] cloNIDine  0.1 mg Oral [id number]   Followed by  . [START ON 12/20/2019] cloNIDine  0.1 mg Oral QAC breakfast  . enoxaparin (LOVENOX) injection  40 mg Subcutaneous Q24H  . nicotine  21 mg Transdermal Daily  . sodium chloride flush  3 mL Intravenous Q12H   Continuous Infusions: . vancomycin Stopped (12/16/19 1344)     LOS: 1 day    Time spent: 30 minutes    Barb Merino, MD [facility] Pager [phone]

## 2019-12-17 DIAGNOSIS — T50904D Poisoning by unspecified drugs, medicaments and biological substances, undetermined, subsequent encounter: Secondary | ICD-10-CM

## 2019-12-17 DIAGNOSIS — M869 Osteomyelitis, unspecified: Secondary | ICD-10-CM

## 2019-12-17 NOTE — Progress Notes (Signed)
[facility]  Date of Admission:  12/15/2019      Total days of antibiotics 4  Vancomycin    ASSESSMENT: Jordan [name] is a 29 y.o. male with history of recent MRSA bacteremia and acute septic arthritis / osteomyelitis of the L SI Joint. Aspiration from L iliac muscle abscess growing staph aureus as well - suspect it is still MRSA.   He is much more comfortable today and understands his pain may never completely resolve. He is early in on treating his infection and will be a few weeks before he notices much improvement I suspect. He seems to be in a good place to commit to treatment at this time.   Follow blood cultures to determine PICC line placement ability for more durable long term venous access to deliver vanc.   No minor criteria for endocarditis seen on TTE. Had TEE on 11/24/2019. Will follow blood cultures, but he already has an indication for long term antibiotic treatment. Probably don't need to repeat TEE at this time unless something changes.    PLAN: 1. Continue vancomycin  2. Follow blood cultures for PICC timing    Principal Problem:   Acute osteomyelitis, pelvis (HCC) Active Problems:   Polysubstance abuse ([place])   Bacteremia   MRSA bacteremia   Drug overdose   Leukocytosis   Thrombocytosis ([place])   . busPIRone  5 mg Oral BID  . cloNIDine  0.1 mg Oral QID   Followed by  . [START ON 12/18/2019] cloNIDine  0.1 mg Oral BH-qamhs   Followed by  . [START ON 12/20/2019] cloNIDine  0.1 mg Oral QAC breakfast  . enoxaparin (LOVENOX) injection  40 mg Subcutaneous Q24H  . nicotine  21 mg Transdermal Daily  . sodium chloride flush  3 mL Intravenous Q12H    SUBJECTIVE: Much more comfortable. No new pain.   Review of Systems: Review of Systems  Constitutional: Negative for chills, fever, malaise/fatigue and weight loss.  HENT: Negative for sore throat.        No dental problems  Respiratory: Negative for cough and sputum  production.   Cardiovascular: Negative for chest pain and leg swelling.  Gastrointestinal: Negative for abdominal pain, diarrhea and vomiting.  Genitourinary: Negative for dysuria and flank pain.  Musculoskeletal: Positive for back pain and joint pain. Negative for myalgias and neck pain.  Skin: Negative for rash.  Neurological: Negative for dizziness, tingling and headaches.  Psychiatric/Behavioral: Negative for depression and substance abuse. The patient is not nervous/anxious and does not have insomnia.     No Known Allergies  OBJECTIVE: Vitals:   12/16/19 1922 12/16/19 2300 12/17/19 0417 12/17/19 0939  BP: 116/66 118/71 116/65 118/69  Pulse: 75 73 64 64  Resp: 18 18 17 18   Temp: 98 F (36.7 C) 98.4 F (36.9 C) 98.2 F (36.8 C) 97.9 F (36.6 C)  TempSrc: Oral Oral  Oral  SpO2: 100% 100% 100% 100%  Weight: 79.4 kg     Height:       Body mass index is 25.12 kg/m.  Physical Exam Vitals reviewed.  HENT:     Mouth/Throat:     Mouth: No oral lesions.     Dentition: Normal dentition. No dental caries.  Eyes:     General: No scleral icterus. Cardiovascular:     Rate and Rhythm: Normal rate and regular rhythm.     Heart sounds: Normal heart sounds.  Pulmonary:     Effort:  Pulmonary effort is normal.     Breath sounds: Normal breath sounds.  Abdominal:     General: There is no distension.     Palpations: Abdomen is soft.     Tenderness: There is no abdominal tenderness.  Lymphadenopathy:     Cervical: No cervical adenopathy.  Skin:    General: Skin is warm and dry.     Findings: No rash.  Neurological:     Mental Status: He is alert and oriented to person, place, and time.     Lab Results Lab Results  Component Value Date   WBC 11.9 (H) 12/16/2019   HGB 9.6 (L) 12/16/2019   HCT 31.8 (L) 12/16/2019   MCV 83.5 12/16/2019   PLT 406 (H) 12/16/2019    Lab Results  Component Value Date   CREATININE 0.85 12/16/2019   BUN 15 12/16/2019   NA 137 12/16/2019   K  4.1 12/16/2019   CL 105 12/16/2019   CO2 25 12/16/2019    Lab Results  Component Value Date   ALT 21 12/15/2019   AST 26 12/15/2019   ALKPHOS 100 12/15/2019   BILITOT 0.8 12/15/2019     Microbiology: Recent Results (from the past 240 hour(s))  SARS Coronavirus 2 by RT PCR (hospital order, performed in [facility] hospital lab) Nasopharyngeal Nasopharyngeal Swab     Status: None   Collection Time: 12/12/19  3:00 AM   Specimen: Nasopharyngeal Swab  Result Value Ref Range Status   SARS Coronavirus 2 NEGATIVE NEGATIVE Final    Comment: (NOTE) SARS-CoV-2 target nucleic acids are NOT DETECTED.  The SARS-CoV-2 RNA is generally detectable in upper and lower respiratory specimens during the acute phase of infection. The lowest concentration of SARS-CoV-2 viral copies this assay can detect is 250 copies / mL. A negative result does not preclude SARS-CoV-2 infection and should not be used as the sole basis for treatment or other patient management decisions.  A negative result may occur with improper specimen collection / handling, submission of specimen other than nasopharyngeal swab, presence of viral mutation(s) within the areas targeted by this assay, and inadequate number of viral copies (<250 copies / mL). A negative result must be combined with clinical observations, patient history, and epidemiological information.  Fact Sheet for Patients:   StrictlyIdeas.no  Fact Sheet for Healthcare Providers: BankingDealers.co.za  This test is not yet approved or  cleared by the [place] FDA and has been authorized for detection and/or diagnosis of SARS-CoV-2 by FDA under an Emergency Use Authorization (EUA).  This EUA will remain in effect (meaning this test can be used) for the duration of the COVID-19 declaration under Section 564(b)(1) of the Act, 21 U.S.C. section 360bbb-3(b)(1), unless the authorization is terminated or revoked  sooner.  Performed at [facility], [address], [place], [place] [address]   Culture, blood (single)     Status: Abnormal   Collection Time: 12/12/19  4:52 AM   Specimen: BLOOD  Result Value Ref Range Status   Specimen Description   Final    BLOOD RIGHT ANTECUBITAL Performed at [facility], [address], [place], [place] [address]    Special Requests   Final    BOTTLES DRAWN AEROBIC AND ANAEROBIC Blood Culture adequate volume Performed at [facility], [address] [place],  [address]    Culture  Setup Time   Final    GRAM POSITIVE COCCI Gram Stain Report Called to,Read Back By and Verified With: MANNS,J @ 9024 ON 12/13/19  BY JUW AEROBIC BOTTLE ONLY GS DONE @ [facility] Organism ID to follow CRITICAL RESULT CALLED TO, READ BACK BY AND VERIFIED WITH: Melina Schools RN 12/13/19 0600 JDW Performed at [facility], [address], [place], [place] [address]    Culture METHICILLIN RESISTANT STAPHYLOCOCCUS AUREUS (A)  Final   Report Status 12/15/2019 FINAL  Final   Organism ID, Bacteria METHICILLIN RESISTANT STAPHYLOCOCCUS AUREUS  Final      Susceptibility   Methicillin resistant staphylococcus aureus - MIC*    CIPROFLOXACIN >=8 RESISTANT Resistant     ERYTHROMYCIN >=8 RESISTANT Resistant     GENTAMICIN <=0.5 SENSITIVE Sensitive     OXACILLIN >=4 RESISTANT Resistant     TETRACYCLINE <=1 SENSITIVE Sensitive     VANCOMYCIN <=0.5 SENSITIVE Sensitive     TRIMETH/SULFA 160 RESISTANT Resistant     CLINDAMYCIN <=0.25 SENSITIVE Sensitive     RIFAMPIN <=0.5 SENSITIVE Sensitive     Inducible Clindamycin NEGATIVE Sensitive     * METHICILLIN RESISTANT STAPHYLOCOCCUS AUREUS  Blood Culture ID Panel (Reflexed)     Status: Abnormal   Collection Time: 12/12/19  4:52 AM  Result Value Ref Range Status   Enterococcus faecalis NOT DETECTED NOT DETECTED Final   Enterococcus Faecium NOT DETECTED NOT DETECTED Final   Listeria monocytogenes NOT DETECTED NOT DETECTED Final   Staphylococcus species  DETECTED (A) NOT DETECTED Final    Comment: CRITICAL RESULT CALLED TO, READ BACK BY AND VERIFIED WITH: J MANNS RN 12/13/19 0600 JDW    Staphylococcus aureus (BCID) DETECTED (A) NOT DETECTED Final    Comment: Methicillin (oxacillin)-resistant Staphylococcus aureus (MRSA). MRSA is predictably resistant to beta-lactam antibiotics (except ceftaroline). Preferred therapy is vancomycin unless clinically contraindicated. Patient requires contact precautions if  hospitalized. CRITICAL RESULT CALLED TO, READ BACK BY AND VERIFIED WITH: J MANNS RN 12/13/19 0600 JDW    Staphylococcus epidermidis NOT DETECTED NOT DETECTED Final   Staphylococcus lugdunensis NOT DETECTED NOT DETECTED Final   Streptococcus species NOT DETECTED NOT DETECTED Final   Streptococcus agalactiae NOT DETECTED NOT DETECTED Final   Streptococcus pneumoniae NOT DETECTED NOT DETECTED Final   Streptococcus pyogenes NOT DETECTED NOT DETECTED Final   A.calcoaceticus-baumannii NOT DETECTED NOT DETECTED Final   Bacteroides fragilis NOT DETECTED NOT DETECTED Final   Enterobacterales NOT DETECTED NOT DETECTED Final   Enterobacter cloacae complex NOT DETECTED NOT DETECTED Final   Escherichia coli NOT DETECTED NOT DETECTED Final   Klebsiella aerogenes NOT DETECTED NOT DETECTED Final   Klebsiella oxytoca NOT DETECTED NOT DETECTED Final   Klebsiella pneumoniae NOT DETECTED NOT DETECTED Final   Proteus species NOT DETECTED NOT DETECTED Final   Salmonella species NOT DETECTED NOT DETECTED Final   Serratia marcescens NOT DETECTED NOT DETECTED Final   Haemophilus influenzae NOT DETECTED NOT DETECTED Final   Neisseria meningitidis NOT DETECTED NOT DETECTED Final   Pseudomonas aeruginosa NOT DETECTED NOT DETECTED Final   Stenotrophomonas maltophilia NOT DETECTED NOT DETECTED Final   Candida albicans NOT DETECTED NOT DETECTED Final   Candida auris NOT DETECTED NOT DETECTED Final   Candida glabrata NOT DETECTED NOT DETECTED Final   Candida krusei  NOT DETECTED NOT DETECTED Final   Candida parapsilosis NOT DETECTED NOT DETECTED Final   Candida tropicalis NOT DETECTED NOT DETECTED Final   Cryptococcus neoformans/gattii NOT DETECTED NOT DETECTED Final   Meth resistant mecA/C and MREJ DETECTED (A) NOT DETECTED Final    Comment: CRITICAL RESULT CALLED TO, READ BACK BY AND VERIFIED WITH: J MANNS RN 12/13/19 0600  JDW Performed at [facility], [place] [address], [place], [place] [address]   Culture, blood (single) w Reflex to ID Panel     Status: Abnormal   Collection Time: 12/12/19 10:40 AM   Specimen: BLOOD  Result Value Ref Range Status   Specimen Description   Final    BLOOD RIGHT HAND Performed at [facility], [address] [place], [place] [address]    Special Requests   Final    BOTTLES DRAWN AEROBIC AND ANAEROBIC Blood Culture adequate volume Performed at [facility], [address] [place], [place] [address]    Culture  Setup Time   Final    GRAM POSITIVE COCCI Gram Stain Report Called to,Read Back By and Verified With: BULLINS L. @ 1010 ON [id number] BY HENDERSON L CRITICAL VALUE NOTED.  VALUE IS CONSISTENT WITH PREVIOUSLY REPORTED AND CALLED VALUE. Gram Stain Report Called to,Read Back By and Verified With: PREVIOUSLY CALLED ON 12/13/2019@1010BY  LH  KAY IN BOTH AEROBIC AND ANAEROBIC BOTTLES    Culture (A)  Final    STAPHYLOCOCCUS AUREUS SUSCEPTIBILITIES PERFORMED ON PREVIOUS CULTURE WITHIN THE LAST 5 DAYS. Performed at [facility], [place] [address] [place], [place] [address]    Report Status 12/15/2019 FINAL  Final  Culture, blood (Routine X 2) w Reflex to ID Panel     Status: None (Preliminary result)   Collection Time: 12/13/19  5:35 PM   Specimen: Right Antecubital; Blood  Result Value Ref Range Status   Specimen Description RIGHT ANTECUBITAL  Final   Special Requests   Final    BOTTLES DRAWN AEROBIC AND ANAEROBIC Blood Culture adequate volume   Culture   Final    NO GROWTH 4 DAYS Performed at [facility], [address], [place], [place] [address]    Report Status PENDING  Incomplete  Culture, blood (Routine X 2) w Reflex to ID Panel     Status: None (Preliminary result)   Collection Time: 12/13/19  5:35 PM   Specimen: BLOOD RIGHT HAND  Result Value Ref Range Status   Specimen Description BLOOD RIGHT HAND  Final   Special Requests   Final    BOTTLES DRAWN AEROBIC AND ANAEROBIC Blood Culture adequate volume   Culture   Final    NO GROWTH 4 DAYS Performed at [facility], [address] [place], [place] [address]    Report Status PENDING  Incomplete  Blood culture (routine x 2)     Status: None (Preliminary result)   Collection Time: 12/15/19  3:53 PM   Specimen: BLOOD  Result Value Ref Range Status   Specimen Description BLOOD LEFT UPPER ARM  Final   Special Requests   Final    BOTTLES DRAWN AEROBIC AND ANAEROBIC Blood Culture adequate volume   Culture   Final    NO GROWTH 2 DAYS Performed at [facility], [place] [address], [place], [place] [address]    Report Status PENDING  Incomplete  Blood culture (routine x 2)     Status: None (Preliminary result)   Collection Time: 12/15/19  4:35 PM   Specimen: BLOOD LEFT HAND  Result Value Ref Range Status   Specimen Description BLOOD LEFT HAND  Final   Special Requests   Final    BOTTLES DRAWN AEROBIC AND ANAEROBIC Blood Culture adequate volume   Culture   Final    NO GROWTH 2 DAYS Performed at [facility], [place] [address], [place], [place] [address]  Report Status PENDING  Incomplete  SARS Coronavirus 2 by RT PCR (hospital order, performed in [facility] hospital lab) Nasopharyngeal Nasopharyngeal Swab     Status: None   Collection Time: 12/15/19  4:46 PM   Specimen: Nasopharyngeal Swab  Result Value Ref Range Status   SARS Coronavirus 2 NEGATIVE NEGATIVE Final    Comment: (NOTE) SARS-CoV-2 target nucleic acids are NOT DETECTED.  The SARS-CoV-2 RNA is generally detectable in upper and lower respiratory  specimens during the acute phase of infection. The lowest concentration of SARS-CoV-2 viral copies this assay can detect is 250 copies / mL. A negative result does not preclude SARS-CoV-2 infection and should not be used as the sole basis for treatment or other patient management decisions.  A negative result may occur with improper specimen collection / handling, submission of specimen other than nasopharyngeal swab, presence of viral mutation(s) within the areas targeted by this assay, and inadequate number of viral copies (<250 copies / mL). A negative result must be combined with clinical observations, patient history, and epidemiological information.  Fact Sheet for Patients:   StrictlyIdeas.no  Fact Sheet for Healthcare Providers: BankingDealers.co.za  This test is not yet approved or  cleared by the [place] FDA and has been authorized for detection and/or diagnosis of SARS-CoV-2 by FDA under an Emergency Use Authorization (EUA).  This EUA will remain in effect (meaning this test can be used) for the duration of the COVID-19 declaration under Section 564(b)(1) of the Act, 21 U.S.C. section 360bbb-3(b)(1), unless the authorization is terminated or revoked sooner.  Performed at [facility], [place] [address], [place], [place] [address]   Aerobic/Anaerobic Culture (surgical/deep wound)     Status: None (Preliminary result)   Collection Time: 12/16/19  9:02 AM   Specimen: Wound; Abscess  Result Value Ref Range Status   Specimen Description WOUND  Final   Special Requests LEFT ILIACUS MUSCLE ABCESS  Final   Gram Stain PENDING  Incomplete   Culture   Final    MODERATE STAPHYLOCOCCUS AUREUS SUSCEPTIBILITIES TO FOLLOW Performed at [facility], [address], [place], [place] [address]    Report Status PENDING  Incomplete     Janene Madeira, MSN, NP-C [facility] [email or web address] Pager: [phone] Office: [phone] [place]: [phone]

## 2019-12-17 NOTE — Progress Notes (Signed)
PROGRESS NOTE    Jordan [name]  WJX:[id number] DOB: 1991/04/22 DOA: 12/15/2019 PCP: Patient, No Pcp Per    Brief Narrative:  29 year old male with IV drug use, hospitalized on 7/19-7/27 for MRSA bacteremia, TEE negative, treated with vancomycin and then discharged on Zyvox.  Readmitted with fever and worsening buttock pain on 8/13 growing MRSA bacteremia and left AMA.  MRI at that time was consistent with osteomyelitis of the left SI joint with underlying collection.  He was found passed out in front of [facility] on the ground and unresponsive.  Was using fentanyl prior to that.  He was given Narcan and brought to the ER.  Assessment & Plan:  MRSA bacteremia/IV drug abuse Left pelvic, SI joint septic arthritis/osteomyelitis -Blood cultures from recent hospitalization with MRSA, aspiration from left iliac muscle abscess growing staph aureus as well, most likely MRSA -Transthoracic echo unremarkable, TEE on 7/26 was negative -Repeat blood cultures -Would not be a good candidate for discharge with PICC line given long history of IV heroin abuse -ID following  IV drug use with high risk of opiate withdrawals: Uses large amounts of fentanyl, cocaine, methamphetamine, benzodiazepine. -Patient was started on opiate withdrawal protocol with clonidine, baclofen, NSAIDs.   -Continue current dose of oral opiates -Will discuss Suboxone at discharge -Counseled  DVT prophylaxis: enoxaparin (LOVENOX) injection 40 mg Start: 12/15/19 2200  Code Status: Full code Family Communication: None Disposition Plan: Status is: Inpatient  Remains inpatient appropriate because: IV drug abuse needing long-term IV antibiotics  Dispo: The patient is from: Home              Anticipated d/c is to: Home              Anticipated d/c date is: > 3 days              Patient currently is not medically stable to d/c.  Consultants:   Infectious disease  Interventional radiology  Procedures:   Aspiration of the SI  joint  Antimicrobials:  Antibiotics Given (last 72 hours)    Date/Time Action Medication Dose Rate   12/15/19 1647 New Bag/Given   vancomycin (VANCOREADY) IVPB 1500 mg/300 mL 1,500 mg 150 mL/hr   12/16/19 0248 New Bag/Given   vancomycin (VANCOCIN) IVPB 1000 mg/200 mL premix 1,000 mg 200 mL/hr   12/16/19 1136 New Bag/Given   vancomycin (VANCOCIN) IVPB 1000 mg/200 mL premix 1,000 mg 200 mL/hr   12/16/19 1916 New Bag/Given   vancomycin (VANCOCIN) IVPB 1000 mg/200 mL premix 1,000 mg 200 mL/hr   12/17/19 0314 New Bag/Given   vancomycin (VANCOCIN) IVPB 1000 mg/200 mL premix 1,000 mg 200 mL/hr   12/17/19 1015 New Bag/Given   vancomycin (VANCOCIN) IVPB 1000 mg/200 mL premix 1,000 mg 200 mL/hr         Subjective: Complains of pain in his left hip and back spasms  Objective: Vitals:   12/16/19 1922 12/16/19 2300 12/17/19 0417 12/17/19 0939  BP: 116/66 118/71 116/65 118/69  Pulse: 75 73 64 64  Resp: 18 18 17 18   Temp: 98 F (36.7 C) 98.4 F (36.9 C) 98.2 F (36.8 C) 97.9 F (36.6 C)  TempSrc: Oral Oral  Oral  SpO2: 100% 100% 100% 100%  Weight: 79.4 kg     Height:        Intake/Output Summary (Last 24 hours) at 12/17/2019 1503 Last data filed at 12/17/2019 1300 Gross per 24 hour  Intake 1160 ml  Output 500 ml  Net 660 ml   Filed  Weights   12/15/19 1341 12/16/19 1922  Weight: 79.4 kg 79.4 kg    Examination:  General exam: Pleasant young male, laying comfortably in bed, no distress HEENT: No JVD CVS: S1-S2, regular rhythm, no murmurs Lungs: Clear bilaterally Abdomen: Soft, nontender, bowel sounds present Extremities: No edema Neuro: Moves all extremities: No localizing signs   Data Reviewed: I have personally reviewed following labs and imaging studies  CBC: Recent Labs  Lab 12/12/19 0300 12/13/19 0611 12/15/19 1413 12/16/19 0500  WBC 13.0* 11.8* 12.4* 11.9*  NEUTROABS 8.3*  --   --   --   HGB 10.5* 10.6* 10.1* 9.6*  HCT 33.2* 34.3* 33.7* 31.8*  MCV 81.6  82.3 83.6 83.5  PLT 390 404* 468* 283*   Basic Metabolic Panel: Recent Labs  Lab 12/12/19 0300 12/13/19 0611 12/15/19 1413 12/16/19 0500  NA 138 141 140 137  K 3.5 3.7 3.6 4.1  CL 107 107 105 105  CO2 24 24 24 25   GLUCOSE 107* 101* 103* 91  BUN 8 9 13 15   CREATININE 0.74 0.69 0.84 0.85  CALCIUM 8.5* 8.9 9.2 8.8*   GFR: Estimated Creatinine Clearance: 133.6 mL/min (by C-G formula based on SCr of 0.85 mg/dL). Liver Function Tests: Recent Labs  Lab 12/12/19 0300 12/15/19 1413  AST 18 26  ALT 13 21  ALKPHOS 104 100  BILITOT 0.1* 0.8  PROT 8.2* 7.6  ALBUMIN 3.1* 3.1*   No results for input(s): LIPASE, AMYLASE in the last 168 hours. No results for input(s): AMMONIA in the last 168 hours. Coagulation Profile: No results for input(s): INR, PROTIME in the last 168 hours. Cardiac Enzymes: No results for input(s): CKTOTAL, CKMB, CKMBINDEX, TROPONINI in the last 168 hours. BNP (last 3 results) No results for input(s): PROBNP in the last 8760 hours. HbA1C: No results for input(s): HGBA1C in the last 72 hours. CBG: No results for input(s): GLUCAP in the last 168 hours. Lipid Profile: No results for input(s): CHOL, HDL, LDLCALC, TRIG, CHOLHDL, LDLDIRECT in the last 72 hours. Thyroid Function Tests: No results for input(s): TSH, T4TOTAL, FREET4, T3FREE, THYROIDAB in the last 72 hours. Anemia Panel: No results for input(s): VITAMINB12, FOLATE, FERRITIN, TIBC, IRON, RETICCTPCT in the last 72 hours. Sepsis Labs: Recent Labs  Lab 12/12/19 0300 12/12/19 0452  LATICACIDVEN 2.0* 1.0    Recent Results (from the past 240 hour(s))  SARS Coronavirus 2 by RT PCR (hospital order, performed in [facility] hospital lab) Nasopharyngeal Nasopharyngeal Swab     Status: None   Collection Time: 12/12/19  3:00 AM   Specimen: Nasopharyngeal Swab  Result Value Ref Range Status   SARS Coronavirus 2 NEGATIVE NEGATIVE Final    Comment: (NOTE) SARS-CoV-2 target nucleic acids are NOT  DETECTED.  The SARS-CoV-2 RNA is generally detectable in upper and lower respiratory specimens during the acute phase of infection. The lowest concentration of SARS-CoV-2 viral copies this assay can detect is 250 copies / mL. A negative result does not preclude SARS-CoV-2 infection and should not be used as the sole basis for treatment or other patient management decisions.  A negative result may occur with improper specimen collection / handling, submission of specimen other than nasopharyngeal swab, presence of viral mutation(s) within the areas targeted by this assay, and inadequate number of viral copies (<250 copies / mL). A negative result must be combined with clinical observations, patient history, and epidemiological information.  Fact Sheet for Patients:   StrictlyIdeas.no  Fact Sheet for Healthcare Providers: BankingDealers.co.za  This  test is not yet approved or  cleared by the [place] and has been authorized for detection and/or diagnosis of SARS-CoV-2 by FDA under an Emergency Use Authorization (EUA).  This EUA will remain in effect (meaning this test can be used) for the duration of the COVID-19 declaration under Section 564(b)(1) of the Act, 21 U.S.C. section 360bbb-3(b)(1), unless the authorization is terminated or revoked sooner.  Performed at [facility], [address], [place], [place] [address]   Culture, blood (single)     Status: Abnormal   Collection Time: 12/12/19  4:52 AM   Specimen: BLOOD  Result Value Ref Range Status   Specimen Description   Final    BLOOD RIGHT ANTECUBITAL Performed at [facility], [address], [place], [place] [address]    Special Requests   Final    BOTTLES DRAWN AEROBIC AND ANAEROBIC Blood Culture adequate volume Performed at [facility], [address] [place] [address] [place], [place] [address]    Culture  Setup Time   Final    GRAM POSITIVE COCCI Gram Stain Report Called  to,Read Back By and Verified With: MANNS,J @ 0037 ON 12/13/19 BY JUW AEROBIC BOTTLE ONLY GS DONE @ [facility] Organism ID to follow CRITICAL RESULT CALLED TO, READ BACK BY AND VERIFIED WITH: Melina Schools RN 12/13/19 0600 JDW Performed at [facility], [place] [address] [place], [place] [address]    Culture METHICILLIN RESISTANT STAPHYLOCOCCUS AUREUS (A)  Final   Report Status 12/15/2019 FINAL  Final   Organism ID, Bacteria METHICILLIN RESISTANT STAPHYLOCOCCUS AUREUS  Final      Susceptibility   Methicillin resistant staphylococcus aureus - MIC*    CIPROFLOXACIN >=8 RESISTANT Resistant     ERYTHROMYCIN >=8 RESISTANT Resistant     GENTAMICIN <=0.5 SENSITIVE Sensitive     OXACILLIN >=4 RESISTANT Resistant     TETRACYCLINE <=1 SENSITIVE Sensitive     VANCOMYCIN <=0.5 SENSITIVE Sensitive     TRIMETH/SULFA 160 RESISTANT Resistant     CLINDAMYCIN <=0.25 SENSITIVE Sensitive     RIFAMPIN <=0.5 SENSITIVE Sensitive     Inducible Clindamycin NEGATIVE Sensitive     * METHICILLIN RESISTANT STAPHYLOCOCCUS AUREUS  Blood Culture ID Panel (Reflexed)     Status: Abnormal   Collection Time: 12/12/19  4:52 AM  Result Value Ref Range Status   Enterococcus faecalis NOT DETECTED NOT DETECTED Final   Enterococcus Faecium NOT DETECTED NOT DETECTED Final   Listeria monocytogenes NOT DETECTED NOT DETECTED Final   Staphylococcus species DETECTED (A) NOT DETECTED Final    Comment: CRITICAL RESULT CALLED TO, READ BACK BY AND VERIFIED WITH: J MANNS RN 12/13/19 0600 JDW    Staphylococcus aureus (BCID) DETECTED (A) NOT DETECTED Final    Comment: Methicillin (oxacillin)-resistant Staphylococcus aureus (MRSA). MRSA is predictably resistant to beta-lactam antibiotics (except ceftaroline). Preferred therapy is vancomycin unless clinically contraindicated. Patient requires contact precautions if  hospitalized. CRITICAL RESULT CALLED TO, READ BACK BY AND VERIFIED WITH: J MANNS RN 12/13/19 0600 JDW    Staphylococcus epidermidis  NOT DETECTED NOT DETECTED Final   Staphylococcus lugdunensis NOT DETECTED NOT DETECTED Final   Streptococcus species NOT DETECTED NOT DETECTED Final   Streptococcus agalactiae NOT DETECTED NOT DETECTED Final   Streptococcus pneumoniae NOT DETECTED NOT DETECTED Final   Streptococcus pyogenes NOT DETECTED NOT DETECTED Final   A.calcoaceticus-baumannii NOT DETECTED NOT DETECTED Final   Bacteroides fragilis NOT DETECTED NOT DETECTED Final   Enterobacterales NOT DETECTED NOT DETECTED Final   Enterobacter cloacae complex NOT DETECTED  NOT DETECTED Final   Escherichia coli NOT DETECTED NOT DETECTED Final   Klebsiella aerogenes NOT DETECTED NOT DETECTED Final   Klebsiella oxytoca NOT DETECTED NOT DETECTED Final   Klebsiella pneumoniae NOT DETECTED NOT DETECTED Final   Proteus species NOT DETECTED NOT DETECTED Final   Salmonella species NOT DETECTED NOT DETECTED Final   Serratia marcescens NOT DETECTED NOT DETECTED Final   Haemophilus influenzae NOT DETECTED NOT DETECTED Final   Neisseria meningitidis NOT DETECTED NOT DETECTED Final   Pseudomonas aeruginosa NOT DETECTED NOT DETECTED Final   Stenotrophomonas maltophilia NOT DETECTED NOT DETECTED Final   Candida albicans NOT DETECTED NOT DETECTED Final   Candida auris NOT DETECTED NOT DETECTED Final   Candida glabrata NOT DETECTED NOT DETECTED Final   Candida krusei NOT DETECTED NOT DETECTED Final   Candida parapsilosis NOT DETECTED NOT DETECTED Final   Candida tropicalis NOT DETECTED NOT DETECTED Final   Cryptococcus neoformans/gattii NOT DETECTED NOT DETECTED Final   Meth resistant mecA/C and MREJ DETECTED (A) NOT DETECTED Final    Comment: CRITICAL RESULT CALLED TO, READ BACK BY AND VERIFIED WITH: J MANNS RN 12/13/19 0600 JDW Performed at [facility], [address] [place], [place] [address]   Culture, blood (single) w Reflex to ID Panel     Status: Abnormal   Collection Time: 12/12/19 10:40 AM   Specimen: BLOOD  Result Value Ref  Range Status   Specimen Description   Final    BLOOD RIGHT HAND Performed at [facility], [address] [place], [place] [address]    Special Requests   Final    BOTTLES DRAWN AEROBIC AND ANAEROBIC Blood Culture adequate volume Performed at [facility], [address] [place], [place] [address]    Culture  Setup Time   Final    GRAM POSITIVE COCCI Gram Stain Report Called to,Read Back By and Verified With: BULLINS L. @ 1010 ON [id number] BY HENDERSON L CRITICAL VALUE NOTED.  VALUE IS CONSISTENT WITH PREVIOUSLY REPORTED AND CALLED VALUE. Gram Stain Report Called to,Read Back By and Verified With: PREVIOUSLY CALLED ON 12/13/2019@1010BY  LH  KAY IN BOTH AEROBIC AND ANAEROBIC BOTTLES    Culture (A)  Final    STAPHYLOCOCCUS AUREUS SUSCEPTIBILITIES PERFORMED ON PREVIOUS CULTURE WITHIN THE LAST 5 DAYS. Performed at [facility], [place] [address], [place], [place] [address]    Report Status 12/15/2019 FINAL  Final  Culture, blood (Routine X 2) w Reflex to ID Panel     Status: None (Preliminary result)   Collection Time: 12/13/19  5:35 PM   Specimen: Right Antecubital; Blood  Result Value Ref Range Status   Specimen Description RIGHT ANTECUBITAL  Final   Special Requests   Final    BOTTLES DRAWN AEROBIC AND ANAEROBIC Blood Culture adequate volume   Culture   Final    NO GROWTH 4 DAYS Performed at [facility], [address], [place], [place] [address]    Report Status PENDING  Incomplete  Culture, blood (Routine X 2) w Reflex to ID Panel     Status: None (Preliminary result)   Collection Time: 12/13/19  5:35 PM   Specimen: BLOOD RIGHT HAND  Result Value Ref Range Status   Specimen Description BLOOD RIGHT HAND  Final   Special Requests   Final    BOTTLES DRAWN AEROBIC AND ANAEROBIC Blood Culture adequate volume   Culture   Final    NO GROWTH 4 DAYS Performed at [facility], [address]  [place], [place] [address]    Report Status PENDING  Incomplete  Blood culture (routine x  2)     Status: None (Preliminary result)   Collection Time: 12/15/19  3:53 PM   Specimen: BLOOD  Result Value Ref Range Status   Specimen Description BLOOD LEFT UPPER ARM  Final   Special Requests   Final    BOTTLES DRAWN AEROBIC AND ANAEROBIC Blood Culture adequate volume   Culture   Final    NO GROWTH 2 DAYS Performed at [facility], [address] [place], [place] [address]    Report Status PENDING  Incomplete  Blood culture (routine x 2)     Status: None (Preliminary result)   Collection Time: 12/15/19  4:35 PM   Specimen: BLOOD LEFT HAND  Result Value Ref Range Status   Specimen Description BLOOD LEFT HAND  Final   Special Requests   Final    BOTTLES DRAWN AEROBIC AND ANAEROBIC Blood Culture adequate volume   Culture   Final    NO GROWTH 2 DAYS Performed at [facility], [place] [address] [place], [place] [address]    Report Status PENDING  Incomplete  SARS Coronavirus 2 by RT PCR (hospital order, performed in [facility] hospital lab) Nasopharyngeal Nasopharyngeal Swab     Status: None   Collection Time: 12/15/19  4:46 PM   Specimen: Nasopharyngeal Swab  Result Value Ref Range Status   SARS Coronavirus 2 NEGATIVE NEGATIVE Final    Comment: (NOTE) SARS-CoV-2 target nucleic acids are NOT DETECTED.  The SARS-CoV-2 RNA is generally detectable in upper and lower respiratory specimens during the acute phase of infection. The lowest concentration of SARS-CoV-2 viral copies this assay can detect is 250 copies / mL. A negative result does not preclude SARS-CoV-2 infection and should not be used as the sole basis for treatment or other patient management decisions.  A negative result may occur with improper specimen collection / handling, submission of specimen other than nasopharyngeal swab, presence of viral mutation(s) within the areas targeted by this assay, and inadequate number of viral copies (<250 copies / mL). A negative result must be combined with  clinical observations, patient history, and epidemiological information.  Fact Sheet for Patients:   StrictlyIdeas.no  Fact Sheet for Healthcare Providers: BankingDealers.co.za  This test is not yet approved or  cleared by the [place] FDA and has been authorized for detection and/or diagnosis of SARS-CoV-2 by FDA under an Emergency Use Authorization (EUA).  This EUA will remain in effect (meaning this test can be used) for the duration of the COVID-19 declaration under Section 564(b)(1) of the Act, 21 U.S.C. section 360bbb-3(b)(1), unless the authorization is terminated or revoked sooner.  Performed at [facility], [place] [address], [place], [place] [address]   Aerobic/Anaerobic Culture (surgical/deep wound)     Status: None (Preliminary result)   Collection Time: 12/16/19  9:02 AM   Specimen: Wound; Abscess  Result Value Ref Range Status   Specimen Description WOUND  Final   Special Requests LEFT ILIACUS MUSCLE ABCESS  Final   Gram Stain PENDING  Incomplete   Culture   Final    MODERATE STAPHYLOCOCCUS AUREUS SUSCEPTIBILITIES TO FOLLOW Performed at [facility], [address] [place], [place] [address]    Report Status PENDING  Incomplete         Radiology Studies: CT ASPIRATION  Result Date: 12/16/2019 INDICATION: 29 year old male with a history of left-sided hip pain, iliacus  abscess, referred for aspiration EXAM: IMAGE GUIDED ASPIRATION OF LEFT PELVIC ABSCESS MEDICATIONS: The patient is currently admitted to the hospital and receiving intravenous antibiotics. The antibiotics were administered within an appropriate time frame prior to the initiation of the procedure. ANESTHESIA/SEDATION: Fentanyl 1.0 mcg IV; Versed 50 mg IV Moderate Sedation Time:  12 minutes The patient was continuously monitored during the procedure by the interventional radiology nurse under my direct supervision. COMPLICATIONS: None  PROCEDURE: Informed written consent was obtained from the patient after a thorough discussion of the procedural risks, benefits and alternatives. All questions were addressed. Maximal Sterile Barrier Technique was utilized including caps, mask, sterile gowns, sterile gloves, sterile drape, hand hygiene and skin antiseptic. A timeout was performed prior to the initiation of the procedure. Patient positioned supine position on the CT gantry table. Scout CT was acquired for planning purposes. The patient was then prepped and draped in the usual sterile fashion. 1% lidocaine was used for local anesthesia. Using CT guidance, trocar needle was advanced from anterior inferior left pelvis to the fluid collection overlying the left sacroiliac joint. We confirmed needle tip placement with CT guidance, and then aspirated approximately 5-6 cc of murky bloody fluid. Sample sent for culture. Needle was removed and a sterile bandage was placed. Patient tolerated the procedure well and remained hemodynamically stable throughout. No complications were encountered and no significant blood loss. IMPRESSION: Status post CT-guided aspiration of left pelvic abscess. Signed, Dulcy Fanny. Dellia Nims, RPVI [facility] Electronically Signed   By: Corrie Mckusick D.O.   On: 12/16/2019 10:49   ECHOCARDIOGRAM COMPLETE  Result Date: 12/16/2019    ECHOCARDIOGRAM REPORT   Patient Name:   LYSANDER CALIXTE Date of Exam: 12/16/2019 Medical Rec #:  [id number]      Height:       70.0 in Accession #:    [id number]     Weight:       175.0 lb Date of Birth:  06-Nov-1990       BSA:          1.972 m Patient Age:    29 years       BP:           104/70 mmHg Patient Gender: M              HR:           80 bpm. Exam Location:  Inpatient Procedure: 2D Echo, Cardiac Doppler and Color Doppler Indications:    Bacteremia 790.7 / R78.81  History:        Patient has prior history of Echocardiogram examinations, most                  recent 11/24/2019. Polysubstance abuse.  Sonographer:    Tiffany Dance Referring Phys: [id number] RONDELL A SMITH IMPRESSIONS  1. Left ventricular ejection fraction, by estimation, is 55 to 60%. The left ventricle has normal function. The left ventricle has no regional wall motion abnormalities. Left ventricular diastolic parameters were normal.  2. Right ventricular systolic function is normal. The right ventricular size is normal. Tricuspid regurgitation signal is inadequate for assessing PA pressure.  3. The mitral valve is normal in structure. No evidence of mitral valve regurgitation.  4. The aortic valve is tricuspid. Aortic valve regurgitation is not visualized. No aortic stenosis is present.  5. The inferior vena cava is dilated in size with >50% respiratory variability, suggesting right atrial pressure of 8 mmHg. Conclusion(s)/Recommendation(s): No vegetation seen.  If high clinical suspicion for endocarditis, consider TEE. FINDINGS  Left Ventricle: Left ventricular ejection fraction, by estimation, is 55 to 60%. The left ventricle has normal function. The left ventricle has no regional wall motion abnormalities. The left ventricular internal cavity size was normal in size. There is  no left ventricular hypertrophy. Left ventricular diastolic parameters were normal. Right Ventricle: The right ventricular size is normal. Right vetricular wall thickness was not assessed. Right ventricular systolic function is normal. Tricuspid regurgitation signal is inadequate for assessing PA pressure. Left Atrium: Left atrial size was normal in size. Right Atrium: Right atrial size was normal in size. Pericardium: There is no evidence of pericardial effusion. Mitral Valve: The mitral valve is normal in structure. No evidence of mitral valve regurgitation. Tricuspid Valve: The tricuspid valve is normal in structure. Tricuspid valve regurgitation is trivial. Aortic Valve: The aortic valve is tricuspid. Aortic valve  regurgitation is not visualized. No aortic stenosis is present. Pulmonic Valve: The pulmonic valve was not well visualized. Pulmonic valve regurgitation is not visualized. Aorta: The aortic root and ascending aorta are structurally normal, with no evidence of dilitation. Venous: The inferior vena cava is dilated in size with greater than 50% respiratory variability, suggesting right atrial pressure of 8 mmHg. IAS/Shunts: The interatrial septum was not well visualized.  LEFT VENTRICLE PLAX 2D LVIDd:         5.10 cm  Diastology LVIDs:         3.30 cm  LV e' lateral:   14.80 cm/s LV PW:         1.00 cm  LV E/e' lateral: 6.4 LV IVS:        1.00 cm  LV e' medial:    11.00 cm/s LVOT diam:     2.20 cm  LV E/e' medial:  8.6 LV SV:         87 LV SV Index:   44 LVOT Area:     3.80 cm  RIGHT VENTRICLE             IVC RV Basal diam:  3.10 cm     IVC diam: 2.10 cm RV Mid diam:    2.90 cm RV S prime:     17.00 cm/s TAPSE (M-mode): 2.1 cm LEFT ATRIUM             Index       RIGHT ATRIUM           Index LA diam:        3.80 cm 1.93 cm/m  RA Area:     16.50 cm LA Vol (A2C):   64.7 ml 32.81 ml/m RA Volume:   44.80 ml  22.72 ml/m LA Vol (A4C):   64.3 ml 32.60 ml/m LA Biplane Vol: 64.1 ml 32.50 ml/m  AORTIC VALVE LVOT Vmax:   117.00 cm/s LVOT Vmean:  79.000 cm/s LVOT VTI:    0.229 m  AORTA Ao Root diam: 3.10 cm Ao Asc diam:  2.40 cm MITRAL VALVE MV Area (PHT): 3.37 cm    SHUNTS MV Decel Time: 225 msec    Systemic VTI:  0.23 m MV E velocity: 94.60 cm/s  Systemic Diam: 2.20 cm MV A velocity: 53.60 cm/s MV E/A ratio:  1.76 Oswaldo Milian MD Electronically signed by Oswaldo Milian MD Signature Date/Time: 12/16/2019/8:32:25 PM    Final         Scheduled Meds: . busPIRone  5 mg Oral BID  . cloNIDine  0.1 mg Oral QID  Followed by  . [START ON 12/18/2019] cloNIDine  0.1 mg Oral BH-qamhs   Followed by  . [START ON 12/20/2019] cloNIDine  0.1 mg Oral QAC breakfast  . enoxaparin (LOVENOX) injection  40 mg  Subcutaneous Q24H  . nicotine  21 mg Transdermal Daily  . sodium chloride flush  3 mL Intravenous Q12H   Continuous Infusions: . vancomycin 1,000 mg (12/17/19 1015)     LOS: 2 days    Time spent: 35 minutes  Domenic Polite, MD [facility]

## 2019-12-17 NOTE — Plan of Care (Signed)
  Problem: Education: Goal: Knowledge of General Education information will improve Description Including pain rating scale, medication(s)/side effects and non-pharmacologic comfort measures Outcome: Progressing   

## 2019-12-18 LAB — CULTURE, BLOOD (ROUTINE X 2)
Culture: NO GROWTH
Culture: NO GROWTH
Special Requests: ADEQUATE
Special Requests: ADEQUATE

## 2019-12-18 LAB — VANCOMYCIN, TROUGH: Vancomycin Tr: 9 ug/mL — ABNORMAL LOW (ref 15–20)

## 2019-12-18 MED ORDER — VANCOMYCIN HCL 1500 MG/300ML IV SOLN
1500.0000 mg | Freq: Three times a day (TID) | INTRAVENOUS | Status: DC
  Administered 2019-12-18 – 2019-12-23 (×14): 1500 mg via INTRAVENOUS
  Filled 2019-12-18 (×17): qty 300

## 2019-12-18 NOTE — Progress Notes (Signed)
Pharmacy Antibiotic Note  Jordan [name] is a 29 y.o. male admitted on 12/15/2019 with osteomyelitis of left sacroiliac joint.  Pharmacy has been consulted for vancomycin dosing.  Patient on maintenance dosing of vancomycin 1000mg  q8. Vancomycin trough 8/28 drawn 7 hours post dose was 9. This is below the goal of 15-20.   Plan: Increase vancomycin dose to 1500mg  q8    Goal vancomycin trough range:   15-20  mcg/mL Pharmacy will continue to monitor renal function, vancomycin troughs as clinically appropriate,  cultures and patient progress.   Height: 5\' 10"  (177.8 cm) Weight: 79.4 kg (175 lb 0.8 oz) IBW/kg (Calculated) : 73  Temp (24hrs), Avg:98.3 F (36.8 C), Min:97.9 F (36.6 C), Max:98.6 F (37 C)  Recent Labs  Lab 12/12/19 0300 12/12/19 0452 12/13/19 0611 12/15/19 1413 12/16/19 0500 12/18/19 0229  WBC 13.0*  --  11.8* 12.4* 11.9*  --   CREATININE 0.74  --  0.69 0.84 0.85  --   LATICACIDVEN 2.0* 1.0  --   --   --   --   VANCOTROUGH  --   --   --   --   --  9*    Estimated Creatinine Clearance: 133.6 mL/min (by C-G formula based on SCr of 0.85 mg/dL).    No Known Allergies  Antimicrobials this admission: vancomycin 8/13 >>      Microbiology results: 8/13 BCx:2:  8/13 Resp PCR: SARS CoV-2 negative   Thank you for allowing pharmacy to be a part of this patient's care.  Norina Buzzard, PharmD PGY1 Pharmacy Resident 12/18/2019 9:07 AM

## 2019-12-18 NOTE — Progress Notes (Signed)
[facility]    Date of Admission:  12/15/2019   Total days of antibiotics 5           ID: Jordan [name] is a 29 y.o. male with MRSA bacteremia with left iliacus abscess s/p drainage Principal Problem:   Acute osteomyelitis, pelvis ([place]) Active Problems:   Polysubstance abuse ([place])   Bacteremia   MRSA bacteremia   Drug overdose   Leukocytosis   Thrombocytosis ([facility])    Subjective: More comfortable today. Less left hip pain. Denies fever, nor any diarrhea  Medications:  . busPIRone  5 mg Oral BID  . cloNIDine  0.1 mg Oral BH-qamhs   Followed by  . [START ON 12/20/2019] cloNIDine  0.1 mg Oral QAC breakfast  . enoxaparin (LOVENOX) injection  40 mg Subcutaneous Q24H  . nicotine  21 mg Transdermal Daily  . sodium chloride flush  3 mL Intravenous Q12H    Objective: Vital signs in last 24 hours: Temp:  [97.9 F (36.6 C)-98.6 F (37 C)] 97.9 F (36.6 C) (08/19 0845) Pulse Rate:  [55-72] 72 (08/19 0845) Resp:  [12-18] 12 (08/19 0845) BP: (99-127)/(49-67) 127/66 (08/19 0845) SpO2:  [99 %-100 %] 100 % (08/19 0845) Weight:  [79.4 kg] 79.4 kg (08/18 2122)  Physical Exam  Constitutional: He is oriented to person, place, and time. He appears well-developed and well-nourished. No distress.  HENT:  Mouth/Throat: Oropharynx is clear and moist. No oropharyngeal exudate.  Cardiovascular: Normal rate, regular rhythm and normal heart sounds. Exam reveals no gallop and no friction rub.  No murmur heard.  Pulmonary/Chest: Effort normal and breath sounds normal. No respiratory distress. He has no wheezes.  Abdominal: Soft. Bowel sounds are normal. He exhibits no distension. There is no tenderness.  Lymphadenopathy:  He has no cervical adenopathy.  Neurological: He is alert and oriented to person, place, and time.  Skin: Skin is warm and dry. No rash noted. No erythema.  Psychiatric: He has a normal mood and affect. His behavior is normal.     Lab  Results Recent Labs    12/15/19 1413 12/16/19 0500  WBC 12.4* 11.9*  HGB 10.1* 9.6*  HCT 33.7* 31.8*  NA 140 137  K 3.6 4.1  CL 105 105  CO2 24 25  BUN 13 15  CREATININE 0.84 0.85   Liver Panel Recent Labs    12/15/19 1413  PROT 7.6  ALBUMIN 3.1*  AST 26  ALT 21  ALKPHOS 100  BILITOT 0.8    Microbiology: 8/13 blood cx- MRSA  8/14 blood cx - NGTD Studies/Results: ECHOCARDIOGRAM COMPLETE  Result Date: 12/16/2019    ECHOCARDIOGRAM REPORT   Patient Name:   Jordan [name] Date of Exam: 12/16/2019 Medical Rec #:  [id number]      Height:       70.0 in Accession #:    [id number]     Weight:       175.0 lb Date of Birth:  [birth year]       BSA:          1.972 m Patient Age:    29 years       BP:           104/70 mmHg Patient Gender: M              HR:           80 bpm. Exam Location:  Inpatient Procedure: 2D Echo, Cardiac Doppler and Color Doppler Indications:  Bacteremia 790.7 / R78.81  History:        Patient has prior history of Echocardiogram examinations, most                 recent 11/24/2019. Polysubstance abuse.  Sonographer:    Tiffany Dance Referring Phys: [id number] RONDELL A SMITH IMPRESSIONS  1. Left ventricular ejection fraction, by estimation, is 55 to 60%. The left ventricle has normal function. The left ventricle has no regional wall motion abnormalities. Left ventricular diastolic parameters were normal.  2. Right ventricular systolic function is normal. The right ventricular size is normal. Tricuspid regurgitation signal is inadequate for assessing PA pressure.  3. The mitral valve is normal in structure. No evidence of mitral valve regurgitation.  4. The aortic valve is tricuspid. Aortic valve regurgitation is not visualized. No aortic stenosis is present.  5. The inferior vena cava is dilated in size with >50% respiratory variability, suggesting right atrial pressure of 8 mmHg. Conclusion(s)/Recommendation(s): No vegetation seen. If high clinical suspicion for endocarditis,  consider TEE. FINDINGS  Left Ventricle: Left ventricular ejection fraction, by estimation, is 55 to 60%. The left ventricle has normal function. The left ventricle has no regional wall motion abnormalities. The left ventricular internal cavity size was normal in size. There is  no left ventricular hypertrophy. Left ventricular diastolic parameters were normal. Right Ventricle: The right ventricular size is normal. Right vetricular wall thickness was not assessed. Right ventricular systolic function is normal. Tricuspid regurgitation signal is inadequate for assessing PA pressure. Left Atrium: Left atrial size was normal in size. Right Atrium: Right atrial size was normal in size. Pericardium: There is no evidence of pericardial effusion. Mitral Valve: The mitral valve is normal in structure. No evidence of mitral valve regurgitation. Tricuspid Valve: The tricuspid valve is normal in structure. Tricuspid valve regurgitation is trivial. Aortic Valve: The aortic valve is tricuspid. Aortic valve regurgitation is not visualized. No aortic stenosis is present. Pulmonic Valve: The pulmonic valve was not well visualized. Pulmonic valve regurgitation is not visualized. Aorta: The aortic root and ascending aorta are structurally normal, with no evidence of dilitation. Venous: The inferior vena cava is dilated in size with greater than 50% respiratory variability, suggesting right atrial pressure of 8 mmHg. IAS/Shunts: The interatrial septum was not well visualized.  LEFT VENTRICLE PLAX 2D LVIDd:         5.10 cm  Diastology LVIDs:         3.30 cm  LV e' lateral:   14.80 cm/s LV PW:         1.00 cm  LV E/e' lateral: 6.4 LV IVS:        1.00 cm  LV e' medial:    11.00 cm/s LVOT diam:     2.20 cm  LV E/e' medial:  8.6 LV SV:         87 LV SV Index:   44 LVOT Area:     3.80 cm  RIGHT VENTRICLE             IVC RV Basal diam:  3.10 cm     IVC diam: 2.10 cm RV Mid diam:    2.90 cm RV S prime:     17.00 cm/s TAPSE (M-mode): 2.1 cm  LEFT ATRIUM             Index       RIGHT ATRIUM           Index LA diam:  3.80 cm 1.93 cm/m  RA Area:     16.50 cm LA Vol (A2C):   64.7 ml 32.81 ml/m RA Volume:   44.80 ml  22.72 ml/m LA Vol (A4C):   64.3 ml 32.60 ml/m LA Biplane Vol: 64.1 ml 32.50 ml/m  AORTIC VALVE LVOT Vmax:   117.00 cm/s LVOT Vmean:  79.000 cm/s LVOT VTI:    0.229 m  AORTA Ao Root diam: 3.10 cm Ao Asc diam:  2.40 cm MITRAL VALVE MV Area (PHT): 3.37 cm    SHUNTS MV Decel Time: 225 msec    Systemic VTI:  0.23 m MV E velocity: 94.60 cm/s  Systemic Diam: 2.20 cm MV A velocity: 53.60 cm/s MV E/A ratio:  1.76 Oswaldo Milian MD Electronically signed by Oswaldo Milian MD Signature Date/Time: 12/16/2019/8:32:25 PM    Final      Assessment/Plan: Herold Harms bacteremia = dose adjusted his vancomycin since subtherapeutic. Blood cx cleared  Depression = continue on buspar plus clonidine  Opiate withdrawal = continue on clonidine  [facility] Cell: [phone] Pager: [phone]  12/18/2019, 10:36 AM

## 2019-12-18 NOTE — Plan of Care (Signed)
  Problem: Activity: Goal: Risk for activity intolerance will decrease Outcome: Progressing   Problem: Pain Managment: Goal: General experience of comfort will improve Outcome: Progressing   

## 2019-12-18 NOTE — Progress Notes (Signed)
PROGRESS NOTE    Jordan [name]  LGX:[id number] DOB: [birth year] DOA: 12/15/2019 PCP: Patient, No Pcp Per    Brief Narrative:  29 year old male with IV drug use, hospitalized on 7/19-7/27 for MRSA bacteremia, TEE negative, treated with vancomycin and then discharged on Zyvox.  Readmitted with fever and worsening buttock pain on 8/13 growing MRSA bacteremia and left AMA.  MRI at that time was consistent with osteomyelitis of the left SI joint with underlying collection.  He was found passed out in front of [facility] on the ground and unresponsive.  Was using fentanyl prior to that.  He was given Narcan and brought to the ER.  Assessment & Plan:  MRSA bacteremia/IV drug abuse Left SI joint septic arthritis/osteomyelitis, iliopsoas abscess -Blood cultures from recent hospitalization with MRSA, aspiration from left iliac muscle abscess growing MRSA as well -Transthoracic echo unremarkable, TEE on 7/26 was negative -Repeat blood cultures negative x3days -Would not be a good candidate for discharge with PICC line given long history of IV heroin abuse -ID following  IV drug use with high risk of opiate withdrawals: Uses large amounts of fentanyl, cocaine, methamphetamine, benzodiazepine. -Patient was started on opiate withdrawal protocol with clonidine, baclofen, NSAIDs.   -Continue current dose of oral opiates -Will discuss Suboxone at discharge -Counseled  DVT prophylaxis: enoxaparin (LOVENOX) injection 40 mg Start: 12/15/19 2200  Code Status: Full code Family Communication: None Disposition Plan: Status is: Inpatient  Remains inpatient appropriate because: IV drug abuse needing long-term IV antibiotics  Dispo: The patient is from: Home              Anticipated d/c is to: Home              Anticipated d/c date is: > 3 days              Patient currently is not medically stable to d/c.  Consultants:   Infectious disease  Interventional radiology  Procedures:   Aspiration of the SI  joint in IR  Antimicrobials:  Antibiotics Given (last 72 hours)    Date/Time Action Medication Dose Rate   12/15/19 1647 New Bag/Given   vancomycin (VANCOREADY) IVPB 1500 mg/300 mL 1,500 mg 150 mL/hr   12/16/19 0248 New Bag/Given   vancomycin (VANCOCIN) IVPB 1000 mg/200 mL premix 1,000 mg 200 mL/hr   12/16/19 1136 New Bag/Given   vancomycin (VANCOCIN) IVPB 1000 mg/200 mL premix 1,000 mg 200 mL/hr   12/16/19 1916 New Bag/Given   vancomycin (VANCOCIN) IVPB 1000 mg/200 mL premix 1,000 mg 200 mL/hr   12/17/19 0314 New Bag/Given   vancomycin (VANCOCIN) IVPB 1000 mg/200 mL premix 1,000 mg 200 mL/hr   12/17/19 1015 New Bag/Given   vancomycin (VANCOCIN) IVPB 1000 mg/200 mL premix 1,000 mg 200 mL/hr   12/17/19 1917 New Bag/Given   vancomycin (VANCOCIN) IVPB 1000 mg/200 mL premix 1,000 mg 200 mL/hr   12/18/19 0338 New Bag/Given   vancomycin (VANCOCIN) IVPB 1000 mg/200 mL premix 1,000 mg 200 mL/hr   12/18/19 1040 New Bag/Given   vancomycin (VANCOREADY) IVPB 1500 mg/300 mL 1,500 mg 150 mL/hr         Subjective: Feels ok, mild hip/back discomfort  Objective: Vitals:   12/17/19 2122 12/18/19 0516 12/18/19 0558 12/18/19 0845  BP: 118/64 (!) 99/49 104/67 127/66  Pulse: 70 (!) 55 (!) 57 72  Resp: 18 18  12   Temp: 98.5 F (36.9 C) 98.4 F (36.9 C)  97.9 F (36.6 C)  TempSrc: Oral   Oral  SpO2: 99%  100%  100%  Weight: 79.4 kg     Height:        Intake/Output Summary (Last 24 hours) at 12/18/2019 1444 Last data filed at 12/18/2019 1300 Gross per 24 hour  Intake 2117 ml  Output 600 ml  Net 1517 ml   Filed Weights   12/15/19 1341 12/16/19 1922 12/17/19 2122  Weight: 79.4 kg 79.4 kg 79.4 kg    Examination:  General exam: AAOx3, resting in bed, no distress HEENT: no JVD CVS: S1S2/RRR Lungs: CTAB Abd: soft, NT, BS present Ext: no edema Neuro: Moves all extremities: No localizing signs   Data Reviewed: I have personally reviewed following labs and imaging  studies  CBC: Recent Labs  Lab 12/12/19 0300 12/13/19 0611 12/15/19 1413 12/16/19 0500  WBC 13.0* 11.8* 12.4* 11.9*  NEUTROABS 8.3*  --   --   --   HGB 10.5* 10.6* 10.1* 9.6*  HCT 33.2* 34.3* 33.7* 31.8*  MCV 81.6 82.3 83.6 83.5  PLT 390 404* 468* 465*   Basic Metabolic Panel: Recent Labs  Lab 12/12/19 0300 12/13/19 0611 12/15/19 1413 12/16/19 0500  NA 138 141 140 137  K 3.5 3.7 3.6 4.1  CL 107 107 105 105  CO2 24 24 24 25   GLUCOSE 107* 101* 103* 91  BUN 8 9 13 15   CREATININE 0.74 0.69 0.84 0.85  CALCIUM 8.5* 8.9 9.2 8.8*   GFR: Estimated Creatinine Clearance: 133.6 mL/min (by C-G formula based on SCr of 0.85 mg/dL). Liver Function Tests: Recent Labs  Lab 12/12/19 0300 12/15/19 1413  AST 18 26  ALT 13 21  ALKPHOS 104 100  BILITOT 0.1* 0.8  PROT 8.2* 7.6  ALBUMIN 3.1* 3.1*   No results for input(s): LIPASE, AMYLASE in the last 168 hours. No results for input(s): AMMONIA in the last 168 hours. Coagulation Profile: No results for input(s): INR, PROTIME in the last 168 hours. Cardiac Enzymes: No results for input(s): CKTOTAL, CKMB, CKMBINDEX, TROPONINI in the last 168 hours. BNP (last 3 results) No results for input(s): PROBNP in the last 8760 hours. HbA1C: No results for input(s): HGBA1C in the last 72 hours. CBG: No results for input(s): GLUCAP in the last 168 hours. Lipid Profile: No results for input(s): CHOL, HDL, LDLCALC, TRIG, CHOLHDL, LDLDIRECT in the last 72 hours. Thyroid Function Tests: No results for input(s): TSH, T4TOTAL, FREET4, T3FREE, THYROIDAB in the last 72 hours. Anemia Panel: No results for input(s): VITAMINB12, FOLATE, FERRITIN, TIBC, IRON, RETICCTPCT in the last 72 hours. Sepsis Labs: Recent Labs  Lab 12/12/19 0300 12/12/19 0452  LATICACIDVEN 2.0* 1.0    Recent Results (from the past 240 hour(s))  SARS Coronavirus 2 by RT PCR (hospital order, performed in [facility] hospital lab) Nasopharyngeal Nasopharyngeal Swab      Status: None   Collection Time: 12/12/19  3:00 AM   Specimen: Nasopharyngeal Swab  Result Value Ref Range Status   SARS Coronavirus 2 NEGATIVE NEGATIVE Final    Comment: (NOTE) SARS-CoV-2 target nucleic acids are NOT DETECTED.  The SARS-CoV-2 RNA is generally detectable in upper and lower respiratory specimens during the acute phase of infection. The lowest concentration of SARS-CoV-2 viral copies this assay can detect is 250 copies / mL. A negative result does not preclude SARS-CoV-2 infection and should not be used as the sole basis for treatment or other patient management decisions.  A negative result may occur with improper specimen collection / handling, submission of specimen other than nasopharyngeal swab, presence of viral mutation(s) within  the areas targeted by this assay, and inadequate number of viral copies (<250 copies / mL). A negative result must be combined with clinical observations, patient history, and epidemiological information.  Fact Sheet for Patients:   StrictlyIdeas.no  Fact Sheet for Healthcare Providers: BankingDealers.co.za  This test is not yet approved or  cleared by the [place] FDA and has been authorized for detection and/or diagnosis of SARS-CoV-2 by FDA under an Emergency Use Authorization (EUA).  This EUA will remain in effect (meaning this test can be used) for the duration of the COVID-19 declaration under Section 564(b)(1) of the Act, 21 U.S.C. section 360bbb-3(b)(1), unless the authorization is terminated or revoked sooner.  Performed at [facility], [address], [place], [place] [address]   Culture, blood (single)     Status: Abnormal   Collection Time: 12/12/19  4:52 AM   Specimen: BLOOD  Result Value Ref Range Status   Specimen Description   Final    BLOOD RIGHT ANTECUBITAL Performed at [facility], [address] [place], [place] [address]    Special Requests   Final     BOTTLES DRAWN AEROBIC AND ANAEROBIC Blood Culture adequate volume Performed at [facility], [address] [place], [place] [address]    Culture  Setup Time   Final    GRAM POSITIVE COCCI Gram Stain Report Called to,Read Back By and Verified With: MANNS,J @ 0037 ON 12/13/19 BY JUW AEROBIC BOTTLE ONLY GS DONE @ [facility] Organism ID to follow CRITICAL RESULT CALLED TO, READ BACK BY AND VERIFIED WITH: Melina Schools RN 12/13/19 0600 JDW Performed at [facility], [place] [address], [place], [place] [address]    Culture METHICILLIN RESISTANT STAPHYLOCOCCUS AUREUS (A)  Final   Report Status 12/15/2019 FINAL  Final   Organism ID, Bacteria METHICILLIN RESISTANT STAPHYLOCOCCUS AUREUS  Final      Susceptibility   Methicillin resistant staphylococcus aureus - MIC*    CIPROFLOXACIN >=8 RESISTANT Resistant     ERYTHROMYCIN >=8 RESISTANT Resistant     GENTAMICIN <=0.5 SENSITIVE Sensitive     OXACILLIN >=4 RESISTANT Resistant     TETRACYCLINE <=1 SENSITIVE Sensitive     VANCOMYCIN <=0.5 SENSITIVE Sensitive     TRIMETH/SULFA 160 RESISTANT Resistant     CLINDAMYCIN <=0.25 SENSITIVE Sensitive     RIFAMPIN <=0.5 SENSITIVE Sensitive     Inducible Clindamycin NEGATIVE Sensitive     * METHICILLIN RESISTANT STAPHYLOCOCCUS AUREUS  Blood Culture ID Panel (Reflexed)     Status: Abnormal   Collection Time: 12/12/19  4:52 AM  Result Value Ref Range Status   Enterococcus faecalis NOT DETECTED NOT DETECTED Final   Enterococcus Faecium NOT DETECTED NOT DETECTED Final   Listeria monocytogenes NOT DETECTED NOT DETECTED Final   Staphylococcus species DETECTED (A) NOT DETECTED Final    Comment: CRITICAL RESULT CALLED TO, READ BACK BY AND VERIFIED WITH: J MANNS RN 12/13/19 0600 JDW    Staphylococcus aureus (BCID) DETECTED (A) NOT DETECTED Final    Comment: Methicillin (oxacillin)-resistant Staphylococcus aureus (MRSA). MRSA is predictably resistant to beta-lactam antibiotics (except ceftaroline). Preferred therapy is  vancomycin unless clinically contraindicated. Patient requires contact precautions if  hospitalized. CRITICAL RESULT CALLED TO, READ BACK BY AND VERIFIED WITH: J MANNS RN 12/13/19 0600 JDW    Staphylococcus epidermidis NOT DETECTED NOT DETECTED Final   Staphylococcus lugdunensis NOT DETECTED NOT DETECTED Final   Streptococcus species NOT DETECTED NOT DETECTED Final   Streptococcus agalactiae NOT DETECTED NOT DETECTED Final   Streptococcus  pneumoniae NOT DETECTED NOT DETECTED Final   Streptococcus pyogenes NOT DETECTED NOT DETECTED Final   A.calcoaceticus-baumannii NOT DETECTED NOT DETECTED Final   Bacteroides fragilis NOT DETECTED NOT DETECTED Final   Enterobacterales NOT DETECTED NOT DETECTED Final   Enterobacter cloacae complex NOT DETECTED NOT DETECTED Final   Escherichia coli NOT DETECTED NOT DETECTED Final   Klebsiella aerogenes NOT DETECTED NOT DETECTED Final   Klebsiella oxytoca NOT DETECTED NOT DETECTED Final   Klebsiella pneumoniae NOT DETECTED NOT DETECTED Final   Proteus species NOT DETECTED NOT DETECTED Final   Salmonella species NOT DETECTED NOT DETECTED Final   Serratia marcescens NOT DETECTED NOT DETECTED Final   Haemophilus influenzae NOT DETECTED NOT DETECTED Final   Neisseria meningitidis NOT DETECTED NOT DETECTED Final   Pseudomonas aeruginosa NOT DETECTED NOT DETECTED Final   Stenotrophomonas maltophilia NOT DETECTED NOT DETECTED Final   Candida albicans NOT DETECTED NOT DETECTED Final   Candida auris NOT DETECTED NOT DETECTED Final   Candida glabrata NOT DETECTED NOT DETECTED Final   Candida krusei NOT DETECTED NOT DETECTED Final   Candida parapsilosis NOT DETECTED NOT DETECTED Final   Candida tropicalis NOT DETECTED NOT DETECTED Final   Cryptococcus neoformans/gattii NOT DETECTED NOT DETECTED Final   Meth resistant mecA/C and MREJ DETECTED (A) NOT DETECTED Final    Comment: CRITICAL RESULT CALLED TO, READ BACK BY AND VERIFIED WITH: J MANNS RN 12/13/19 0600  JDW Performed at [facility], [address], [place], [place] [address]   Culture, blood (single) w Reflex to ID Panel     Status: Abnormal   Collection Time: 12/12/19 10:40 AM   Specimen: BLOOD  Result Value Ref Range Status   Specimen Description   Final    BLOOD RIGHT HAND Performed at [facility], [address], [place], [place] [address]    Special Requests   Final    BOTTLES DRAWN AEROBIC AND ANAEROBIC Blood Culture adequate volume Performed at [facility], [address] [place], [place] [address]    Culture  Setup Time   Final    GRAM POSITIVE COCCI Gram Stain Report Called to,Read Back By and Verified With: BULLINS L. @ 1010 ON [id number] BY HENDERSON L CRITICAL VALUE NOTED.  VALUE IS CONSISTENT WITH PREVIOUSLY REPORTED AND CALLED VALUE. Gram Stain Report Called to,Read Back By and Verified With: PREVIOUSLY CALLED ON 12/13/2019@1010BY  LH  KAY IN BOTH AEROBIC AND ANAEROBIC BOTTLES    Culture (A)  Final    STAPHYLOCOCCUS AUREUS SUSCEPTIBILITIES PERFORMED ON PREVIOUS CULTURE WITHIN THE LAST 5 DAYS. Performed at [facility], [place] [address], [place], [place] [address]    Report Status 12/15/2019 FINAL  Final  Culture, blood (Routine X 2) w Reflex to ID Panel     Status: None   Collection Time: 12/13/19  5:35 PM   Specimen: Right Antecubital; Blood  Result Value Ref Range Status   Specimen Description RIGHT ANTECUBITAL  Final   Special Requests   Final    BOTTLES DRAWN AEROBIC AND ANAEROBIC Blood Culture adequate volume   Culture   Final    NO GROWTH 5 DAYS Performed at [facility], [address], [place], [place] [address]    Report Status 12/18/2019 FINAL  Final  Culture, blood (Routine X 2) w Reflex to ID Panel     Status: None   Collection Time: 12/13/19  5:35 PM   Specimen: BLOOD RIGHT HAND  Result Value Ref Range Status   Specimen Description BLOOD  RIGHT HAND  Final   Special Requests   Final    BOTTLES DRAWN AEROBIC AND ANAEROBIC Blood Culture  adequate volume   Culture   Final    NO GROWTH 5 DAYS Performed at [facility], [address] [place], [place] [address]    Report Status 12/18/2019 FINAL  Final  Blood culture (routine x 2)     Status: None (Preliminary result)   Collection Time: 12/15/19  3:53 PM   Specimen: BLOOD  Result Value Ref Range Status   Specimen Description BLOOD LEFT UPPER ARM  Final   Special Requests   Final    BOTTLES DRAWN AEROBIC AND ANAEROBIC Blood Culture adequate volume   Culture   Final    NO GROWTH 3 DAYS Performed at [facility] [address] [place], [place] [address]    Report Status PENDING  Incomplete  Blood culture (routine x 2)     Status: None (Preliminary result)   Collection Time: 12/15/19  4:35 PM   Specimen: BLOOD LEFT HAND  Result Value Ref Range Status   Specimen Description BLOOD LEFT HAND  Final   Special Requests   Final    BOTTLES DRAWN AEROBIC AND ANAEROBIC Blood Culture adequate volume   Culture   Final    NO GROWTH 3 DAYS Performed at [facility], [place] [address] [place], [place] [address]    Report Status PENDING  Incomplete  SARS Coronavirus 2 by RT PCR (hospital order, performed in [facility] hospital lab) Nasopharyngeal Nasopharyngeal Swab     Status: None   Collection Time: 12/15/19  4:46 PM   Specimen: Nasopharyngeal Swab  Result Value Ref Range Status   SARS Coronavirus 2 NEGATIVE NEGATIVE Final    Comment: (NOTE) SARS-CoV-2 target nucleic acids are NOT DETECTED.  The SARS-CoV-2 RNA is generally detectable in upper and lower respiratory specimens during the acute phase of infection. The lowest concentration of SARS-CoV-2 viral copies this assay can detect is 250 copies / mL. A negative result does not preclude SARS-CoV-2 infection and should not be used as the sole basis for treatment or other patient management decisions.  A negative result may occur with improper specimen collection / handling, submission of specimen other than  nasopharyngeal swab, presence of viral mutation(s) within the areas targeted by this assay, and inadequate number of viral copies (<250 copies / mL). A negative result must be combined with clinical observations, patient history, and epidemiological information.  Fact Sheet for Patients:   StrictlyIdeas.no  Fact Sheet for Healthcare Providers: BankingDealers.co.za  This test is not yet approved or  cleared by the [place] FDA and has been authorized for detection and/or diagnosis of SARS-CoV-2 by FDA under an Emergency Use Authorization (EUA).  This EUA will remain in effect (meaning this test can be used) for the duration of the COVID-19 declaration under Section 564(b)(1) of the Act, 21 U.S.C. section 360bbb-3(b)(1), unless the authorization is terminated or revoked sooner.  Performed at [facility], [place] [address] [place],  [address]   Aerobic/Anaerobic Culture (surgical/deep wound)     Status: None (Preliminary result)   Collection Time: 12/16/19  9:02 AM   Specimen: Wound; Abscess  Result Value Ref Range Status   Specimen Description WOUND  Final   Special Requests LEFT ILIACUS MUSCLE ABCESS  Final   Gram Stain   Final    ABUNDANT WBC PRESENT, PREDOMINANTLY PMN FEW GRAM POSITIVE COCCI IN PAIRS Performed at [facility], [place] [address] [place], [place] [address]    Culture   Final    MODERATE METHICILLIN RESISTANT STAPHYLOCOCCUS AUREUS   Report Status PENDING  Incomplete   Organism ID, Bacteria METHICILLIN RESISTANT STAPHYLOCOCCUS AUREUS  Final      Susceptibility   Methicillin resistant staphylococcus aureus - MIC*    CIPROFLOXACIN >=8 RESISTANT Resistant     ERYTHROMYCIN >=8 RESISTANT Resistant     GENTAMICIN <=0.5 SENSITIVE Sensitive     OXACILLIN >=4 RESISTANT Resistant     TETRACYCLINE <=1 SENSITIVE Sensitive     VANCOMYCIN <=0.5 SENSITIVE Sensitive     TRIMETH/SULFA >=320 RESISTANT  Resistant     CLINDAMYCIN >=8 RESISTANT Resistant     RIFAMPIN <=0.5 SENSITIVE Sensitive     Inducible Clindamycin NEGATIVE Sensitive     * MODERATE METHICILLIN RESISTANT STAPHYLOCOCCUS AUREUS     Scheduled Meds: . busPIRone  5 mg Oral BID  . cloNIDine  0.1 mg Oral BH-qamhs   Followed by  . [START ON 12/20/2019] cloNIDine  0.1 mg Oral QAC breakfast  . enoxaparin (LOVENOX) injection  40 mg Subcutaneous Q24H  . nicotine  21 mg Transdermal Daily  . sodium chloride flush  3 mL Intravenous Q12H   Continuous Infusions: . vancomycin 1,500 mg (12/18/19 1040)     LOS: 3 days    Time spent: 25 minutes  Domenic Polite, MD [facility]

## 2019-12-19 LAB — BASIC METABOLIC PANEL
Anion gap: 8 (ref 5–15)
BUN: 16 mg/dL (ref 6–20)
CO2: 26 mmol/L (ref 22–32)
Calcium: 9 mg/dL (ref 8.9–10.3)
Chloride: 105 mmol/L (ref 98–111)
Creatinine, Ser: 0.76 mg/dL (ref 0.61–1.24)
GFR calc Af Amer: >60 mL/min (ref >60)
GFR calc non Af Amer: >60 mL/min (ref >60)
Glucose, Bld: 82 mg/dL (ref 70–99)
Potassium: 4 mmol/L (ref 3.5–5.1)
Sodium: 139 mmol/L (ref 135–145)

## 2019-12-19 LAB — CBC
HCT: 33.6 % — ABNORMAL LOW (ref 39.0–52.0)
Hemoglobin: 10.4 g/dL — ABNORMAL LOW (ref 13.0–17.0)
MCH: 25.7 pg — ABNORMAL LOW (ref 26.0–34.0)
MCHC: 31 g/dL (ref 30.0–36.0)
MCV: 83.2 fL (ref 80.0–100.0)
Platelets: 489 10*3/uL — ABNORMAL HIGH (ref 150–400)
RBC: 4.04 MIL/uL — ABNORMAL LOW (ref 4.22–5.81)
RDW: 14.8 % (ref 11.5–15.5)
WBC: 12 10*3/uL — ABNORMAL HIGH (ref 4.0–10.5)
nRBC: 0 % (ref 0.0–0.2)

## 2019-12-19 NOTE — Progress Notes (Signed)
    [facility] for Infectious Disease    Date of Admission:  12/15/2019   Total days of antibiotics 5           ID: Jordan [name] is a 29 y.o. male with  Principal Problem:   Acute osteomyelitis, pelvis ([place]) Active Problems:   Polysubstance abuse ([place])   Bacteremia   MRSA bacteremia   Drug overdose   Leukocytosis   Thrombocytosis ([facility])    Subjective: Afebrile. Feeling that back pain is improving  Medications:  . busPIRone  5 mg Oral BID  . cloNIDine  0.1 mg Oral BH-qamhs   Followed by  . [START ON 12/20/2019] cloNIDine  0.1 mg Oral QAC breakfast  . enoxaparin (LOVENOX) injection  40 mg Subcutaneous Q24H  . nicotine  21 mg Transdermal Daily  . sodium chloride flush  3 mL Intravenous Q12H    Objective: Vital signs in last 24 hours: Temp:  [97.9 F (36.6 C)-99.1 F (37.3 C)] 98.2 F (36.8 C) (08/20 1000) Pulse Rate:  [70-76] 74 (08/20 1000) Resp:  [18] 18 (08/20 1000) BP: (124-132)/(57-89) 124/57 (08/20 1000) SpO2:  [100 %] 100 % (08/20 1000) Physical Exam  Constitutional: He is oriented to person, place, and time. He appears well-developed and well-nourished. No distress.  HENT:  Mouth/Throat: Oropharynx is clear and moist. No oropharyngeal exudate.  Cardiovascular: Normal rate, regular rhythm and normal heart sounds. Exam reveals no gallop and no friction rub.  No murmur heard.  Pulmonary/Chest: Effort normal and breath sounds normal. No respiratory distress. He has no wheezes.  Abdominal: Soft. Bowel sounds are normal. He exhibits no distension. There is no tenderness.  Lymphadenopathy:  He has no cervical adenopathy.  Neurological: He is alert and oriented to person, place, and time.  Skin: Skin is warm and dry. No rash noted. No erythema.  Psychiatric: He has a normal mood and affect. His behavior is normal.    Lab Results Recent Labs    12/19/19 0704  WBC 12.0*  HGB 10.4*  HCT 33.6*  NA 139  K 4.0  CL 105  CO2 26  BUN 16  CREATININE 0.76     Microbiology:  Methicillin resistant staphylococcus aureus    MIC    CIPROFLOXACIN >=8 RESISTANT  Resistant    CLINDAMYCIN >=8 RESISTANT  Resistant    ERYTHROMYCIN >=8 RESISTANT  Resistant    GENTAMICIN <=0.5 SENSI... Sensitive    Inducible Clindamycin NEGATIVE  Sensitive    OXACILLIN >=4 RESISTANT  Resistant    RIFAMPIN <=0.5 SENSI... Sensitive    TETRACYCLINE <=1 SENSITIVE  Sensitive    TRIMETH/SULFA >=320 RESIS... Resistant    VANCOMYCIN <=0.5 SENSI... Sensitive     Studies/Results: No results found.   Assessment/Plan: MRSA  iliacus muscle abscess with complicated bacteremia = will plan to treat for 2 wk with IV vancomycin through 8/31 then plan to give dose of oritavancin plus orals. Had TTE did not show vegetation and recent TEE on 7/24 that did not show vegetation  Hip pain = suspect it will improve, consider lidocaine patch  Opiate dependence = continue on buspar and clonidine  Will see back on [facility] for Infectious Diseases Cell: [phone] Pager: [phone]  12/19/2019, 4:24 PM

## 2019-12-19 NOTE — Progress Notes (Signed)
PROGRESS NOTE    Jasraj Lappe  VEL:[id number] DOB: 11/10/1990 DOA: 12/15/2019 PCP: Patient, No Pcp Per    Brief Narrative:  29 year old male with IV drug use, hospitalized on 7/19-7/27 for MRSA bacteremia, TEE negative, treated with vancomycin and then discharged on Zyvox.  Readmitted with fever and worsening buttock pain on 8/13 growing MRSA bacteremia and left AMA.  MRI at that time was consistent with osteomyelitis of the left SI joint with underlying collection.  He was found passed out in front of [facility] on the ground and unresponsive.  Was using fentanyl prior to that.  He was given Narcan and brought to the ER.  Assessment & Plan:  MRSA bacteremia/IV drug abuse Left SI joint septic arthritis/osteomyelitis, iliopsoas abscess -Blood cultures from recent hospitalization with MRSA, aspiration from left iliac muscle abscess growing MRSA as well -Transthoracic echo unremarkable, TEE on 7/26 was negative -Repeat blood cultures negative x4days -Would not be a good candidate for discharge with PICC line given long history of IV heroin abuse -ID following, discussed with Dr. Baxter Flattery recommends 2-4 weeks of IV antibiotics inpatient followed by outpatient oritavancin  IV drug use with high risk of opiate withdrawals: Uses large amounts of fentanyl, cocaine, methamphetamine, benzodiazepine. -Patient was started on opiate withdrawal protocol with clonidine, baclofen, NSAIDs.   -Continue current dose of oral opiates -Will discuss Suboxone at discharge -Counseled  DVT prophylaxis: enoxaparin (LOVENOX) injection 40 mg Start: 12/15/19 2200  Code Status: Full code Family Communication: None Disposition Plan: Status is: Inpatient  Remains inpatient appropriate because: IV drug abuse needing long-term IV antibiotics  Dispo: The patient is from: Home              Anticipated d/c is to: Home              Anticipated d/c date is: > 3 days              Patient currently is not medically stable to  d/c.  Consultants:   Infectious disease  Interventional radiology  Procedures:   Aspiration of the SI joint in IR  Antimicrobials:  Antibiotics Given (last 72 hours)    Date/Time Action Medication Dose Rate   12/16/19 1916 New Bag/Given   vancomycin (VANCOCIN) IVPB 1000 mg/200 mL premix 1,000 mg 200 mL/hr   12/17/19 0314 New Bag/Given   vancomycin (VANCOCIN) IVPB 1000 mg/200 mL premix 1,000 mg 200 mL/hr   12/17/19 1015 New Bag/Given   vancomycin (VANCOCIN) IVPB 1000 mg/200 mL premix 1,000 mg 200 mL/hr   12/17/19 1917 New Bag/Given   vancomycin (VANCOCIN) IVPB 1000 mg/200 mL premix 1,000 mg 200 mL/hr   12/18/19 0338 New Bag/Given   vancomycin (VANCOCIN) IVPB 1000 mg/200 mL premix 1,000 mg 200 mL/hr   12/18/19 1040 New Bag/Given   vancomycin (VANCOREADY) IVPB 1500 mg/300 mL 1,500 mg 150 mL/hr   12/18/19 1827 New Bag/Given   vancomycin (VANCOREADY) IVPB 1500 mg/300 mL 1,500 mg 150 mL/hr   12/19/19 0258 New Bag/Given   vancomycin (VANCOREADY) IVPB 1500 mg/300 mL 1,500 mg 150 mL/hr   12/19/19 1140 New Bag/Given   vancomycin (VANCOREADY) IVPB 1500 mg/300 mL 1,500 mg 150 mL/hr         Subjective: -Feels okay overall, continues to have discomfort in his left hip/lower back  Objective: Vitals:   12/18/19 1625 12/18/19 2101 12/19/19 0509 12/19/19 1000  BP: 127/64 132/89 127/65 (!) 124/57  Pulse: 70 76 71 74  Resp: 18 18 18 18   Temp: 97.9 F (36.6 C)  99.1 F (37.3 C) 98.5 F (36.9 C) 98.2 F (36.8 C)  TempSrc: Oral Oral Oral Oral  SpO2: 100% 100% 100% 100%  Weight:      Height:        Intake/Output Summary (Last 24 hours) at 12/19/2019 1456 Last data filed at 12/19/2019 0900 Gross per 24 hour  Intake 2100 ml  Output 600 ml  Net 1500 ml   Filed Weights   12/15/19 1341 12/16/19 1922 12/17/19 2122  Weight: 79.4 kg 79.4 kg 79.4 kg    Examination:  General exam: Pleasant male sitting up in bed, AAOx3, no distress HEENT: No JVD CVS: S1-S2, regular rate  rhythm Lungs: Clear bilaterally Abdomen: Soft, nontender, bowel sounds present Extremities: No edema Neuro: Moves all extremities: No localizing signs   Data Reviewed: I have personally reviewed following labs and imaging studies  CBC: Recent Labs  Lab 12/13/19 0611 12/15/19 1413 12/16/19 0500 12/19/19 0704  WBC 11.8* 12.4* 11.9* 12.0*  HGB 10.6* 10.1* 9.6* 10.4*  HCT 34.3* 33.7* 31.8* 33.6*  MCV 82.3 83.6 83.5 83.2  PLT 404* 468* 406* 884*   Basic Metabolic Panel: Recent Labs  Lab 12/13/19 0611 12/15/19 1413 12/16/19 0500 12/19/19 0704  NA 141 140 137 139  K 3.7 3.6 4.1 4.0  CL 107 105 105 105  CO2 24 24 25 26   GLUCOSE 101* 103* 91 82  BUN 9 13 15 16   CREATININE 0.69 0.84 0.85 0.76  CALCIUM 8.9 9.2 8.8* 9.0   GFR: Estimated Creatinine Clearance: 141.9 mL/min (by C-G formula based on SCr of 0.76 mg/dL). Liver Function Tests: Recent Labs  Lab 12/15/19 1413  AST 26  ALT 21  ALKPHOS 100  BILITOT 0.8  PROT 7.6  ALBUMIN 3.1*   No results for input(s): LIPASE, AMYLASE in the last 168 hours. No results for input(s): AMMONIA in the last 168 hours. Coagulation Profile: No results for input(s): INR, PROTIME in the last 168 hours. Cardiac Enzymes: No results for input(s): CKTOTAL, CKMB, CKMBINDEX, TROPONINI in the last 168 hours. BNP (last 3 results) No results for input(s): PROBNP in the last 8760 hours. HbA1C: No results for input(s): HGBA1C in the last 72 hours. CBG: No results for input(s): GLUCAP in the last 168 hours. Lipid Profile: No results for input(s): CHOL, HDL, LDLCALC, TRIG, CHOLHDL, LDLDIRECT in the last 72 hours. Thyroid Function Tests: No results for input(s): TSH, T4TOTAL, FREET4, T3FREE, THYROIDAB in the last 72 hours. Anemia Panel: No results for input(s): VITAMINB12, FOLATE, FERRITIN, TIBC, IRON, RETICCTPCT in the last 72 hours. Sepsis Labs: No results for input(s): PROCALCITON, LATICACIDVEN in the last 168 hours.  Recent Results (from  the past 240 hour(s))  SARS Coronavirus 2 by RT PCR (hospital order, performed in [facility] hospital lab) Nasopharyngeal Nasopharyngeal Swab     Status: None   Collection Time: 12/12/19  3:00 AM   Specimen: Nasopharyngeal Swab  Result Value Ref Range Status   SARS Coronavirus 2 NEGATIVE NEGATIVE Final    Comment: (NOTE) SARS-CoV-2 target nucleic acids are NOT DETECTED.  The SARS-CoV-2 RNA is generally detectable in upper and lower respiratory specimens during the acute phase of infection. The lowest concentration of SARS-CoV-2 viral copies this assay can detect is 250 copies / mL. A negative result does not preclude SARS-CoV-2 infection and should not be used as the sole basis for treatment or other patient management decisions.  A negative result may occur with improper specimen collection / handling, submission of specimen other than nasopharyngeal swab,  presence of viral mutation(s) within the areas targeted by this assay, and inadequate number of viral copies (<250 copies / mL). A negative result must be combined with clinical observations, patient history, and epidemiological information.  Fact Sheet for Patients:   StrictlyIdeas.no  Fact Sheet for Healthcare Providers: BankingDealers.co.za  This test is not yet approved or  cleared by the [place] FDA and has been authorized for detection and/or diagnosis of SARS-CoV-2 by FDA under an Emergency Use Authorization (EUA).  This EUA will remain in effect (meaning this test can be used) for the duration of the COVID-19 declaration under Section 564(b)(1) of the Act, 21 U.S.C. section 360bbb-3(b)(1), unless the authorization is terminated or revoked sooner.  Performed at [facility], [address], [place], [place] [address]   Culture, blood (single)     Status: Abnormal   Collection Time: 12/12/19  4:52 AM   Specimen: BLOOD  Result Value Ref Range Status   Specimen  Description   Final    BLOOD RIGHT ANTECUBITAL Performed at [facility], [address] [place], [place] [address]    Special Requests   Final    BOTTLES DRAWN AEROBIC AND ANAEROBIC Blood Culture adequate volume Performed at [facility], [address], [place], [place] [address]    Culture  Setup Time   Final    GRAM POSITIVE COCCI Gram Stain Report Called to,Read Back By and Verified With: MANNS,J @ 0037 ON 12/13/19 BY JUW AEROBIC BOTTLE ONLY GS DONE @ [facility] Organism ID to follow CRITICAL RESULT CALLED TO, READ BACK BY AND VERIFIED WITH: Melina Schools RN 12/13/19 0600 JDW Performed at [facility], [place] [address], [place], [place] [address]    Culture METHICILLIN RESISTANT STAPHYLOCOCCUS AUREUS (A)  Final   Report Status 12/15/2019 FINAL  Final   Organism ID, Bacteria METHICILLIN RESISTANT STAPHYLOCOCCUS AUREUS  Final      Susceptibility   Methicillin resistant staphylococcus aureus - MIC*    CIPROFLOXACIN >=8 RESISTANT Resistant     ERYTHROMYCIN >=8 RESISTANT Resistant     GENTAMICIN <=0.5 SENSITIVE Sensitive     OXACILLIN >=4 RESISTANT Resistant     TETRACYCLINE <=1 SENSITIVE Sensitive     VANCOMYCIN <=0.5 SENSITIVE Sensitive     TRIMETH/SULFA 160 RESISTANT Resistant     CLINDAMYCIN <=0.25 SENSITIVE Sensitive     RIFAMPIN <=0.5 SENSITIVE Sensitive     Inducible Clindamycin NEGATIVE Sensitive     * METHICILLIN RESISTANT STAPHYLOCOCCUS AUREUS  Blood Culture ID Panel (Reflexed)     Status: Abnormal   Collection Time: 12/12/19  4:52 AM  Result Value Ref Range Status   Enterococcus faecalis NOT DETECTED NOT DETECTED Final   Enterococcus Faecium NOT DETECTED NOT DETECTED Final   Listeria monocytogenes NOT DETECTED NOT DETECTED Final   Staphylococcus species DETECTED (A) NOT DETECTED Final    Comment: CRITICAL RESULT CALLED TO, READ BACK BY AND VERIFIED WITH: J MANNS RN 12/13/19 0600 JDW    Staphylococcus aureus (BCID) DETECTED (A) NOT DETECTED Final    Comment: Methicillin  (oxacillin)-resistant Staphylococcus aureus (MRSA). MRSA is predictably resistant to beta-lactam antibiotics (except ceftaroline). Preferred therapy is vancomycin unless clinically contraindicated. Patient requires contact precautions if  hospitalized. CRITICAL RESULT CALLED TO, READ BACK BY AND VERIFIED WITH: J MANNS RN 12/13/19 0600 JDW    Staphylococcus epidermidis NOT DETECTED NOT DETECTED Final   Staphylococcus lugdunensis NOT DETECTED NOT DETECTED Final   Streptococcus species NOT DETECTED NOT DETECTED Final   Streptococcus agalactiae NOT DETECTED NOT  DETECTED Final   Streptococcus pneumoniae NOT DETECTED NOT DETECTED Final   Streptococcus pyogenes NOT DETECTED NOT DETECTED Final   A.calcoaceticus-baumannii NOT DETECTED NOT DETECTED Final   Bacteroides fragilis NOT DETECTED NOT DETECTED Final   Enterobacterales NOT DETECTED NOT DETECTED Final   Enterobacter cloacae complex NOT DETECTED NOT DETECTED Final   Escherichia coli NOT DETECTED NOT DETECTED Final   Klebsiella aerogenes NOT DETECTED NOT DETECTED Final   Klebsiella oxytoca NOT DETECTED NOT DETECTED Final   Klebsiella pneumoniae NOT DETECTED NOT DETECTED Final   Proteus species NOT DETECTED NOT DETECTED Final   Salmonella species NOT DETECTED NOT DETECTED Final   Serratia marcescens NOT DETECTED NOT DETECTED Final   Haemophilus influenzae NOT DETECTED NOT DETECTED Final   Neisseria meningitidis NOT DETECTED NOT DETECTED Final   Pseudomonas aeruginosa NOT DETECTED NOT DETECTED Final   Stenotrophomonas maltophilia NOT DETECTED NOT DETECTED Final   Candida albicans NOT DETECTED NOT DETECTED Final   Candida auris NOT DETECTED NOT DETECTED Final   Candida glabrata NOT DETECTED NOT DETECTED Final   Candida krusei NOT DETECTED NOT DETECTED Final   Candida parapsilosis NOT DETECTED NOT DETECTED Final   Candida tropicalis NOT DETECTED NOT DETECTED Final   Cryptococcus neoformans/gattii NOT DETECTED NOT DETECTED Final   Meth  resistant mecA/C and MREJ DETECTED (A) NOT DETECTED Final    Comment: CRITICAL RESULT CALLED TO, READ BACK BY AND VERIFIED WITH: J MANNS RN 12/13/19 0600 JDW Performed at [facility], [address] [place], [place] [address]   Culture, blood (single) w Reflex to ID Panel     Status: Abnormal   Collection Time: 12/12/19 10:40 AM   Specimen: BLOOD  Result Value Ref Range Status   Specimen Description   Final    BLOOD RIGHT HAND Performed at [facility], [address], [place], [place] [address]    Special Requests   Final    BOTTLES DRAWN AEROBIC AND ANAEROBIC Blood Culture adequate volume Performed at [facility], [address], [place], [place] [address]    Culture  Setup Time   Final    GRAM POSITIVE COCCI Gram Stain Report Called to,Read Back By and Verified With: BULLINS L. @ 1010 ON [id number] BY HENDERSON L CRITICAL VALUE NOTED.  VALUE IS CONSISTENT WITH PREVIOUSLY REPORTED AND CALLED VALUE. Gram Stain Report Called to,Read Back By and Verified With: PREVIOUSLY CALLED ON 12/13/2019@1010BY  LH  KAY IN BOTH AEROBIC AND ANAEROBIC BOTTLES    Culture (A)  Final    STAPHYLOCOCCUS AUREUS SUSCEPTIBILITIES PERFORMED ON PREVIOUS CULTURE WITHIN THE LAST 5 DAYS. Performed at [facility], [place] [address], [place], [place] [address]    Report Status 12/15/2019 FINAL  Final  Culture, blood (Routine X 2) w Reflex to ID Panel     Status: None   Collection Time: 12/13/19  5:35 PM   Specimen: Right Antecubital; Blood  Result Value Ref Range Status   Specimen Description RIGHT ANTECUBITAL  Final   Special Requests   Final    BOTTLES DRAWN AEROBIC AND ANAEROBIC Blood Culture adequate volume   Culture   Final    NO GROWTH 5 DAYS Performed at [facility], [address] [place], [place] [address]    Report Status 12/18/2019 FINAL  Final  Culture, blood (Routine X 2) w Reflex to ID Panel     Status: None   Collection Time: 12/13/19  5:35 PM   Specimen: BLOOD RIGHT HAND  Result  Value Ref Range  Status   Specimen Description BLOOD RIGHT HAND  Final   Special Requests   Final    BOTTLES DRAWN AEROBIC AND ANAEROBIC Blood Culture adequate volume   Culture   Final    NO GROWTH 5 DAYS Performed at [facility], [address] [place], [place] [address]    Report Status 12/18/2019 FINAL  Final  Blood culture (routine x 2)     Status: None (Preliminary result)   Collection Time: 12/15/19  3:53 PM   Specimen: BLOOD  Result Value Ref Range Status   Specimen Description BLOOD LEFT UPPER ARM  Final   Special Requests   Final    BOTTLES DRAWN AEROBIC AND ANAEROBIC Blood Culture adequate volume   Culture   Final    NO GROWTH 4 DAYS Performed at [facility], [place] [address] [place], [place] [address]    Report Status PENDING  Incomplete  Blood culture (routine x 2)     Status: None (Preliminary result)   Collection Time: 12/15/19  4:35 PM   Specimen: BLOOD LEFT HAND  Result Value Ref Range Status   Specimen Description BLOOD LEFT HAND  Final   Special Requests   Final    BOTTLES DRAWN AEROBIC AND ANAEROBIC Blood Culture adequate volume   Culture   Final    NO GROWTH 4 DAYS Performed at [facility], [place] [address], [place], [place] [address]    Report Status PENDING  Incomplete  SARS Coronavirus 2 by RT PCR (hospital order, performed in [facility] hospital lab) Nasopharyngeal Nasopharyngeal Swab     Status: None   Collection Time: 12/15/19  4:46 PM   Specimen: Nasopharyngeal Swab  Result Value Ref Range Status   SARS Coronavirus 2 NEGATIVE NEGATIVE Final    Comment: (NOTE) SARS-CoV-2 target nucleic acids are NOT DETECTED.  The SARS-CoV-2 RNA is generally detectable in upper and lower respiratory specimens during the acute phase of infection. The lowest concentration of SARS-CoV-2 viral copies this assay can detect is 250 copies / mL. A negative result does not preclude SARS-CoV-2 infection and should not be used as the sole basis for treatment or  other patient management decisions.  A negative result may occur with improper specimen collection / handling, submission of specimen other than nasopharyngeal swab, presence of viral mutation(s) within the areas targeted by this assay, and inadequate number of viral copies (<250 copies / mL). A negative result must be combined with clinical observations, patient history, and epidemiological information.  Fact Sheet for Patients:   StrictlyIdeas.no  Fact Sheet for Healthcare Providers: BankingDealers.co.za  This test is not yet approved or  cleared by the [place] FDA and has been authorized for detection and/or diagnosis of SARS-CoV-2 by FDA under an Emergency Use Authorization (EUA).  This EUA will remain in effect (meaning this test can be used) for the duration of the COVID-19 declaration under Section 564(b)(1) of the Act, 21 U.S.C. section 360bbb-3(b)(1), unless the authorization is terminated or revoked sooner.  Performed at [facility], [place] [address], [place],  [address]   Aerobic/Anaerobic Culture (surgical/deep wound)     Status: None (Preliminary result)   Collection Time: 12/16/19  9:02 AM   Specimen: Wound; Abscess  Result Value Ref Range Status   Specimen Description WOUND  Final   Special Requests LEFT ILIACUS MUSCLE ABCESS  Final   Gram Stain   Final    ABUNDANT WBC PRESENT, PREDOMINANTLY PMN FEW GRAM POSITIVE COCCI IN  PAIRS Performed at [facility], [place] [address], [place], [place] [address]    Culture   Final    MODERATE METHICILLIN RESISTANT STAPHYLOCOCCUS AUREUS NO ANAEROBES ISOLATED; CULTURE IN PROGRESS FOR 5 DAYS    Report Status PENDING  Incomplete   Organism ID, Bacteria METHICILLIN RESISTANT STAPHYLOCOCCUS AUREUS  Final      Susceptibility   Methicillin resistant staphylococcus aureus - MIC*    CIPROFLOXACIN >=8 RESISTANT Resistant     ERYTHROMYCIN >=8 RESISTANT Resistant      GENTAMICIN <=0.5 SENSITIVE Sensitive     OXACILLIN >=4 RESISTANT Resistant     TETRACYCLINE <=1 SENSITIVE Sensitive     VANCOMYCIN <=0.5 SENSITIVE Sensitive     TRIMETH/SULFA >=320 RESISTANT Resistant     CLINDAMYCIN >=8 RESISTANT Resistant     RIFAMPIN <=0.5 SENSITIVE Sensitive     Inducible Clindamycin NEGATIVE Sensitive     * MODERATE METHICILLIN RESISTANT STAPHYLOCOCCUS AUREUS     Scheduled Meds: . busPIRone  5 mg Oral BID  . cloNIDine  0.1 mg Oral BH-qamhs   Followed by  . [START ON 12/20/2019] cloNIDine  0.1 mg Oral QAC breakfast  . enoxaparin (LOVENOX) injection  40 mg Subcutaneous Q24H  . nicotine  21 mg Transdermal Daily  . sodium chloride flush  3 mL Intravenous Q12H   Continuous Infusions: . vancomycin 1,500 mg (12/19/19 1140)     LOS: 4 days   Time spent: 25 minutes  Domenic Polite, MD [facility]

## 2019-12-20 DIAGNOSIS — L0291 Cutaneous abscess, unspecified: Secondary | ICD-10-CM

## 2019-12-20 LAB — CULTURE, BLOOD (ROUTINE X 2)
Culture: NO GROWTH
Culture: NO GROWTH
Special Requests: ADEQUATE
Special Requests: ADEQUATE

## 2019-12-20 LAB — RAPID URINE DRUG SCREEN, HOSP PERFORMED
Amphetamines: POSITIVE — AB
Barbiturates: NOT DETECTED
Benzodiazepines: NOT DETECTED
Cocaine: NOT DETECTED
Opiates: NOT DETECTED
Tetrahydrocannabinol: NOT DETECTED

## 2019-12-20 MED ORDER — SODIUM CHLORIDE 0.9% FLUSH
10.0000 mL | Freq: Two times a day (BID) | INTRAVENOUS | Status: DC
  Administered 2019-12-20 – 2019-12-25 (×4): 10 mL

## 2019-12-20 MED ORDER — SODIUM CHLORIDE 0.9% FLUSH: 10.0000 mL | INTRAVENOUS | Status: DC | PRN

## 2019-12-20 NOTE — Progress Notes (Signed)
Nurse is aware that vancomycin can only infuse for 6 days via midline. States it is still needed at this time.

## 2019-12-20 NOTE — Progress Notes (Signed)
Patient seen and examined, no changes from my note yesterday -Resting comfortably in bed, ambulating without issues -Continue IV vancomycin through 8/31 and then plan for oritavancin followed by oral antibiotic therapy  Domenic Polite, MD

## 2019-12-20 NOTE — Progress Notes (Signed)
Patient's HR is sustaining 110-150s. VS 98.7-111-18-145/100. MD notified.

## 2019-12-20 NOTE — Progress Notes (Signed)
Called by RN.  Patient with elevated heart rate to 130-150 on monitor. Nurse reports that patient's sister brought him some close and shortly after she left his heart rate suddenly elevated.  When the nurse entered the room he was talking the phone with another family member.  She had him sit down but heart rate remains elevated.  Is unsure if his family brought him any licit drugs.  He does have a history of illicit drug use. Denies any chest pain or pressure.  Check EKG now to better evaluate rhythm. Check UDS

## 2019-12-21 LAB — VANCOMYCIN, TROUGH: Vancomycin Tr: 18 ug/mL (ref 15–20)

## 2019-12-21 MED ORDER — METOPROLOL TARTRATE 5 MG/5ML IV SOLN
5.0000 mg | Freq: Once | INTRAVENOUS | Status: AC
  Administered 2019-12-21: 5 mg via INTRAVENOUS
  Filled 2019-12-21: qty 5

## 2019-12-21 NOTE — Progress Notes (Signed)
Pharmacy Antibiotic Note  Jordan [name] is a 29 y.o. male admitted on 12/15/2019 with MRSA bacteremia and osteomyelitis of left sacroiliac joint.  Pharmacy has been consulted for vancomycin dosing.  Today is day#6 of IV abx therapy.  Tentative plan is to continue Vancomycin until 8/31, followed by dalbavancin and an oral course of zyvox.  Patient on maintenance dosing of vancomycin 1500mg  IV q8. Vancomycin trough of 18 is within goal of 15-20.   Plan: Continue vancomycin 1500mg  IV q8h   Goal vancomycin trough range:   15-20  mcg/mL Pharmacy will continue to monitor renal function, vancomycin troughs as clinically appropriate,  cultures and patient progress.   Height: 5\' 10"  (177.8 cm) Weight: 79.4 kg (175 lb 0.8 oz) IBW/kg (Calculated) : 73  Temp (24hrs), Avg:98.9 F (37.2 C), Min:98.5 F (36.9 C), Max:99.5 F (37.5 C)  Recent Labs  Lab 12/15/19 1413 12/16/19 0500 12/18/19 0229 12/19/19 0704 12/21/19 0436  WBC 12.4* 11.9*  --  12.0*  --   CREATININE 0.84 0.85  --  0.76  --   VANCOTROUGH  --   --  9*  --  18    Estimated Creatinine Clearance: 141.9 mL/min (by C-G formula based on SCr of 0.76 mg/dL).    No Known Allergies  Antimicrobials this admission: vancomycin 8/13 >> 8/14 (left AMA), restarted 8/16 >>     Microbiology results: BCx 8/13: MRSA Bcx 8/14: negative Bcx 8/16: negative Muscle wound cx 8/17: mod MRSA 8/13 Resp PCR: SARS CoV-2 negative   Thank you for allowing pharmacy to be a part of this patient's care.  [employer], Pharm.D., BCPS Clinical Pharmacist Clinical phone for 12/21/2019 from 8:30-4:00 is [phone].  **Pharmacist phone directory can be found on [email or web address] listed under [place].  12/21/2019 10:00 AM

## 2019-12-21 NOTE — Progress Notes (Signed)
Patient seen and examined, no changes from previous notes -Remains on IV vancomycin for MRSA bacteremia complicated by iliopsoas abscess and SI osteomyelitis,  -Unfortunately last night he had some relative bring him some clothes after which he was noted to be altered and profoundly tachycardic, overnight UDS obtained which was positive for amphetamines, staff searched the rest of his room -Patient counseled regarding importance of compliance with treatment importance of abstinence from all illicit substances especially in this current setting.  Discussed risk of complications including death with recurrent infections  Domenic Polite, MD

## 2019-12-22 LAB — AEROBIC/ANAEROBIC CULTURE W GRAM STAIN (SURGICAL/DEEP WOUND)

## 2019-12-22 MED ORDER — SODIUM CHLORIDE 0.9 % IV SOLN: INTRAVENOUS | Status: DC

## 2019-12-22 NOTE — Progress Notes (Signed)
PROGRESS NOTE    Jordan [name]  ZDG:[id number] DOB: 28-Jul-1990 DOA: 12/15/2019 PCP: Patient, No Pcp Per    Brief Narrative:  29 year old male with IV drug use, hospitalized on 7/19-7/27 for MRSA bacteremia, TEE negative, treated with vancomycin and then discharged on Zyvox.  Readmitted with fever and worsening low back pain on 8/13 growing MRSA bacteremia and left AMA.  MRI at that time was consistent with osteomyelitis of the left SI joint with underlying collection.  [facility] and unresponsive.  Was using fentanyl prior to that.  He was given Narcan and brought to the ER.  Assessment & Plan:  MRSA bacteremia/IV drug abuse Left SI joint septic arthritis/osteomyelitis, iliopsoas abscess -Blood cultures from recent hospitalization with MRSA, aspiration from left iliac muscle abscess growing MRSA as well -Transthoracic echo unremarkable, TEE on 7/26 was negative -Repeat blood cultures negative  -ID recommends TEE, Dr. Baxter Flattery recommends 2 weeks of IV vancomycin inpatient followed by oritavancin  IV drug use with high risk of opiate withdrawals: Uses large amounts of fentanyl, cocaine, methamphetamine, benzodiazepine. -Patient was started on opiate withdrawal protocol with clonidine, baclofen, NSAIDs.   -8/21-22 overnight snorted Amphetamines inpatient, brought by a friend -Continue current dose of oral opiates -discuss Suboxone at discharge -Counseled  DVT prophylaxis: enoxaparin (LOVENOX) injection 40 mg Start: 12/15/19 2200  Code Status: Full code Family Communication: None Disposition Plan: Status is: Inpatient  Remains inpatient appropriate because: IV drug abuse needing long-term IV antibiotics, plan to continue IV vancomycin until 8/31 and then IV oritavancin at discharge  Dispo: The patient is from: Home              Anticipated d/c is to: Home              Anticipated d/c date is: > 3 days              Patient currently is not  medically stable to d/c.  Consultants:   Infectious disease  Interventional radiology  Procedures:   Aspiration of the SI joint in IR  Antimicrobials:  Antibiotics Given (last 72 hours)    Date/Time Action Medication Dose Rate   12/19/19 2206 New Bag/Given   vancomycin (VANCOREADY) IVPB 1500 mg/300 mL 1,500 mg 150 mL/hr   12/20/19 0615 New Bag/Given   vancomycin (VANCOREADY) IVPB 1500 mg/300 mL 1,500 mg 150 mL/hr   12/20/19 1445 New Bag/Given   vancomycin (VANCOREADY) IVPB 1500 mg/300 mL 1,500 mg 150 mL/hr   12/20/19 2229 New Bag/Given   vancomycin (VANCOREADY) IVPB 1500 mg/300 mL 1,500 mg 150 mL/hr   12/21/19 0459 New Bag/Given   vancomycin (VANCOREADY) IVPB 1500 mg/300 mL 1,500 mg 150 mL/hr   12/21/19 1355 New Bag/Given   vancomycin (VANCOREADY) IVPB 1500 mg/300 mL 1,500 mg 150 mL/hr   12/21/19 2251 New Bag/Given   vancomycin (VANCOREADY) IVPB 1500 mg/300 mL 1,500 mg 150 mL/hr   12/22/19 0603 New Bag/Given   vancomycin (VANCOREADY) IVPB 1500 mg/300 mL 1,500 mg 150 mL/hr         Subjective: -Mild discomfort in his left hip, lower back but overall feeling better  Objective: Vitals:   12/21/19 1700 12/21/19 2024 12/22/19 0551 12/22/19 0839  BP: 123/80 (!) 135/99 138/69 (!) 147/82  Pulse: 100 98 88 (!) 102  Resp: 16 17 14 18   Temp: 99.1 F (37.3 C) 99.1 F (37.3 C) 98.7 F (37.1 C) 98.7 F (37.1 C)  TempSrc: Oral Oral Oral Oral  SpO2: 100% 100% 100% 100%  Weight:      Height:        Intake/Output Summary (Last 24 hours) at 12/22/2019 1406 Last data filed at 12/22/2019 0900 Gross per 24 hour  Intake 240 ml  Output 300 ml  Net -60 ml   Filed Weights   12/15/19 1341 12/16/19 1922 12/17/19 2122  Weight: 79.4 kg 79.4 kg 79.4 kg    Examination:  General exam: Pleasant male sitting up in bed, awake alert oriented x3, no distress HEENT: No JVD CVS: S1-S2, regular rate rhythm Lungs: Clear bilaterally Abdomen: Soft, nontender, bowel sounds  present Extremities: No edema  Neuro: Moves all extremities: No localizing signs   Data Reviewed: I have personally reviewed following labs and imaging studies  CBC: Recent Labs  Lab 12/15/19 1413 12/16/19 0500 12/19/19 0704  WBC 12.4* 11.9* 12.0*  HGB 10.1* 9.6* 10.4*  HCT 33.7* 31.8* 33.6*  MCV 83.6 83.5 83.2  PLT 468* 406* 024*   Basic Metabolic Panel: Recent Labs  Lab 12/15/19 1413 12/16/19 0500 12/19/19 0704  NA 140 137 139  K 3.6 4.1 4.0  CL 105 105 105  CO2 24 25 26   GLUCOSE 103* 91 82  BUN 13 15 16   CREATININE 0.84 0.85 0.76  CALCIUM 9.2 8.8* 9.0   GFR: Estimated Creatinine Clearance: 141.9 mL/min (by C-G formula based on SCr of 0.76 mg/dL). Liver Function Tests: Recent Labs  Lab 12/15/19 1413  AST 26  ALT 21  ALKPHOS 100  BILITOT 0.8  PROT 7.6  ALBUMIN 3.1*   No results for input(s): LIPASE, AMYLASE in the last 168 hours. No results for input(s): AMMONIA in the last 168 hours. Coagulation Profile: No results for input(s): INR, PROTIME in the last 168 hours. Cardiac Enzymes: No results for input(s): CKTOTAL, CKMB, CKMBINDEX, TROPONINI in the last 168 hours. BNP (last 3 results) No results for input(s): PROBNP in the last 8760 hours. HbA1C: No results for input(s): HGBA1C in the last 72 hours. CBG: No results for input(s): GLUCAP in the last 168 hours. Lipid Profile: No results for input(s): CHOL, HDL, LDLCALC, TRIG, CHOLHDL, LDLDIRECT in the last 72 hours. Thyroid Function Tests: No results for input(s): TSH, T4TOTAL, FREET4, T3FREE, THYROIDAB in the last 72 hours. Anemia Panel: No results for input(s): VITAMINB12, FOLATE, FERRITIN, TIBC, IRON, RETICCTPCT in the last 72 hours. Sepsis Labs: No results for input(s): PROCALCITON, LATICACIDVEN in the last 168 hours.  Recent Results (from the past 240 hour(s))  Culture, blood (Routine X 2) w Reflex to ID Panel     Status: None   Collection Time: 12/13/19  5:35 PM   Specimen: Right Antecubital;  Blood  Result Value Ref Range Status   Specimen Description RIGHT ANTECUBITAL  Final   Special Requests   Final    BOTTLES DRAWN AEROBIC AND ANAEROBIC Blood Culture adequate volume   Culture   Final    NO GROWTH 5 DAYS Performed at [facility], [address] [place],  [address]    Report Status 12/18/2019 FINAL  Final  Culture, blood (Routine X 2) w Reflex to ID Panel     Status: None   Collection Time: 12/13/19  5:35 PM   Specimen: BLOOD RIGHT HAND  Result Value Ref Range Status   Specimen Description BLOOD RIGHT HAND  Final   Special Requests   Final    BOTTLES DRAWN AEROBIC AND ANAEROBIC Blood Culture adequate volume   Culture   Final    NO GROWTH  5 DAYS Performed at [facility], [address], [place], [place] [address]    Report Status 12/18/2019 FINAL  Final  Blood culture (routine x 2)     Status: None   Collection Time: 12/15/19  3:53 PM   Specimen: BLOOD  Result Value Ref Range Status   Specimen Description BLOOD LEFT UPPER ARM  Final   Special Requests   Final    BOTTLES DRAWN AEROBIC AND ANAEROBIC Blood Culture adequate volume   Culture   Final    NO GROWTH 5 DAYS Performed at [facility], [place] [address], [place], [place] [address]    Report Status 12/20/2019 FINAL  Final  Blood culture (routine x 2)     Status: None   Collection Time: 12/15/19  4:35 PM   Specimen: BLOOD LEFT HAND  Result Value Ref Range Status   Specimen Description BLOOD LEFT HAND  Final   Special Requests   Final    BOTTLES DRAWN AEROBIC AND ANAEROBIC Blood Culture adequate volume   Culture   Final    NO GROWTH 5 DAYS Performed at [facility], [place] [address], [place], [place] [address]    Report Status 12/20/2019 FINAL  Final  SARS Coronavirus 2 by RT PCR (hospital order, performed in [facility] hospital lab) Nasopharyngeal Nasopharyngeal Swab     Status: None   Collection Time: 12/15/19  4:46 PM   Specimen: Nasopharyngeal Swab  Result Value Ref Range Status    SARS Coronavirus 2 NEGATIVE NEGATIVE Final    Comment: (NOTE) SARS-CoV-2 target nucleic acids are NOT DETECTED.  The SARS-CoV-2 RNA is generally detectable in upper and lower respiratory specimens during the acute phase of infection. The lowest concentration of SARS-CoV-2 viral copies this assay can detect is 250 copies / mL. A negative result does not preclude SARS-CoV-2 infection and should not be used as the sole basis for treatment or other patient management decisions.  A negative result may occur with improper specimen collection / handling, submission of specimen other than nasopharyngeal swab, presence of viral mutation(s) within the areas targeted by this assay, and inadequate number of viral copies (<250 copies / mL). A negative result must be combined with clinical observations, patient history, and epidemiological information.  Fact Sheet for Patients:   StrictlyIdeas.no  Fact Sheet for Healthcare Providers: BankingDealers.co.za  This test is not yet approved or  cleared by the [place] FDA and has been authorized for detection and/or diagnosis of SARS-CoV-2 by FDA under an Emergency Use Authorization (EUA).  This EUA will remain in effect (meaning this test can be used) for the duration of the COVID-19 declaration under Section 564(b)(1) of the Act, 21 U.S.C. section 360bbb-3(b)(1), unless the authorization is terminated or revoked sooner.  Performed at [facility], [place] [address] [place],  [address]   Aerobic/Anaerobic Culture (surgical/deep wound)     Status: None (Preliminary result)   Collection Time: 12/16/19  9:02 AM   Specimen: Wound; Abscess  Result Value Ref Range Status   Specimen Description WOUND  Final   Special Requests LEFT ILIACUS MUSCLE ABCESS  Final   Gram Stain   Final    ABUNDANT WBC PRESENT, PREDOMINANTLY PMN FEW GRAM POSITIVE COCCI IN PAIRS Performed at [facility], [address], [place],  [address]    Culture   Final    MODERATE METHICILLIN RESISTANT STAPHYLOCOCCUS AUREUS NO ANAEROBES ISOLATED; CULTURE IN PROGRESS FOR 5 DAYS  Report Status PENDING  Incomplete   Organism ID, Bacteria METHICILLIN RESISTANT STAPHYLOCOCCUS AUREUS  Final      Susceptibility   Methicillin resistant staphylococcus aureus - MIC*    CIPROFLOXACIN >=8 RESISTANT Resistant     ERYTHROMYCIN >=8 RESISTANT Resistant     GENTAMICIN <=0.5 SENSITIVE Sensitive     OXACILLIN >=4 RESISTANT Resistant     TETRACYCLINE <=1 SENSITIVE Sensitive     VANCOMYCIN <=0.5 SENSITIVE Sensitive     TRIMETH/SULFA >=320 RESISTANT Resistant     CLINDAMYCIN >=8 RESISTANT Resistant     RIFAMPIN <=0.5 SENSITIVE Sensitive     Inducible Clindamycin NEGATIVE Sensitive     * MODERATE METHICILLIN RESISTANT STAPHYLOCOCCUS AUREUS     Scheduled Meds: . busPIRone  5 mg Oral BID  . enoxaparin (LOVENOX) injection  40 mg Subcutaneous Q24H  . nicotine  21 mg Transdermal Daily  . sodium chloride flush  10-40 mL Intracatheter Q12H  . sodium chloride flush  3 mL Intravenous Q12H   Continuous Infusions: . vancomycin 1,500 mg (12/22/19 0603)     LOS: 7 days   Time spent: 25 minutes  Domenic Polite, MD [facility]

## 2019-12-22 NOTE — H&P (View-Only) (Signed)
PROGRESS NOTE    Jordan [name]  ZGY:[id number] DOB: 06-20-1990 DOA: 12/15/2019 PCP: Patient, No Pcp Per    Brief Narrative:  29 year old male with IV drug use, hospitalized on 7/19-7/27 for MRSA bacteremia, TEE negative, treated with vancomycin and then discharged on Zyvox.  Readmitted with fever and worsening low back pain on 8/13 growing MRSA bacteremia and left AMA.  MRI at that time was consistent with osteomyelitis of the left SI joint with underlying collection.  [facility] and unresponsive.  Was using fentanyl prior to that.  He was given Narcan and brought to the ER.  Assessment & Plan:  MRSA bacteremia/IV drug abuse Left SI joint septic arthritis/osteomyelitis, iliopsoas abscess -Blood cultures from recent hospitalization with MRSA, aspiration from left iliac muscle abscess growing MRSA as well -Transthoracic echo unremarkable, TEE on 7/26 was negative -Repeat blood cultures negative  -ID recommends TEE, Dr. Baxter Flattery recommends 2 weeks of IV vancomycin inpatient followed by oritavancin  IV drug use with high risk of opiate withdrawals: Uses large amounts of fentanyl, cocaine, methamphetamine, benzodiazepine. -Patient was started on opiate withdrawal protocol with clonidine, baclofen, NSAIDs.   -8/21-22 overnight snorted Amphetamines inpatient, brought by a friend -Continue current dose of oral opiates -discuss Suboxone at discharge -Counseled  DVT prophylaxis: enoxaparin (LOVENOX) injection 40 mg Start: 12/15/19 2200  Code Status: Full code Family Communication: None Disposition Plan: Status is: Inpatient  Remains inpatient appropriate because: IV drug abuse needing long-term IV antibiotics, plan to continue IV vancomycin until 8/31 and then IV oritavancin at discharge  Dispo: The patient is from: Home              Anticipated d/c is to: Home              Anticipated d/c date is: > 3 days              Patient currently is not  medically stable to d/c.  Consultants:   Infectious disease  Interventional radiology  Procedures:   Aspiration of the SI joint in IR  Antimicrobials:  Antibiotics Given (last 72 hours)    Date/Time Action Medication Dose Rate   12/19/19 2206 New Bag/Given   vancomycin (VANCOREADY) IVPB 1500 mg/300 mL 1,500 mg 150 mL/hr   12/20/19 0615 New Bag/Given   vancomycin (VANCOREADY) IVPB 1500 mg/300 mL 1,500 mg 150 mL/hr   12/20/19 1445 New Bag/Given   vancomycin (VANCOREADY) IVPB 1500 mg/300 mL 1,500 mg 150 mL/hr   12/20/19 2229 New Bag/Given   vancomycin (VANCOREADY) IVPB 1500 mg/300 mL 1,500 mg 150 mL/hr   12/21/19 0459 New Bag/Given   vancomycin (VANCOREADY) IVPB 1500 mg/300 mL 1,500 mg 150 mL/hr   12/21/19 1355 New Bag/Given   vancomycin (VANCOREADY) IVPB 1500 mg/300 mL 1,500 mg 150 mL/hr   12/21/19 2251 New Bag/Given   vancomycin (VANCOREADY) IVPB 1500 mg/300 mL 1,500 mg 150 mL/hr   12/22/19 0603 New Bag/Given   vancomycin (VANCOREADY) IVPB 1500 mg/300 mL 1,500 mg 150 mL/hr         Subjective: -Mild discomfort in his left hip, lower back but overall feeling better  Objective: Vitals:   12/21/19 1700 12/21/19 2024 12/22/19 0551 12/22/19 0839  BP: 123/80 (!) 135/99 138/69 (!) 147/82  Pulse: 100 98 88 (!) 102  Resp: 16 17 14 18   Temp: 99.1 F (37.3 C) 99.1 F (37.3 C) 98.7 F (37.1 C) 98.7 F (37.1 C)  TempSrc: Oral Oral Oral Oral  SpO2: 100% 100% 100% 100%  Weight:      Height:        Intake/Output Summary (Last 24 hours) at 12/22/2019 1406 Last data filed at 12/22/2019 0900 Gross per 24 hour  Intake 240 ml  Output 300 ml  Net -60 ml   Filed Weights   12/15/19 1341 12/16/19 1922 12/17/19 2122  Weight: 79.4 kg 79.4 kg 79.4 kg    Examination:  General exam: Pleasant male sitting up in bed, awake alert oriented x3, no distress HEENT: No JVD CVS: S1-S2, regular rate rhythm Lungs: Clear bilaterally Abdomen: Soft, nontender, bowel sounds  present Extremities: No edema  Neuro: Moves all extremities: No localizing signs   Data Reviewed: I have personally reviewed following labs and imaging studies  CBC: Recent Labs  Lab 12/15/19 1413 12/16/19 0500 12/19/19 0704  WBC 12.4* 11.9* 12.0*  HGB 10.1* 9.6* 10.4*  HCT 33.7* 31.8* 33.6*  MCV 83.6 83.5 83.2  PLT 468* 406* 485*   Basic Metabolic Panel: Recent Labs  Lab 12/15/19 1413 12/16/19 0500 12/19/19 0704  NA 140 137 139  K 3.6 4.1 4.0  CL 105 105 105  CO2 24 25 26   GLUCOSE 103* 91 82  BUN 13 15 16   CREATININE 0.84 0.85 0.76  CALCIUM 9.2 8.8* 9.0   GFR: Estimated Creatinine Clearance: 141.9 mL/min (by C-G formula based on SCr of 0.76 mg/dL). Liver Function Tests: Recent Labs  Lab 12/15/19 1413  AST 26  ALT 21  ALKPHOS 100  BILITOT 0.8  PROT 7.6  ALBUMIN 3.1*   No results for input(s): LIPASE, AMYLASE in the last 168 hours. No results for input(s): AMMONIA in the last 168 hours. Coagulation Profile: No results for input(s): INR, PROTIME in the last 168 hours. Cardiac Enzymes: No results for input(s): CKTOTAL, CKMB, CKMBINDEX, TROPONINI in the last 168 hours. BNP (last 3 results) No results for input(s): PROBNP in the last 8760 hours. HbA1C: No results for input(s): HGBA1C in the last 72 hours. CBG: No results for input(s): GLUCAP in the last 168 hours. Lipid Profile: No results for input(s): CHOL, HDL, LDLCALC, TRIG, CHOLHDL, LDLDIRECT in the last 72 hours. Thyroid Function Tests: No results for input(s): TSH, T4TOTAL, FREET4, T3FREE, THYROIDAB in the last 72 hours. Anemia Panel: No results for input(s): VITAMINB12, FOLATE, FERRITIN, TIBC, IRON, RETICCTPCT in the last 72 hours. Sepsis Labs: No results for input(s): PROCALCITON, LATICACIDVEN in the last 168 hours.  Recent Results (from the past 240 hour(s))  Culture, blood (Routine X 2) w Reflex to ID Panel     Status: None   Collection Time: 12/13/19  5:35 PM   Specimen: Right Antecubital;  Blood  Result Value Ref Range Status   Specimen Description RIGHT ANTECUBITAL  Final   Special Requests   Final    BOTTLES DRAWN AEROBIC AND ANAEROBIC Blood Culture adequate volume   Culture   Final    NO GROWTH 5 DAYS Performed at [facility] [address] [place], [place] [address]    Report Status 12/18/2019 FINAL  Final  Culture, blood (Routine X 2) w Reflex to ID Panel     Status: None   Collection Time: 12/13/19  5:35 PM   Specimen: BLOOD RIGHT HAND  Result Value Ref Range Status   Specimen Description BLOOD RIGHT HAND  Final   Special Requests   Final    BOTTLES DRAWN AEROBIC AND ANAEROBIC Blood Culture adequate volume   Culture   Final    NO GROWTH  5 DAYS Performed at [facility], [address] [place], [place] [address]    Report Status 12/18/2019 FINAL  Final  Blood culture (routine x 2)     Status: None   Collection Time: 12/15/19  3:53 PM   Specimen: BLOOD  Result Value Ref Range Status   Specimen Description BLOOD LEFT UPPER ARM  Final   Special Requests   Final    BOTTLES DRAWN AEROBIC AND ANAEROBIC Blood Culture adequate volume   Culture   Final    NO GROWTH 5 DAYS Performed at [facility], [place] [address] [place], [place] [address]    Report Status 12/20/2019 FINAL  Final  Blood culture (routine x 2)     Status: None   Collection Time: 12/15/19  4:35 PM   Specimen: BLOOD LEFT HAND  Result Value Ref Range Status   Specimen Description BLOOD LEFT HAND  Final   Special Requests   Final    BOTTLES DRAWN AEROBIC AND ANAEROBIC Blood Culture adequate volume   Culture   Final    NO GROWTH 5 DAYS Performed at [facility], [place] [address], [place], [place] [address]    Report Status 12/20/2019 FINAL  Final  SARS Coronavirus 2 by RT PCR (hospital order, performed in [facility] hospital lab) Nasopharyngeal Nasopharyngeal Swab     Status: None   Collection Time: 12/15/19  4:46 PM   Specimen: Nasopharyngeal Swab  Result Value Ref Range Status    SARS Coronavirus 2 NEGATIVE NEGATIVE Final    Comment: (NOTE) SARS-CoV-2 target nucleic acids are NOT DETECTED.  The SARS-CoV-2 RNA is generally detectable in upper and lower respiratory specimens during the acute phase of infection. The lowest concentration of SARS-CoV-2 viral copies this assay can detect is 250 copies / mL. A negative result does not preclude SARS-CoV-2 infection and should not be used as the sole basis for treatment or other patient management decisions.  A negative result may occur with improper specimen collection / handling, submission of specimen other than nasopharyngeal swab, presence of viral mutation(s) within the areas targeted by this assay, and inadequate number of viral copies (<250 copies / mL). A negative result must be combined with clinical observations, patient history, and epidemiological information.  Fact Sheet for Patients:   StrictlyIdeas.no  Fact Sheet for Healthcare Providers: BankingDealers.co.za  This test is not yet approved or  cleared by the [place] FDA and has been authorized for detection and/or diagnosis of SARS-CoV-2 by FDA under an Emergency Use Authorization (EUA).  This EUA will remain in effect (meaning this test can be used) for the duration of the COVID-19 declaration under Section 564(b)(1) of the Act, 21 U.S.C. section 360bbb-3(b)(1), unless the authorization is terminated or revoked sooner.  Performed at [facility], [place] [address], [place], [place] [address]   Aerobic/Anaerobic Culture (surgical/deep wound)     Status: None (Preliminary result)   Collection Time: 12/16/19  9:02 AM   Specimen: Wound; Abscess  Result Value Ref Range Status   Specimen Description WOUND  Final   Special Requests LEFT ILIACUS MUSCLE ABCESS  Final   Gram Stain   Final    ABUNDANT WBC PRESENT, PREDOMINANTLY PMN FEW GRAM POSITIVE COCCI IN PAIRS Performed at [facility], [address], [place], [place] [address]    Culture   Final    MODERATE METHICILLIN RESISTANT STAPHYLOCOCCUS AUREUS NO ANAEROBES ISOLATED; CULTURE IN PROGRESS FOR 5 DAYS  Report Status PENDING  Incomplete   Organism ID, Bacteria METHICILLIN RESISTANT STAPHYLOCOCCUS AUREUS  Final      Susceptibility   Methicillin resistant staphylococcus aureus - MIC*    CIPROFLOXACIN >=8 RESISTANT Resistant     ERYTHROMYCIN >=8 RESISTANT Resistant     GENTAMICIN <=0.5 SENSITIVE Sensitive     OXACILLIN >=4 RESISTANT Resistant     TETRACYCLINE <=1 SENSITIVE Sensitive     VANCOMYCIN <=0.5 SENSITIVE Sensitive     TRIMETH/SULFA >=320 RESISTANT Resistant     CLINDAMYCIN >=8 RESISTANT Resistant     RIFAMPIN <=0.5 SENSITIVE Sensitive     Inducible Clindamycin NEGATIVE Sensitive     * MODERATE METHICILLIN RESISTANT STAPHYLOCOCCUS AUREUS     Scheduled Meds: . busPIRone  5 mg Oral BID  . enoxaparin (LOVENOX) injection  40 mg Subcutaneous Q24H  . nicotine  21 mg Transdermal Daily  . sodium chloride flush  10-40 mL Intracatheter Q12H  . sodium chloride flush  3 mL Intravenous Q12H   Continuous Infusions: . vancomycin 1,500 mg (12/22/19 0603)     LOS: 7 days   Time spent: 25 minutes  Domenic Polite, MD [facility]

## 2019-12-22 NOTE — Progress Notes (Signed)
    [facility] for Infectious Disease    Date of Admission:  12/15/2019   Total days of antibiotics 8/vanco           ID: Jordan [name] is a 29 y.o. male with MRSA bacteremia, iliacus pelvic abscess with concern sI joint septic arthritis Principal Problem:   Acute osteomyelitis, pelvis ([place]) Active Problems:   Polysubstance abuse ([place])   Bacteremia   MRSA bacteremia   Drug overdose   Leukocytosis   Thrombocytosis (H[facility])    Subjective: Afebrile, reports some more low back pain. No diarrhea, no rash, had some swelling to left hand from PIV, Iv now replaced  Medications:  . busPIRone  5 mg Oral BID  . enoxaparin (LOVENOX) injection  40 mg Subcutaneous Q24H  . nicotine  21 mg Transdermal Daily  . sodium chloride flush  10-40 mL Intracatheter Q12H  . sodium chloride flush  3 mL Intravenous Q12H    Objective: Vital signs in last 24 hours: Temp:  [98.7 F (37.1 C)-99.1 F (37.3 C)] 98.7 F (37.1 C) (08/23 0839) Pulse Rate:  [88-102] 102 (08/23 0839) Resp:  [14-18] 18 (08/23 0839) BP: (123-147)/(69-99) 147/82 (08/23 0839) SpO2:  [100 %] 100 % (08/23 0839) Physical Exam  Constitutional: He is oriented to person, place, and time. He appears well-developed and well-nourished. No distress.  HENT:  Mouth/Throat: Oropharynx is clear and moist. No oropharyngeal exudate.  Cardiovascular: Normal rate, regular rhythm and normal heart sounds. Exam reveals no gallop and no friction rub.  No murmur heard.  Pulmonary/Chest: Effort normal and breath sounds normal. No respiratory distress. He has no wheezes.  Abdominal: Soft. Bowel sounds are normal. He exhibits no distension. There is no tenderness.  Lymphadenopathy:  He has no cervical adenopathy.  Neurological: He is alert and oriented to person, place, and time.  Skin: Skin is warm and dry. No rash noted. No erythema.  Psychiatric: He has a normal mood and affect. His behavior is normal.     Lab Results  Microbiology: 8/17  aspirate = MRSA+  8/16 blood cx = NGTD Studies/Results: No results found.   Assessment/Plan: MRSA bacteremia with iliacus abscess/si joint septic arthritis = plan to continue with IV vancomycin. Recommend to get TEE to see if there is any vegetations c/w endocarditis which would push out # of days of treatment. If TEE is negative, then will treat with 2 wk of IV abtx since 8/18 as day 1 then give long acting oritavancin thereafter.  Illicit drug use while hospitalized = found to have new + amphetamine/ while hospitalized. He has been counseled on hospital policy.  [facility] Cell: [phone] Pager: [phone]  12/22/2019, 11:52 AM

## 2019-12-22 NOTE — Progress Notes (Signed)
    [facility] has been requested to perform a transesophageal echocardiogram on Jordan [name] for Bacteremia.  After careful review of history and examination, the risks and benefits of transesophageal echocardiogram have been explained including risks of esophageal damage, perforation (1:10,000 risk), bleeding, pharyngeal hematoma as well as other potential complications associated with conscious sedation including aspiration, arrhythmia, respiratory failure and death. Alternatives to treatment were discussed, questions were answered. Patient is willing to proceed.   Cecilie Kicks, NP  12/22/2019 2:30 PM

## 2019-12-22 NOTE — Progress Notes (Signed)
Chaplain followed up on a request from the day Chaplain that was via the patient for support.  Chaplain arrived and wore PPE.  Patient was up in his room walking around talking to his brother on the phone.  Patient welcomed the visit and acknowledged that having someone to talk things through helps him.  Patient had a girlfriend of 4 years who died in 09-20-22 of this year while the patient was incarcerated.  Patient is still actively grieving and sharing his loss.  Patient feel like the important people in his life are gone and that he never felt like he had a strong Product manager.  Patient would like to use his past mistakes for good and be able to help others.  Patient seems to be understanding himself better and is trying to practice living one day at a time.  Chaplain offered ministry of presence and active listening and words of comfort, support and understanding.  Chaplain available as needed. [place], MDiv.     12/22/19 2033  Clinical Encounter Type  Visited With Patient  Visit Type Psychological support;Spiritual support;Social support;Initial  Referral From Nurse;Patient;Chaplain  Consult/Referral To Chaplain  Stress Factors  Patient Stress Factors Health changes;Loss;Major life changes;Family relationships

## 2019-12-23 ENCOUNTER — Encounter (HOSPITAL_COMMUNITY): Payer: Self-pay | Admitting: Internal Medicine

## 2019-12-23 ENCOUNTER — Encounter (HOSPITAL_COMMUNITY)
Admission: EM | Discharge status: Against Medical Advice | Payer: Self-pay | Source: Home / Self Care | Attending: Internal Medicine

## 2019-12-23 ENCOUNTER — Inpatient Hospital Stay (HOSPITAL_COMMUNITY): Payer: Self-pay | Admitting: Certified Registered Nurse Anesthetist

## 2019-12-23 ENCOUNTER — Inpatient Hospital Stay (HOSPITAL_COMMUNITY): Payer: Self-pay

## 2019-12-23 DIAGNOSIS — R7881 Bacteremia: Secondary | ICD-10-CM

## 2019-12-23 HISTORY — PX: TEE WITHOUT CARDIOVERSION: SHX5443

## 2019-12-23 LAB — BASIC METABOLIC PANEL
Anion gap: 12 (ref 5–15)
BUN: 19 mg/dL (ref 6–20)
CO2: 23 mmol/L (ref 22–32)
Calcium: 9.4 mg/dL (ref 8.9–10.3)
Chloride: 102 mmol/L (ref 98–111)
Creatinine, Ser: 0.76 mg/dL (ref 0.61–1.24)
GFR calc Af Amer: >60 mL/min (ref >60)
GFR calc non Af Amer: >60 mL/min (ref >60)
Glucose, Bld: 96 mg/dL (ref 70–99)
Potassium: 4 mmol/L (ref 3.5–5.1)
Sodium: 137 mmol/L (ref 135–145)

## 2019-12-23 LAB — PROTIME-INR
INR: 1.1 (ref 0.8–1.2)
Prothrombin Time: 14.2 seconds (ref 11.4–15.2)

## 2019-12-23 LAB — VANCOMYCIN, TROUGH: Vancomycin Tr: 51 ug/mL (ref 15–20)

## 2019-12-23 SURGERY — ECHOCARDIOGRAM, TRANSESOPHAGEAL
Anesthesia: General

## 2019-12-23 MED ORDER — PROPOFOL 500 MG/50ML IV EMUL
INTRAVENOUS | Status: DC | PRN
  Administered 2019-12-23: 150 ug/kg/min via INTRAVENOUS

## 2019-12-23 MED ORDER — OXYCODONE HCL 5 MG PO TABS
10.0000 mg | ORAL_TABLET | Freq: Four times a day (QID) | ORAL | Status: DC | PRN
  Administered 2019-12-23 – 2019-12-25 (×7): 10 mg via ORAL
  Filled 2019-12-23 (×7): qty 2

## 2019-12-23 MED ORDER — PROPOFOL 10 MG/ML IV BOLUS
INTRAVENOUS | Status: DC | PRN
  Administered 2019-12-23 (×2): 30 mg via INTRAVENOUS
  Administered 2019-12-23: 50 mg via INTRAVENOUS

## 2019-12-23 MED ORDER — VANCOMYCIN HCL 1500 MG/300ML IV SOLN
1500.0000 mg | Freq: Two times a day (BID) | INTRAVENOUS | Status: DC
  Administered 2019-12-23 – 2019-12-25 (×4): 1500 mg via INTRAVENOUS
  Filled 2019-12-23 (×4): qty 300

## 2019-12-23 MED ORDER — LACTATED RINGERS IV SOLN: INTRAVENOUS | Status: DC

## 2019-12-23 NOTE — Anesthesia Postprocedure Evaluation (Signed)
Anesthesia Post Note  Patient: Jordan [name]  Procedure(s) Performed: TRANSESOPHAGEAL ECHOCARDIOGRAM (TEE) (N/A )     Patient location during evaluation: PACU Anesthesia Type: General Level of consciousness: awake and alert Pain management: pain level controlled Vital Signs Assessment: post-procedure vital signs reviewed and stable Respiratory status: spontaneous breathing, nonlabored ventilation, respiratory function stable and patient connected to nasal cannula oxygen Cardiovascular status: stable and blood pressure returned to baseline Postop Assessment: no apparent nausea or vomiting Anesthetic complications: no   No complications documented.  Last Vitals:  Vitals:   12/23/19 0855 12/23/19 0905  BP: (!) 107/54 (!) 112/52  Pulse: 64 81  Resp: 15 19  Temp:    SpO2: 100% 100%    Last Pain:  Vitals:   12/23/19 0836  TempSrc: Axillary  PainSc:                  [name]

## 2019-12-23 NOTE — Progress Notes (Signed)
  Echocardiogram Echocardiogram Transesophageal has been performed.  Jordan [name] 12/23/2019, 8:39 AM

## 2019-12-23 NOTE — Progress Notes (Signed)
Pharmacy Antibiotic Note  Jordan [name] is a 29 y.o. male admitted on 12/15/2019 with MRSA bacteremia and osteomyelitis of left sacroiliac joint.  Pharmacy has been consulted for vancomycin dosing.   Tentative plan is to continue Vancomycin until 8/31, followed by dalbavancin and an oral course of zyvox.  Trough drawn today is 51 mcg/ml (supratherapeutic) but somewhat hard to interpret as dose was hung late and trough was drawn early. Trough drawn only ~4 hours post dose so likely true trough close to 30 mcg/ml.  Scr remains stable.  Plan: Will change vancomycin to 1500mg  IV q12h Goal vancomycin trough range:   15-20  mcg/mL Pharmacy will continue to monitor renal function, vancomycin troughs as clinically appropriate,  cultures and patient progress.   Height: 5\' 10"  (177.8 cm) Weight: 76.9 kg (169 lb 8.5 oz) IBW/kg (Calculated) : 73  Temp (24hrs), Avg:98.2 F (36.8 C), Min:97.6 F (36.4 C), Max:98.7 F (37.1 C)  Recent Labs  Lab 12/18/19 0229 12/19/19 0704 12/21/19 0436 12/23/19 0450  WBC  --  12.0*  --   --   CREATININE  --  0.76  --  0.76  VANCOTROUGH   < >  --  18 51*   < > = values in this interval not displayed.    Estimated Creatinine Clearance: 141.9 mL/min (by C-G formula based on SCr of 0.76 mg/dL).    No Known Allergies  Antimicrobials this admission: vancomycin 8/13 >> 8/14 (left AMA), restarted 8/16 >>     Microbiology results: BCx 8/13: MRSA Bcx 8/14: negative Bcx 8/16: negative Muscle wound cx 8/17: mod MRSA 8/13 Resp PCR: SARS CoV-2 negative   Thank you for allowing pharmacy to be a part of this patient's care.  [employer], Pharm.D., BCPS Clinical Pharmacist Clinical phone for 12/23/2019 from 8:30-4:00 is [phone].  **Pharmacist phone directory can be found on [email or web address] listed under [place].  12/23/2019 6:34 AM

## 2019-12-23 NOTE — Progress Notes (Signed)
Jordan [name] for Infectious Disease  Date of Admission:  12/15/2019     Total days of antibiotics 12         ASSESSMENT:  Jordan [name] was negative for endocarditis. PICC line without evidence of infection. Will continue with current dose of vancomycin through 9/1 and will receive long acting oritivancin prior to discharge for the completion of therapy. Renal function is stable and vancomycin trough previously therapeutic.  Requesting a letter be faxed on his behalf about the start of his medical condition which will be sent.   PLAN:  1. Continue current dose of vancomycin 2. Monitor renal function and vancomycin levels for therapeutic drug monitoring. 3. Letter to be sent regarding hospitalization per his request. 4. Substance use per primary team.  Principal Problem:   Acute osteomyelitis, pelvis ([place]) Active Problems:   Polysubstance abuse ([place])   Bacteremia   MRSA bacteremia   Drug overdose   Leukocytosis   Thrombocytosis ([place])   . busPIRone  5 mg Oral BID  . enoxaparin (LOVENOX) injection  40 mg Subcutaneous Q24H  . nicotine  21 mg Transdermal Daily  . sodium chloride flush  10-40 mL Intracatheter Q12H  . sodium chloride flush  3 mL Intravenous Q12H    SUBJECTIVE:  Afebrile overnight with no acute events. [name] negative this morning. Continues to have pain with movement on occasion. Asking for a letter to be faxed to regarding his current status of being in the hospital.   No Known Allergies   Review of Systems: Review of Systems  Constitutional: Negative for chills, fever and weight loss.  Respiratory: Negative for cough, shortness of breath and wheezing.   Cardiovascular: Negative for chest pain and leg swelling.  Gastrointestinal: Negative for abdominal pain, constipation, diarrhea, nausea and vomiting.  Skin: Negative for rash.      OBJECTIVE: Vitals:   12/23/19 0845 12/23/19 0855 12/23/19 0905 12/23/19 0920  BP: 115/61 (!) 107/54 (!) 112/52  110/64  Pulse: 81 64 81 61  Resp: 19 15 19 14   Temp:    97.8 F (36.6 C)  TempSrc:    Oral  SpO2: 100% 100% 100% 100%  Weight:      Height:       Body mass index is 24.33 kg/m.  Physical Exam Constitutional:      General: He is not in acute distress.    Appearance: He is well-developed.     Comments: Seated in bed eating; pleasant.   Cardiovascular:     Rate and Rhythm: Normal rate and regular rhythm.     Heart sounds: Normal heart sounds.  Pulmonary:     Effort: Pulmonary effort is normal.     Breath sounds: Normal breath sounds.  Skin:    General: Skin is warm and dry.  Neurological:     Mental Status: He is alert and oriented to person, place, and time.  Psychiatric:        Behavior: Behavior normal.        Thought Content: Thought content normal.        Judgment: Judgment normal.     Lab Results Lab Results  Component Value Date   WBC 12.0 (H) 12/19/2019   HGB 10.4 (L) 12/19/2019   HCT 33.6 (L) 12/19/2019   MCV 83.2 12/19/2019   PLT 489 (H) 12/19/2019    Lab Results  Component Value Date   CREATININE 0.76 12/23/2019   BUN 19 12/23/2019   NA 137 12/23/2019   K  4.0 12/23/2019   CL 102 12/23/2019   CO2 23 12/23/2019    Lab Results  Component Value Date   ALT 21 12/15/2019   AST 26 12/15/2019   ALKPHOS 100 12/15/2019   BILITOT 0.8 12/15/2019     Microbiology: Recent Results (from the past 240 hour(s))  Culture, blood (Routine X 2) w Reflex to ID Panel     Status: None   Collection Time: 12/13/19  5:35 PM   Specimen: Right Antecubital; Blood  Result Value Ref Range Status   Specimen Description RIGHT ANTECUBITAL  Final   Special Requests   Final    BOTTLES DRAWN AEROBIC AND ANAEROBIC Blood Culture adequate volume   Culture   Final    NO GROWTH 5 DAYS Performed at [facility], [address] [place], [place] [address]    Report Status 12/18/2019 FINAL  Final  Culture, blood (Routine X 2) w Reflex to ID Panel     Status: None   Collection  Time: 12/13/19  5:35 PM   Specimen: BLOOD RIGHT HAND  Result Value Ref Range Status   Specimen Description BLOOD RIGHT HAND  Final   Special Requests   Final    BOTTLES DRAWN AEROBIC AND ANAEROBIC Blood Culture adequate volume   Culture   Final    NO GROWTH 5 DAYS Performed at [facility], [address] [place], [place] [address]    Report Status 12/18/2019 FINAL  Final  Blood culture (routine x 2)     Status: None   Collection Time: 12/15/19  3:53 PM   Specimen: BLOOD  Result Value Ref Range Status   Specimen Description BLOOD LEFT UPPER ARM  Final   Special Requests   Final    BOTTLES DRAWN AEROBIC AND ANAEROBIC Blood Culture adequate volume   Culture   Final    NO GROWTH 5 DAYS Performed at [facility], [place] [address], [place], [place] [address]    Report Status 12/20/2019 FINAL  Final  Blood culture (routine x 2)     Status: None   Collection Time: 12/15/19  4:35 PM   Specimen: BLOOD LEFT HAND  Result Value Ref Range Status   Specimen Description BLOOD LEFT HAND  Final   Special Requests   Final    BOTTLES DRAWN AEROBIC AND ANAEROBIC Blood Culture adequate volume   Culture   Final    NO GROWTH 5 DAYS Performed at [facility], [place] [address], [place], [place] [address]    Report Status 12/20/2019 FINAL  Final  SARS Coronavirus 2 by RT PCR (hospital order, performed in [facility] hospital lab) Nasopharyngeal Nasopharyngeal Swab     Status: None   Collection Time: 12/15/19  4:46 PM   Specimen: Nasopharyngeal Swab  Result Value Ref Range Status   SARS Coronavirus 2 NEGATIVE NEGATIVE Final    Comment: (NOTE) SARS-CoV-2 target nucleic acids are NOT DETECTED.  The SARS-CoV-2 RNA is generally detectable in upper and lower respiratory specimens during the acute phase of infection. The lowest concentration of SARS-CoV-2 viral copies this assay can detect is 250 copies / mL. A negative result does not preclude SARS-CoV-2 infection and should not be used as the  sole basis for treatment or other patient management decisions.  A negative result may occur with improper specimen collection / handling, submission of specimen other than nasopharyngeal swab, presence of viral mutation(s) within the areas targeted by this assay, and inadequate number of viral copies (<250 copies /  mL). A negative result must be combined with clinical observations, patient history, and epidemiological information.  Fact Sheet for Patients:   StrictlyIdeas.no  Fact Sheet for Healthcare Providers: BankingDealers.co.za  This test is not yet approved or  cleared by the [place] FDA and has been authorized for detection and/or diagnosis of SARS-CoV-2 by FDA under an Emergency Use Authorization (EUA).  This EUA will remain in effect (meaning this test can be used) for the duration of the COVID-19 declaration under Section 564(b)(1) of the Act, 21 U.S.C. section 360bbb-3(b)(1), unless the authorization is terminated or revoked sooner.  Performed at [facility], [place] [address] [place], [place] [address]   Aerobic/Anaerobic Culture (surgical/deep wound)     Status: None   Collection Time: 12/16/19  9:02 AM   Specimen: Wound; Abscess  Result Value Ref Range Status   Specimen Description WOUND  Final   Special Requests LEFT ILIACUS MUSCLE ABCESS  Final   Gram Stain   Final    ABUNDANT WBC PRESENT, PREDOMINANTLY PMN FEW GRAM POSITIVE COCCI IN PAIRS    Culture   Final    MODERATE METHICILLIN RESISTANT STAPHYLOCOCCUS AUREUS NO ANAEROBES ISOLATED Performed at [facility], [address] [place], [place] [address]    Report Status 12/22/2019 FINAL  Final   Organism ID, Bacteria METHICILLIN RESISTANT STAPHYLOCOCCUS AUREUS  Final      Susceptibility   Methicillin resistant staphylococcus aureus - MIC*    CIPROFLOXACIN >=8 RESISTANT Resistant     ERYTHROMYCIN >=8 RESISTANT Resistant     GENTAMICIN <=0.5  SENSITIVE Sensitive     OXACILLIN >=4 RESISTANT Resistant     TETRACYCLINE <=1 SENSITIVE Sensitive     VANCOMYCIN <=0.5 SENSITIVE Sensitive     TRIMETH/SULFA >=320 RESISTANT Resistant     CLINDAMYCIN >=8 RESISTANT Resistant     RIFAMPIN <=0.5 SENSITIVE Sensitive     Inducible Clindamycin NEGATIVE Sensitive     * MODERATE METHICILLIN RESISTANT STAPHYLOCOCCUS AUREUS     Jordan Piedra, NP [facility]  12/23/2019  3:04 PM

## 2019-12-23 NOTE — Progress Notes (Signed)
PROGRESS NOTE    Jordan [name]  QPY:[id number] DOB: 1990-07-07 DOA: 12/15/2019 PCP: Patient, No Pcp Per    Brief Narrative:  29 year old male with IV drug use, hospitalized on 7/19-7/27 for MRSA bacteremia, TEE negative, treated with vancomycin and then discharged on Zyvox.   -Readmitted  with fever and worsening low back pain on 8/13 growing MRSA bacteremia and left AMA.  MRI at that time was consistent with osteomyelitis of the left SI joint with underlying collection.   -He was found [facility] out [facility] front of [facility], unresponsive.  Was using fentanyl prior to that.  He was given Narcan and brought to the ER. -Restarted on IV vancomycin for recent MRSA bacteremia and osteomyelitis/iliacus abscess, aspiration of SI joint grew MRSA -Infectious disease following, TEE 8/24 negative  Assessment & Plan:  MRSA bacteremia/IV drug abuse Left SI joint septic arthritis/osteomyelitis, iliopsoas abscess -Blood cultures from recent hospitalization with MRSA, aspiration from left iliac muscle abscess growing MRSA as well -Transthoracic echo unremarkable, TEE on 7/26 was negative -Repeat blood cultures negative  -ID recommends TEE, Dr. Baxter Flattery recommends 2 weeks of IV vancomycin inpatient to be completed on 8/31 followed by oritavancin  IV drug use with high risk of opiate withdrawals: Uses large amounts of fentanyl, cocaine, methamphetamine, benzodiazepine. -Patient was started on opiate withdrawal protocol with clonidine, baclofen, NSAIDs.   -8/21-22 overnight snorted Amphetamines inpatient, brought by a friend, counseled again at length -Continue oxycodone and -discuss Suboxone at discharge -Counseled  DVT prophylaxis: enoxaparin (LOVENOX) injection 40 mg Start: 12/15/19 2200  Code Status: Full code Family Communication: None Disposition Plan: Status is: Inpatient  Remains inpatient appropriate because: IV drug abuse needing long-term IV antibiotics, plan to continue IV vancomycin until 8/31 and  then IV oritavancin at discharge  Dispo: The patient is from: Home              Anticipated d/c is to: Home              Anticipated d/c date is: > 3 days              Patient currently is not medically stable to d/c.  Consultants:   Infectious disease  Interventional radiology  Procedures:   Aspiration of the SI joint in IR  TEE: Negative for vegetations  Antimicrobials:  Antibiotics Given (last 72 hours)    Date/Time Action Medication Dose Rate   12/20/19 1445 New Bag/Given   vancomycin (VANCOREADY) IVPB 1500 mg/300 mL 1,500 mg 150 mL/hr   12/20/19 2229 New Bag/Given   vancomycin (VANCOREADY) IVPB 1500 mg/300 mL 1,500 mg 150 mL/hr   12/21/19 0459 New Bag/Given   vancomycin (VANCOREADY) IVPB 1500 mg/300 mL 1,500 mg 150 mL/hr   12/21/19 1355 New Bag/Given   vancomycin (VANCOREADY) IVPB 1500 mg/300 mL 1,500 mg 150 mL/hr   12/21/19 2251 New Bag/Given   vancomycin (VANCOREADY) IVPB 1500 mg/300 mL 1,500 mg 150 mL/hr   12/22/19 0603 New Bag/Given   vancomycin (VANCOREADY) IVPB 1500 mg/300 mL 1,500 mg 150 mL/hr   12/22/19 1414 New Bag/Given   vancomycin (VANCOREADY) IVPB 1500 mg/300 mL 1,500 mg 150 mL/hr   12/23/19 0100 New Bag/Given   vancomycin (VANCOREADY) IVPB 1500 mg/300 mL 1,500 mg 150 mL/hr         Subjective: -Back from TEE, no vegetations noted  Objective: Vitals:   12/23/19 0845 12/23/19 0855 12/23/19 0905 12/23/19 0920  BP: 115/61 (!) 107/54 (!) 112/52 110/64  Pulse: 81 64 81 61  Resp: 19 15 19  14  Temp:    97.8 F (36.6 C)  TempSrc:    Oral  SpO2: 100% 100% 100% 100%  Weight:      Height:        Intake/Output Summary (Last 24 hours) at 12/23/2019 1422 Last data filed at 12/23/2019 1000 Gross per 24 hour  Intake 1230 ml  Output --  Net 1230 ml   Filed Weights   12/16/19 1922 12/17/19 2122 12/22/19 2105  Weight: 79.4 kg 79.4 kg 76.9 kg    Examination:  General exam: Pleasant male sitting up in bed, AAOx3, no distress HEENT: No JVD CVS:  S1-S2, regular rate rhythm Lungs: Clear bilaterally Abdomen: Soft, nontender, bowel sounds present Extremities: No edema Neuro: Moves all extremities: No localizing signs   Data Reviewed: I have personally reviewed following labs and imaging studies  CBC: Recent Labs  Lab 12/19/19 0704  WBC 12.0*  HGB 10.4*  HCT 33.6*  MCV 83.2  PLT 163*   Basic Metabolic Panel: Recent Labs  Lab 12/19/19 0704 12/23/19 0450  NA 139 137  K 4.0 4.0  CL 105 102  CO2 26 23  GLUCOSE 82 96  BUN 16 19  CREATININE 0.76 0.76  CALCIUM 9.0 9.4   GFR: Estimated Creatinine Clearance: 141.9 mL/min (by C-G formula based on SCr of 0.76 mg/dL). Liver Function Tests: No results for input(s): AST, ALT, ALKPHOS, BILITOT, PROT, ALBUMIN in the last 168 hours. No results for input(s): LIPASE, AMYLASE in the last 168 hours. No results for input(s): AMMONIA in the last 168 hours. Coagulation Profile: Recent Labs  Lab 12/23/19 0450  INR 1.1   Cardiac Enzymes: No results for input(s): CKTOTAL, CKMB, CKMBINDEX, TROPONINI in the last 168 hours. BNP (last 3 results) No results for input(s): PROBNP in the last 8760 hours. HbA1C: No results for input(s): HGBA1C in the last 72 hours. CBG: No results for input(s): GLUCAP in the last 168 hours. Lipid Profile: No results for input(s): CHOL, HDL, LDLCALC, TRIG, CHOLHDL, LDLDIRECT in the last 72 hours. Thyroid Function Tests: No results for input(s): TSH, T4TOTAL, FREET4, T3FREE, THYROIDAB in the last 72 hours. Anemia Panel: No results for input(s): VITAMINB12, FOLATE, FERRITIN, TIBC, IRON, RETICCTPCT in the last 72 hours. Sepsis Labs: No results for input(s): PROCALCITON, LATICACIDVEN in the last 168 hours.  Recent Results (from the past 240 hour(s))  Culture, blood (Routine X 2) w Reflex to ID Panel     Status: None   Collection Time: 12/13/19  5:35 PM   Specimen: Right Antecubital; Blood  Result Value Ref Range Status   Specimen Description RIGHT  ANTECUBITAL  Final   Special Requests   Final    BOTTLES DRAWN AEROBIC AND ANAEROBIC Blood Culture adequate volume   Culture   Final    NO GROWTH 5 DAYS Performed at [facility], [address], [place], [place] [address]    Report Status 12/18/2019 FINAL  Final  Culture, blood (Routine X 2) w Reflex to ID Panel     Status: None   Collection Time: 12/13/19  5:35 PM   Specimen: BLOOD RIGHT HAND  Result Value Ref Range Status   Specimen Description BLOOD RIGHT HAND  Final   Special Requests   Final    BOTTLES DRAWN AEROBIC AND ANAEROBIC Blood Culture adequate volume   Culture   Final    NO GROWTH 5 DAYS Performed at [facility], [address], [place], [place] [address]    Report Status 12/18/2019 FINAL  Final  Blood culture (routine x 2)     Status: None   Collection Time: 12/15/19  3:53 PM   Specimen: BLOOD  Result Value Ref Range Status   Specimen Description BLOOD LEFT UPPER ARM  Final   Special Requests   Final    BOTTLES DRAWN AEROBIC AND ANAEROBIC Blood Culture adequate volume   Culture   Final    NO GROWTH 5 DAYS Performed at [facility], [address], [place], [place] [address]    Report Status 12/20/2019 FINAL  Final  Blood culture (routine x 2)     Status: None   Collection Time: 12/15/19  4:35 PM   Specimen: BLOOD LEFT HAND  Result Value Ref Range Status   Specimen Description BLOOD LEFT HAND  Final   Special Requests   Final    BOTTLES DRAWN AEROBIC AND ANAEROBIC Blood Culture adequate volume   Culture   Final    NO GROWTH 5 DAYS Performed at [facility], [place] [address] [place], [place] [address]    Report Status 12/20/2019 FINAL  Final  SARS Coronavirus 2 by RT PCR (hospital order, performed in [facility] hospital lab) Nasopharyngeal Nasopharyngeal Swab     Status: None   Collection Time: 12/15/19  4:46 PM   Specimen: Nasopharyngeal Swab  Result Value Ref Range Status   SARS Coronavirus 2 NEGATIVE NEGATIVE Final    Comment:  (NOTE) SARS-CoV-2 target nucleic acids are NOT DETECTED.  The SARS-CoV-2 RNA is generally detectable in upper and lower respiratory specimens during the acute phase of infection. The lowest concentration of SARS-CoV-2 viral copies this assay can detect is 250 copies / mL. A negative result does not preclude SARS-CoV-2 infection and should not be used as the sole basis for treatment or other patient management decisions.  A negative result may occur with improper specimen collection / handling, submission of specimen other than nasopharyngeal swab, presence of viral mutation(s) within the areas targeted by this assay, and inadequate number of viral copies (<250 copies / mL). A negative result must be combined with clinical observations, patient history, and epidemiological information.  Fact Sheet for Patients:   StrictlyIdeas.no  Fact Sheet for Healthcare Providers: BankingDealers.co.za  This test is not yet approved or  cleared by the [place] FDA and has been authorized for detection and/or diagnosis of SARS-CoV-2 by FDA under an Emergency Use Authorization (EUA).  This EUA will remain in effect (meaning this test can be used) for the duration of the COVID-19 declaration under Section 564(b)(1) of the Act, 21 U.S.C. section 360bbb-3(b)(1), unless the authorization is terminated or revoked sooner.  Performed at [facility], [place] [address], [place], [place] [address]   Aerobic/Anaerobic Culture (surgical/deep wound)     Status: None   Collection Time: 12/16/19  9:02 AM   Specimen: Wound; Abscess  Result Value Ref Range Status   Specimen Description WOUND  Final   Special Requests LEFT ILIACUS MUSCLE ABCESS  Final   Gram Stain   Final    ABUNDANT WBC PRESENT, PREDOMINANTLY PMN FEW GRAM POSITIVE COCCI IN PAIRS    Culture   Final    MODERATE METHICILLIN RESISTANT STAPHYLOCOCCUS AUREUS NO ANAEROBES ISOLATED Performed  at [facility], [address] [place], [place] [address]    Report Status 12/22/2019 FINAL  Final   Organism ID, Bacteria METHICILLIN RESISTANT STAPHYLOCOCCUS AUREUS  Final      Susceptibility   Methicillin resistant staphylococcus aureus - MIC*  CIPROFLOXACIN >=8 RESISTANT Resistant     ERYTHROMYCIN >=8 RESISTANT Resistant     GENTAMICIN <=0.5 SENSITIVE Sensitive     OXACILLIN >=4 RESISTANT Resistant     TETRACYCLINE <=1 SENSITIVE Sensitive     VANCOMYCIN <=0.5 SENSITIVE Sensitive     TRIMETH/SULFA >=320 RESISTANT Resistant     CLINDAMYCIN >=8 RESISTANT Resistant     RIFAMPIN <=0.5 SENSITIVE Sensitive     Inducible Clindamycin NEGATIVE Sensitive     * MODERATE METHICILLIN RESISTANT STAPHYLOCOCCUS AUREUS     Scheduled Meds: . busPIRone  5 mg Oral BID  . enoxaparin (LOVENOX) injection  40 mg Subcutaneous Q24H  . nicotine  21 mg Transdermal Daily  . sodium chloride flush  10-40 mL Intracatheter Q12H  . sodium chloride flush  3 mL Intravenous Q12H   Continuous Infusions: . vancomycin       LOS: 8 days   Time spent: 25 minutes  Domenic Polite, MD [facility]

## 2019-12-23 NOTE — Transfer of Care (Signed)
Immediate Anesthesia Transfer of Care Note  Patient: Jordan [name]  Procedure(s) Performed: TRANSESOPHAGEAL ECHOCARDIOGRAM (TEE) (N/A )  Patient Location: PACU  Anesthesia Type:MAC  Level of Consciousness: drowsy  Airway & Oxygen Therapy: Patient Spontanous Breathing  Post-op Assessment: Report given to RN, Post -op Vital signs reviewed and stable and Patient moving all extremities  Post vital signs: Reviewed and stable  Last Vitals:  Vitals Value Taken Time  BP    Temp    Pulse    Resp    SpO2      Last Pain:  Vitals:   12/23/19 0732  TempSrc: Oral  PainSc: 3       Patients Stated Pain Goal: 0 (07/62/26 3335)  Complications: No complications documented.

## 2019-12-23 NOTE — TOC Initial Note (Signed)
Transition of Care [facility]) - Initial/Assessment Note    Patient Details  Name: Jordan [name] MRN: [id number] Date of Birth: [birth year]  [facility]) CM/SW Contact:    Bartholomew Crews, RN Phone Number: [phone] 12/23/2019, 3:37 PM  Clinical Narrative:                  Spoke with patient at the bedside. PTA home with "mom." Verfied address in Epic as correct. Does not have PCP - discussed [facility] which can also assist with medications if needed.   Also discussed [facility] for assistance with addiction. Patient states that he has taken suboxone in the past which he found to help.   Noted in chart recommendations for IV antibiotics for 6 weeks. Patient cannot dc with PICC line d/t recent history IVDU.   Patient unsure if he will have transportation home at time of discharge. Will follow for transportation assistance.   TOC following for transition needs.   Expected Discharge Plan: Home/Self Care Barriers to Discharge: Continued Medical Work up   Patient Goals and CMS Choice   CMS Medicare.gov Compare Post Acute Care list provided to:: Patient Choice offered to / list presented to : NA  Expected Discharge Plan and Services Expected Discharge Plan: Home/Self Care In-house Referral: Financial Counselor, Clinical Social Work Discharge Planning Services: CM Consult Post Acute Care Choice: NA Living arrangements for the past 2 months: Single Family Home                 DME Arranged: N/A DME Agency: NA       HH Arranged: NA [place] Agency: NA        Prior Living Arrangements/Services Living arrangements for the past 2 months: Single Family Home Lives with:: Self, Parents Patient language and need for interpreter reviewed:: Yes Do you feel safe going back to the place where you live?: Yes            Criminal Activity/Legal Involvement Pertinent to Current Situation/Hospitalization: No - Comment as needed  Activities of Daily  Living Home Assistive Devices/Equipment: None ADL Screening (condition at time of admission) Patient's cognitive ability adequate to safely complete daily activities?: Yes Is the patient deaf or have difficulty hearing?: No Does the patient have difficulty seeing, even when wearing glasses/contacts?: No Does the patient have difficulty concentrating, remembering, or making decisions?: No Patient able to express need for assistance with ADLs?: Yes Does the patient have difficulty dressing or bathing?: No Independently performs ADLs?: Yes (appropriate for developmental age) Does the patient have difficulty walking or climbing stairs?: No Weakness of Legs: None Weakness of Arms/Hands: None  Permission Sought/Granted                  Emotional Assessment Appearance:: Appears stated age Attitude/Demeanor/Rapport: Engaged Affect (typically observed): Accepting Orientation: : Oriented to Self, Oriented to  Time, Oriented to Place, Oriented to Situation Alcohol / Substance Use: Illicit Drugs Psych Involvement: No (comment)  Admission diagnosis:  Abscess [L02.91] Drug overdose [T50.901A] Osteomyelitis, unspecified site, unspecified type [facility]) [M86.9] Patient Active Problem List   Diagnosis Date Noted  . Drug overdose 12/15/2019  . Leukocytosis 12/15/2019  . Thrombocytosis ([place]) 12/15/2019  . MRSA bacteremia 12/13/2019  . Acute osteomyelitis, pelvis ([place]) 12/12/2019  . Acute left-sided low back pain with left-sided sciatica   . Bacteremia   . Sepsis ([place]) 11/17/2019  . Hypokalemia 11/17/2019  . Left sided sciatica 11/17/2019  . Lactic acidosis 11/17/2019  . Hyponatremia  11/17/2019  . Tobacco abuse 11/17/2019  . Polysubstance abuse ([place]) 11/17/2019   PCP:  Patient, No Pcp Per Pharmacy:   [facility] [place], [address] [place] [address] Phone: [phone] Fax:  [place], [place] [address] [place] [address] Phone: [phone] Fax: [phone]  [place], [place] [address] Phone: [phone] Fax: [phone]     Social Determinants of Health (SDOH) Interventions    Readmission Risk Interventions Readmission Risk Prevention Plan 11/18/2019  Transportation Screening Complete

## 2019-12-23 NOTE — Interval H&P Note (Signed)
History and Physical Interval Note:  12/23/2019 8:10 AM  Jordan [name]  has presented today for surgery, with the diagnosis of BACTREMEIA,IV DURG USE.  The various methods of treatment have been discussed with the patient and family. After consideration of risks, benefits and other options for treatment, the patient has consented to  Procedure(s): TRANSESOPHAGEAL ECHOCARDIOGRAM (TEE) (N/A) as a surgical intervention.  The patient's history has been reviewed, patient examined, no change in status, stable for surgery.  I have reviewed the patient's chart and labs.  Questions were answered to the patient's satisfaction.     Bexleigh Theriault

## 2019-12-23 NOTE — Anesthesia Procedure Notes (Signed)
Procedure Name: MAC Date/Time: 12/23/2019 8:10 AM Performed by: Leonor Liv, CRNA Pre-anesthesia Checklist: Patient identified, Emergency Drugs available, Suction available, Patient being monitored and Timeout performed Patient Re-evaluated:Patient Re-evaluated prior to induction Oxygen Delivery Method: Nasal cannula Airway Equipment and Method: Bite block Placement Confirmation: positive ETCO2 Dental Injury: Teeth and Oropharynx as per pre-operative assessment

## 2019-12-23 NOTE — Anesthesia Preprocedure Evaluation (Signed)
Anesthesia Evaluation  Patient identified by MRN, date of birth, ID band Patient awake    Reviewed: Allergy & Precautions, NPO status , Patient's Chart, lab work & pertinent test results  History of Anesthesia Complications (+) history of anesthetic complications  Airway Mallampati: II  TM Distance: >3 FB Neck ROM: Full    Dental  (+) Missing, Dental Advisory Given, Chipped Crowns :   Pulmonary Current Smoker and Patient abstained from smoking.,    Pulmonary exam normal breath sounds clear to auscultation       Cardiovascular Exercise Tolerance: Good Normal cardiovascular exam Rhythm:Regular Rate:Normal  Left ventricular ejection fraction, by estimation, is 60 to 65%. The  left ventricle has normal function. The left ventricle has no regional  wall motion abnormalities. There is mild left ventricular hypertrophy.  Left ventricular diastolic parameters  were normal.  2. Right ventricular systolic function is normal. The right ventricular  size is normal.  3. Left atrial size was mildly dilated.  4. The mitral valve is normal in structure. No evidence of mitral valve  regurgitation. No evidence of mitral stenosis.  5. The aortic valve is tricuspid. Aortic valve regurgitation is not  visualized. No aortic stenosis is present.  6. The inferior vena cava is normal in size with greater than 50%  respiratory variability, suggesting right atrial pressure of 3 mmHg.    Neuro/Psych  Neuromuscular disease negative psych ROS   GI/Hepatic negative GI ROS, (+)     substance abuse  alcohol use, marijuana use, methamphetamine use and IV drug use,   Endo/Other    Renal/GU   negative genitourinary   Musculoskeletal  (+) narcotic dependentBack pain   Abdominal   Peds  Hematology   Anesthesia Other Findings   Reproductive/Obstetrics negative OB ROS                             Anesthesia  Physical  Anesthesia Plan  ASA: IV  Anesthesia Plan: General   Post-op Pain Management:    Induction: Intravenous  PONV Risk Score and Plan: 1 and TIVA  Airway Management Planned: Nasal Cannula, Natural Airway and Simple Face Mask  Additional Equipment:   Intra-op Plan:   Post-operative Plan: Extubation in OR  Informed Consent: I have reviewed the patients History and Physical, chart, labs and discussed the procedure including the risks, benefits and alternatives for the proposed anesthesia with the patient or authorized representative who has indicated his/her understanding and acceptance.     Dental advisory given  Plan Discussed with: CRNA and Surgeon  Anesthesia Plan Comments:         Anesthesia Quick Evaluation

## 2019-12-23 NOTE — Op Note (Signed)
INDICATIONS: MRSA bacteremia  PROCEDURE:   Informed consent was obtained prior to the procedure. The risks, benefits and alternatives for the procedure were discussed and the patient comprehended these risks.  Risks include, but are not limited to, cough, sore throat, vomiting, nausea, somnolence, esophageal and stomach trauma or perforation, bleeding, low blood pressure, aspiration, pneumonia, infection, trauma to the teeth and death.    After a procedural time-out, the oropharynx was anesthetized with 20% benzocaine spray.   During this procedure the patient was administered IV propofol by Anesthesiology. The transesophageal probe was inserted in the esophagus and stomach without difficulty and multiple views were obtained.  The patient was kept under observation until the patient left the procedure room.  The patient left the procedure room in stable condition.   Agitated microbubble saline contrast was not administered.  COMPLICATIONS:    There were no immediate complications.  FINDINGS:  Normal TEE. No vegetations are seen.    Time Spent Directly with the Patient:  30 minutes   Jordan [name] 12/23/2019, 8:33 AM

## 2019-12-23 NOTE — Progress Notes (Signed)
Chaplain responded to Spiritual Consult as patient requested prayer.  Established a relationship of care and concern for patient.  Sat with patient and listened to his story and his hopes for recovery. Patient actively grieving over the death of his girlfriend of over 4 years who died on 08/14/2022.  Patient expressed sadness over relapsing after 8 months of sobriety. Patient sees his time here as just what he needed to get back on track.   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    Patient requested additional visits.  Chaplain plans to follow up several more times with this patient.  [place]

## 2019-12-24 ENCOUNTER — Encounter (HOSPITAL_COMMUNITY): Payer: Self-pay | Admitting: Cardiovascular Disease

## 2019-12-24 DIAGNOSIS — L0291 Cutaneous abscess, unspecified: Secondary | ICD-10-CM

## 2019-12-24 NOTE — Discharge Summary (Signed)
Physician Discharge Summary  Jordan [name] SFK:[id number] DOB: 08-Nov-1990 DOA: 12/12/2019  PCP: Patient, No Pcp Per  Admit date: 12/12/2019 Discharge date: 12/13/2019  Admitted From: Home Disposition: [place]     Discharge Condition: AGAINST MEDICAL ADVICE    Brief/Interim Summary: 29 y.o.malewith medical history ofpolysubstance abuse including tobacco, methamphetamine, and cocaine,MRSA bacteremia in July 2021 presenting with left gluteal/buttock pain for about 3 days duration. He states that he is having difficulty ambulating and bearing weight on his left leg because of his left buttock pain. He has had fevers and chills at home up to 1 to 1.5 F. In addition, the patient states that he has continued to use IV heroin as well as IV methamphetamine. He states that he last used IV illicit drugs approximately 4 to 5 days prior to this admission. He denied any nausea, vomiting, diarrhea, abdominal pain, dysuria, hematuria, hematochezia, melena, hemoptysis. He denies any neck pain or back pain. He denies any headache, visual disturbance or focal extremity weakness. Notably, the patient was admitted to the hospital from 11/17/2019 to 11/25/2019 when he was treated for MRSA bacteremia. Repeat blood cultures were negative at that time. TEE was negative for vegetation. CT of the thoracic spine was negative at that time. MRI of the lumbar spine was also negative. The patient was treated with daptomycin during his hospitalization, and he was sent home with Zyvox. He states that he has not taken his Zyvox for the last 3 days because he has been having difficulty getting out of bed secondary to his left buttock pain. In the emergency department, the patient was afebrile hemodynamically stable with oxygen saturation 100% on room air. BMP and LFTs were unremarkable. WBC 13.0, hemoglobin 10.5, platelets 209,000. Urine drug screen was positive for cocaine, THC, amphetamine.  Lactic acid was 2.0. CRP was 12.7. ESR 89. MRI of the pelvis showed a severe bone marrow edema around the left SI joint with a 1.7 x 2.5 collection deep to the iliopsoas muscle. There is muscle edema also in the superior aspect of the left piriformis and left gluteus maximus. The patient was started on IV vancomycin.  On the evening of 12/13/19 around 8:20PM, the patient left AMA.  He did not want to see any physician at that time.  He signed the paperwork and left AMA.  Throughout the hospitalization, the patient exhibited insight that leaving ama would lead to worsening infection and possible death.  Discharge Diagnoses:  Acute osteomyelitis of pelvis -Continue IV vancomycin -IR consult to determine feasibility of aspiration of SI joint -Follow blood cultures -CRP 12.7 -ESR 89 -judicious pain control--he appears a bit histrionic -IR for aspiration of SI joint on 8/16 -change norco to percocet po  MRSA Bacteremia -repeat blood cultures -Repeat limited echo -continue vanco IV  Polysubstance abuse -Including cocaine, tobacco, amphetamine, THC -Cessation discussed  Elevated Lactate -due to infectious process -improved with IVF   Discharge Instructions   Allergies as of 12/14/2019   No Known Allergies     Medication List    ASK your doctor about these medications   acetaminophen 325 MG tablet Commonly known as: TYLENOL Take 2 tablets (650 mg total) by mouth every 6 (six) hours as needed for mild pain or headache (or Fever >/= 101).   busPIRone 5 MG tablet Commonly known as: BUSPAR Take 1 tablet (5 mg total) by mouth 2 (two) times daily.   linezolid 600 MG tablet Commonly known as: Zyvox Take 1 tablet (600 mg total) by mouth  2 (two) times daily for 28 days. Ask about: Should I take this medication?   methocarbamol 500 MG tablet Commonly known as: ROBAXIN Take 1 tablet (500 mg total) by mouth every 8 (eight) hours as needed for muscle spasms.   traMADol 50  MG tablet Commonly known as: ULTRAM Take 1 tablet (50 mg total) by mouth every 8 (eight) hours as needed for severe pain.       No Known Allergies  Consultations:  non   Procedures/Studies: CT PELVIS W CONTRAST  Result Date: 12/12/2019 CLINICAL DATA:  Left leg pain. Recent hospitalization for MRSA. Question osteomyelitis. EXAM: CT PELVIS WITH CONTRAST TECHNIQUE: Multidetector CT imaging of the pelvis was performed using the standard protocol following the bolus administration of intravenous contrast. CONTRAST:  146m OMNIPAQUE IOHEXOL 300 MG/ML  SOLN COMPARISON:  None. FINDINGS: Urinary Tract: Distal ureters and urinary bladder are within normal limits. Bowel:  Unremarkable visualized pelvic bowel loops. Vascular/Lymphatic: No pathologically enlarged lymph nodes. No significant vascular abnormality seen. Reproductive:  Prostate is unremarkable. Musculoskeletal: Osseous pelvis is unremarkable. The hips are located and within normal limits. Proximal femurs are normal bilaterally. No discrete soft tissue abscess or focal osteolysis is present. There is some motion artifact. IMPRESSION: 1. No osseous abnormality in the pelvis or proximal femurs. 2. Normal CT of the pelvis. Electronically Signed   By: CSan MorelleM.D.   On: 12/12/2019 04:52   MR PELVIS WO CONTRAST  Result Date: 12/12/2019 CLINICAL DATA:  Elevated ESR. MRSA bacteremia in July. Left posterior pelvic pain. Left buttock pain. EXAM: MRI PELVIS WITHOUT CONTRAST TECHNIQUE: Multiplanar multisequence MR imaging of the pelvis was performed. No intravenous contrast was administered. COMPARISON:  None. FINDINGS: Bones: No hip fracture, dislocation or avascular necrosis. No periosteal reaction or bone destruction. No aggressive osseous lesion. 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 Severe bone marrow edema centered around the left sacroiliac joint with adjacent soft tissue edema with a left SI joint effusion which is decompressing  anteriorly where there is a focal 1.7 x 2.5 cm component deep to the iliopsoas muscle. Articular cartilage and labrum Articular cartilage:  No chondral defect. Labrum: Grossly intact, but evaluation is limited by lack of intraarticular fluid. Joint or bursal effusion Joint effusion:  No hip joint effusion.  No SI joint effusion. Bursae:  No bursa formation. Muscles and tendons Flexors: Normal. Extensors: Normal. Abductors: Normal. Adductors: Normal. Rotators: Muscle edema in the superior aspect of the left piriformis muscle adjacent to the inferior aspect of the left sacroiliac joint/which may be reactive versus secondary to infectious myositis. There is mild muscle edema in the left gluteus maximus muscle also contiguous with the posterior aspect of the left sacroiliac joint which may be again reactive or secondary to infectious myositis. No intramuscular fluid collection. Hamstrings: Normal. Other findings No pelvic free fluid. No fluid collection or hematoma. No inguinal lymphadenopathy. No inguinal hernia. IMPRESSION: 1. Severe bone marrow edema centered around the left sacroiliac joint with a left SI joint effusion which is decompressing anteriorly where there is a focal 1.7 x 2.5 cm component deep to the iliopsoas muscle. Findings are most consistent with osteomyelitis of the left sacroiliac joint. 2. Muscle edema in the superior aspect of the left piriformis muscle adjacent to the inferior aspect of the left sacroiliac and to lesser extent mild muscle edema in the left gluteus maximus muscle which may be reactive or secondary to infectious myositis. No intramuscular fluid collection. Electronically Signed   By: HKathreen Devoid  On: 12/12/2019 09:00   CT ASPIRATION  Result Date: 12/16/2019 INDICATION: 29 year old male with a history of left-sided hip pain, iliacus abscess, referred for aspiration EXAM: IMAGE GUIDED ASPIRATION OF LEFT PELVIC ABSCESS MEDICATIONS: The patient is currently admitted to the  hospital and receiving intravenous antibiotics. The antibiotics were administered within an appropriate time frame prior to the initiation of the procedure. ANESTHESIA/SEDATION: Fentanyl 1.0 mcg IV; Versed 50 mg IV Moderate Sedation Time:  12 minutes The patient was continuously monitored during the procedure by the interventional radiology nurse under my direct supervision. COMPLICATIONS: None PROCEDURE: Informed written consent was obtained from the patient after a thorough discussion of the procedural risks, benefits and alternatives. All questions were addressed. Maximal Sterile Barrier Technique was utilized including caps, mask, sterile gowns, sterile gloves, sterile drape, hand hygiene and skin antiseptic. A timeout was performed prior to the initiation of the procedure. Patient positioned supine position on the CT gantry table. Scout CT was acquired for planning purposes. The patient was then prepped and draped in the usual sterile fashion. 1% lidocaine was used for local anesthesia. Using CT guidance, trocar needle was advanced from anterior inferior left pelvis to the fluid collection overlying the left sacroiliac joint. We confirmed needle tip placement with CT guidance, and then aspirated approximately 5-6 cc of murky bloody fluid. Sample sent for culture. Needle was removed and a sterile bandage was placed. Patient tolerated the procedure well and remained hemodynamically stable throughout. No complications were encountered and no significant blood loss. IMPRESSION: Status post CT-guided aspiration of left pelvic abscess. Signed, Dulcy Fanny. Dellia Nims, RPVI [facility] Electronically Signed   By: Corrie Mckusick D.O.   On: 12/16/2019 10:49   ECHOCARDIOGRAM COMPLETE  Result Date: 12/16/2019    ECHOCARDIOGRAM REPORT   Patient Name:   Jordan [name] Date of Exam: 12/16/2019 Medical Rec #:  [id number]      Height:       70.0 in Accession #:     [id number]     Weight:       175.0 lb Date of Birth:  1990/05/22       BSA:          1.972 m Patient Age:    29 years       BP:           104/70 mmHg Patient Gender: M              HR:           80 bpm. Exam Location:  Inpatient Procedure: 2D Echo, Cardiac Doppler and Color Doppler Indications:    Bacteremia 790.7 / R78.81  History:        Patient has prior history of Echocardiogram examinations, most                 recent 11/24/2019. Polysubstance abuse.  Sonographer:    Tiffany Dance Referring Phys: [id number] RONDELL A SMITH IMPRESSIONS  1. Left ventricular ejection fraction, by estimation, is 55 to 60%. The left ventricle has normal function. The left ventricle has no regional wall motion abnormalities. Left ventricular diastolic parameters were normal.  2. Right ventricular systolic function is normal. The right ventricular size is normal. Tricuspid regurgitation signal is inadequate for assessing PA pressure.  3. The mitral valve is normal in structure. No evidence of mitral valve regurgitation.  4. The aortic valve is tricuspid. Aortic valve regurgitation is not visualized. No aortic stenosis is present.  5. The  inferior vena cava is dilated in size with >50% respiratory variability, suggesting right atrial pressure of 8 mmHg. Conclusion(s)/Recommendation(s): No vegetation seen. If high clinical suspicion for endocarditis, consider TEE. FINDINGS  Left Ventricle: Left ventricular ejection fraction, by estimation, is 55 to 60%. The left ventricle has normal function. The left ventricle has no regional wall motion abnormalities. The left ventricular internal cavity size was normal in size. There is  no left ventricular hypertrophy. Left ventricular diastolic parameters were normal. Right Ventricle: The right ventricular size is normal. Right vetricular wall thickness was not assessed. Right ventricular systolic function is normal. Tricuspid regurgitation signal is inadequate for assessing PA pressure. Left Atrium:  Left atrial size was normal in size. Right Atrium: Right atrial size was normal in size. Pericardium: There is no evidence of pericardial effusion. Mitral Valve: The mitral valve is normal in structure. No evidence of mitral valve regurgitation. Tricuspid Valve: The tricuspid valve is normal in structure. Tricuspid valve regurgitation is trivial. Aortic Valve: The aortic valve is tricuspid. Aortic valve regurgitation is not visualized. No aortic stenosis is present. Pulmonic Valve: The pulmonic valve was not well visualized. Pulmonic valve regurgitation is not visualized. Aorta: The aortic root and ascending aorta are structurally normal, with no evidence of dilitation. Venous: The inferior vena cava is dilated in size with greater than 50% respiratory variability, suggesting right atrial pressure of 8 mmHg. IAS/Shunts: The interatrial septum was not well visualized.  LEFT VENTRICLE PLAX 2D LVIDd:         5.10 cm  Diastology LVIDs:         3.30 cm  LV e' lateral:   14.80 cm/s LV PW:         1.00 cm  LV E/e' lateral: 6.4 LV IVS:        1.00 cm  LV e' medial:    11.00 cm/s LVOT diam:     2.20 cm  LV E/e' medial:  8.6 LV SV:         87 LV SV Index:   44 LVOT Area:     3.80 cm  RIGHT VENTRICLE             IVC RV Basal diam:  3.10 cm     IVC diam: 2.10 cm RV Mid diam:    2.90 cm RV S prime:     17.00 cm/s TAPSE (M-mode): 2.1 cm LEFT ATRIUM             Index       RIGHT ATRIUM           Index LA diam:        3.80 cm 1.93 cm/m  RA Area:     16.50 cm LA Vol (A2C):   64.7 ml 32.81 ml/m RA Volume:   44.80 ml  22.72 ml/m LA Vol (A4C):   64.3 ml 32.60 ml/m LA Biplane Vol: 64.1 ml 32.50 ml/m  AORTIC VALVE LVOT Vmax:   117.00 cm/s LVOT Vmean:  79.000 cm/s LVOT VTI:    0.229 m  AORTA Ao Root diam: 3.10 cm Ao Asc diam:  2.40 cm MITRAL VALVE MV Area (PHT): 3.37 cm    SHUNTS MV Decel Time: 225 msec    Systemic VTI:  0.23 m MV E velocity: 94.60 cm/s  Systemic Diam: 2.20 cm MV A velocity: 53.60 cm/s MV E/A ratio:  1.76  Oswaldo Milian MD Electronically signed by Oswaldo Milian MD Signature Date/Time: 12/16/2019/8:32:25 PM    Final    ECHO  TEE  Result Date: 12/23/2019    TRANSESOPHOGEAL ECHO REPORT   Patient Name:   Jordan [name] Date of Exam: 12/23/2019 Medical Rec #:  [id number]      Height:       70.0 in Accession #:    [id number]     Weight:       169.5 lb Date of Birth:  12/07/1990       BSA:          1.946 m Patient Age:    29 years       BP:           111/49 mmHg Patient Gender: M              HR:           69 bpm. Exam Location:  Inpatient Procedure: Transesophageal Echo Indications:    bacteremia  History:        Patient has prior history of Echocardiogram examinations, most                 recent 12/16/2019. Risk Factors:Current Smoker. Polysubstance                 abuse.  Sonographer:    Jannett Celestine RDCS (AE) Referring Phys: [place]: The transesophogeal probe was passed without difficulty through the esophogus of the patient. Sedation performed by different physician. The patient's vital signs; including heart rate, blood pressure, and oxygen saturation; remained stable throughout the procedure. The patient developed no complications during the procedure. IMPRESSIONS  1. Left ventricular ejection fraction, by estimation, is 60 to 65%. The left ventricle has normal function. The left ventricle has no regional wall motion abnormalities.  2. Right ventricular systolic function is normal. The right ventricular size is normal.  3. No left atrial/left atrial appendage thrombus was detected.  4. The mitral valve is normal in structure. No evidence of mitral valve regurgitation. No evidence of mitral stenosis.  5. The aortic valve is normal in structure. Aortic valve regurgitation is not visualized. No aortic stenosis is present.  6. The inferior vena cava is normal in size with greater than 50% respiratory variability, suggesting right atrial pressure of 3 mmHg.  Conclusion(s)/Recommendation(s): Normal biventricular function without evidence of hemodynamically significant valvular heart disease. No evidence of vegetation/infective endocarditis on this transesophageal echocardiogram. FINDINGS  Left Ventricle: Left ventricular ejection fraction, by estimation, is 60 to 65%. The left ventricle has normal function. The left ventricle has no regional wall motion abnormalities. The left ventricular internal cavity size was normal in size. There is  no left ventricular hypertrophy. Right Ventricle: The right ventricular size is normal. No increase in right ventricular wall thickness. Right ventricular systolic function is normal. Left Atrium: Left atrial size was normal in size. No left atrial/left atrial appendage thrombus was detected. Right Atrium: Right atrial size was normal in size. Pericardium: There is no evidence of pericardial effusion. Mitral Valve: The mitral valve is normal in structure. Normal mobility of the mitral valve leaflets. No evidence of mitral valve regurgitation. No evidence of mitral valve stenosis. Tricuspid Valve: The tricuspid valve is normal in structure. Tricuspid valve regurgitation is not demonstrated. No evidence of tricuspid stenosis. Aortic Valve: The aortic valve is normal in structure. Aortic valve regurgitation is not visualized. No aortic stenosis is present. Pulmonic Valve: The pulmonic valve was normal in structure. Pulmonic valve regurgitation is trivial. No evidence of pulmonic stenosis. Aorta: The aortic root is normal in size and  structure. Venous: The inferior vena cava is normal in size with greater than 50% respiratory variability, suggesting right atrial pressure of 3 mmHg. IAS/Shunts: No atrial level shunt detected by color flow Doppler. Sanda Klein MD Electronically signed by Sanda Klein MD Signature Date/Time: 12/23/2019/1:52:04 PM    Final          Discharge Exam: Vitals:   12/13/19 1504 12/13/19 2021  BP: (!)  143/83 130/87  Pulse: 84 96  Resp: 18 16  Temp: 98.9 F (37.2 C) 98.6 F (37 C)  SpO2: 100% 100%   Vitals:   12/12/19 2344 12/13/19 0317 12/13/19 1504 12/13/19 2021  BP: 119/60 (!) 117/95 (!) 143/83 130/87  Pulse: 85 87 84 96  Resp: _0 Temp: 99.4 F (37.4 C) 100.3 F (37.9 C) 98.9 F (37.2 C) 98.6 F (37 C)  TempSrc: Oral Oral Oral Oral  SpO2: 100% 98% 100% 100%  Weight:      Height:         The results of significant diagnostics from this hospitalization (including imaging, microbiology, ancillary and laboratory) are listed below for reference.    Significant Diagnostic Studies: CT PELVIS W CONTRAST  Result Date: 12/12/2019 CLINICAL DATA:  Left leg pain. Recent hospitalization for MRSA. Question osteomyelitis. EXAM: CT PELVIS WITH CONTRAST TECHNIQUE: Multidetector CT imaging of the pelvis was performed using the standard protocol following the bolus administration of intravenous contrast. CONTRAST:  127m OMNIPAQUE IOHEXOL 300 MG/ML  SOLN COMPARISON:  None. FINDINGS: Urinary Tract: Distal ureters and urinary bladder are within normal limits. Bowel:  Unremarkable visualized pelvic bowel loops. Vascular/Lymphatic: No pathologically enlarged lymph nodes. No significant vascular abnormality seen. Reproductive:  Prostate is unremarkable. Musculoskeletal: Osseous pelvis is unremarkable. The hips are located and within normal limits. Proximal femurs are normal bilaterally. No discrete soft tissue abscess or focal osteolysis is present. There is some motion artifact. IMPRESSION: 1. No osseous abnormality in the pelvis or proximal femurs. 2. Normal CT of the pelvis. Electronically Signed   By: CSan MorelleM.D.   On: 12/12/2019 04:52   MR PELVIS WO CONTRAST  Result Date: 12/12/2019 CLINICAL DATA:  Elevated ESR. MRSA bacteremia in July. Left posterior pelvic pain. Left buttock pain. EXAM: MRI PELVIS WITHOUT CONTRAST TECHNIQUE: Multiplanar multisequence MR imaging of the  pelvis was performed. No intravenous contrast was administered. COMPARISON:  None. FINDINGS: Bones: No hip fracture, dislocation or avascular necrosis. No periosteal reaction or bone destruction. No aggressive osseous lesion. 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 Severe bone marrow edema centered around the left sacroiliac joint with adjacent soft tissue edema with a left SI joint effusion which is decompressing anteriorly where there is a focal 1.7 x 2.5 cm component deep to the iliopsoas muscle. Articular cartilage and labrum Articular cartilage:  No chondral defect. Labrum: Grossly intact, but evaluation is limited by lack of intraarticular fluid. Joint or bursal effusion Joint effusion:  No hip joint effusion.  No SI joint effusion. Bursae:  No bursa formation. Muscles and tendons Flexors: Normal. Extensors: Normal. Abductors: Normal. Adductors: Normal. Rotators: Muscle edema in the superior aspect of the left piriformis muscle adjacent to the inferior aspect of the left sacroiliac joint/which may be reactive versus secondary to infectious myositis. There is mild muscle edema in the left gluteus maximus muscle also contiguous with the posterior aspect of the left sacroiliac joint which may be again reactive or secondary to infectious myositis. No intramuscular fluid collection. Hamstrings: Normal. Other findings No pelvic free fluid.  No fluid collection or hematoma. No inguinal lymphadenopathy. No inguinal hernia. IMPRESSION: 1. Severe bone marrow edema centered around the left sacroiliac joint with a left SI joint effusion which is decompressing anteriorly where there is a focal 1.7 x 2.5 cm component deep to the iliopsoas muscle. Findings are most consistent with osteomyelitis of the left sacroiliac joint. 2. Muscle edema in the superior aspect of the left piriformis muscle adjacent to the inferior aspect of the left sacroiliac and to lesser extent mild muscle edema in the left gluteus maximus muscle  which may be reactive or secondary to infectious myositis. No intramuscular fluid collection. Electronically Signed   By: Kathreen Devoid   On: 12/12/2019 09:00   CT ASPIRATION  Result Date: 12/16/2019 INDICATION: 29 year old male with a history of left-sided hip pain, iliacus abscess, referred for aspiration EXAM: IMAGE GUIDED ASPIRATION OF LEFT PELVIC ABSCESS MEDICATIONS: The patient is currently admitted to the hospital and receiving intravenous antibiotics. The antibiotics were administered within an appropriate time frame prior to the initiation of the procedure. ANESTHESIA/SEDATION: Fentanyl 1.0 mcg IV; Versed 50 mg IV Moderate Sedation Time:  12 minutes The patient was continuously monitored during the procedure by the interventional radiology nurse under my direct supervision. COMPLICATIONS: None PROCEDURE: Informed written consent was obtained from the patient after a thorough discussion of the procedural risks, benefits and alternatives. All questions were addressed. Maximal Sterile Barrier Technique was utilized including caps, mask, sterile gowns, sterile gloves, sterile drape, hand hygiene and skin antiseptic. A timeout was performed prior to the initiation of the procedure. Patient positioned supine position on the CT gantry table. Scout CT was acquired for planning purposes. The patient was then prepped and draped in the usual sterile fashion. 1% lidocaine was used for local anesthesia. Using CT guidance, trocar needle was advanced from anterior inferior left pelvis to the fluid collection overlying the left sacroiliac joint. We confirmed needle tip placement with CT guidance, and then aspirated approximately 5-6 cc of murky bloody fluid. Sample sent for culture. Needle was removed and a sterile bandage was placed. Patient tolerated the procedure well and remained hemodynamically stable throughout. No complications were encountered and no significant blood loss. IMPRESSION: Status post CT-guided  aspiration of left pelvic abscess. Signed, Dulcy Fanny. Dellia Nims, RPVI [facility] Electronically Signed   By: Corrie Mckusick D.O.   On: 12/16/2019 10:49   ECHOCARDIOGRAM COMPLETE  Result Date: 12/16/2019    ECHOCARDIOGRAM REPORT   Patient Name:   KENWOOD ROSIAK Date of Exam: 12/16/2019 Medical Rec #:  [id number]      Height:       70.0 in Accession #:    [id number]     Weight:       175.0 lb Date of Birth:  1990/10/22       BSA:          1.972 m Patient Age:    29 years       BP:           104/70 mmHg Patient Gender: M              HR:           80 bpm. Exam Location:  Inpatient Procedure: 2D Echo, Cardiac Doppler and Color Doppler Indications:    Bacteremia 790.7 / R78.81  History:        Patient has prior history of Echocardiogram examinations, most  recent 11/24/2019. Polysubstance abuse.  Sonographer:    Tiffany Dance Referring Phys: [id number] RONDELL A SMITH IMPRESSIONS  1. Left ventricular ejection fraction, by estimation, is 55 to 60%. The left ventricle has normal function. The left ventricle has no regional wall motion abnormalities. Left ventricular diastolic parameters were normal.  2. Right ventricular systolic function is normal. The right ventricular size is normal. Tricuspid regurgitation signal is inadequate for assessing PA pressure.  3. The mitral valve is normal in structure. No evidence of mitral valve regurgitation.  4. The aortic valve is tricuspid. Aortic valve regurgitation is not visualized. No aortic stenosis is present.  5. The inferior vena cava is dilated in size with >50% respiratory variability, suggesting right atrial pressure of 8 mmHg. Conclusion(s)/Recommendation(s): No vegetation seen. If high clinical suspicion for endocarditis, consider TEE. FINDINGS  Left Ventricle: Left ventricular ejection fraction, by estimation, is 55 to 60%. The left ventricle has normal function. The left ventricle has no regional wall  motion abnormalities. The left ventricular internal cavity size was normal in size. There is  no left ventricular hypertrophy. Left ventricular diastolic parameters were normal. Right Ventricle: The right ventricular size is normal. Right vetricular wall thickness was not assessed. Right ventricular systolic function is normal. Tricuspid regurgitation signal is inadequate for assessing PA pressure. Left Atrium: Left atrial size was normal in size. Right Atrium: Right atrial size was normal in size. Pericardium: There is no evidence of pericardial effusion. Mitral Valve: The mitral valve is normal in structure. No evidence of mitral valve regurgitation. Tricuspid Valve: The tricuspid valve is normal in structure. Tricuspid valve regurgitation is trivial. Aortic Valve: The aortic valve is tricuspid. Aortic valve regurgitation is not visualized. No aortic stenosis is present. Pulmonic Valve: The pulmonic valve was not well visualized. Pulmonic valve regurgitation is not visualized. Aorta: The aortic root and ascending aorta are structurally normal, with no evidence of dilitation. Venous: The inferior vena cava is dilated in size with greater than 50% respiratory variability, suggesting right atrial pressure of 8 mmHg. IAS/Shunts: The interatrial septum was not well visualized.  LEFT VENTRICLE PLAX 2D LVIDd:         5.10 cm  Diastology LVIDs:         3.30 cm  LV e' lateral:   14.80 cm/s LV PW:         1.00 cm  LV E/e' lateral: 6.4 LV IVS:        1.00 cm  LV e' medial:    11.00 cm/s LVOT diam:     2.20 cm  LV E/e' medial:  8.6 LV SV:         87 LV SV Index:   44 LVOT Area:     3.80 cm  RIGHT VENTRICLE             IVC RV Basal diam:  3.10 cm     IVC diam: 2.10 cm RV Mid diam:    2.90 cm RV S prime:     17.00 cm/s TAPSE (M-mode): 2.1 cm LEFT ATRIUM             Index       RIGHT ATRIUM           Index LA diam:        3.80 cm 1.93 cm/m  RA Area:     16.50 cm LA Vol (A2C):   64.7 ml 32.81 ml/m RA Volume:   44.80 ml  22.72  ml/m LA Vol (A4C):  64.3 ml 32.60 ml/m LA Biplane Vol: 64.1 ml 32.50 ml/m  AORTIC VALVE LVOT Vmax:   117.00 cm/s LVOT Vmean:  79.000 cm/s LVOT VTI:    0.229 m  AORTA Ao Root diam: 3.10 cm Ao Asc diam:  2.40 cm MITRAL VALVE MV Area (PHT): 3.37 cm    SHUNTS MV Decel Time: 225 msec    Systemic VTI:  0.23 m MV E velocity: 94.60 cm/s  Systemic Diam: 2.20 cm MV A velocity: 53.60 cm/s MV E/A ratio:  1.76 Oswaldo Milian MD Electronically signed by Oswaldo Milian MD Signature Date/Time: 12/16/2019/8:32:25 PM    Final    ECHO TEE  Result Date: 12/23/2019    TRANSESOPHOGEAL ECHO REPORT   Patient Name:   Jordan [name] Date of Exam: 12/23/2019 Medical Rec #:  [id number]      Height:       70.0 in Accession #:    [id number]     Weight:       169.5 lb Date of Birth:  [birth year]       BSA:          1.946 m Patient Age:    29 years       BP:           111/49 mmHg Patient Gender: M              HR:           69 bpm. Exam Location:  Inpatient Procedure: Transesophageal Echo Indications:    bacteremia  History:        Patient has prior history of Echocardiogram examinations, most                 recent 12/16/2019. Risk Factors:Current Smoker. Polysubstance                 abuse.  Sonographer:    Jannett Celestine RDCS (AE) Referring Phys: [place]: The transesophogeal probe was passed without difficulty through the esophogus of the patient. Sedation performed by different physician. The patient's vital signs; including heart rate, blood pressure, and oxygen saturation; remained stable throughout the procedure. The patient developed no complications during the procedure. IMPRESSIONS  1. Left ventricular ejection fraction, by estimation, is 60 to 65%. The left ventricle has normal function. The left ventricle has no regional wall motion abnormalities.  2. Right ventricular systolic function is normal. The right ventricular size is normal.  3. No left atrial/left atrial appendage thrombus was detected.  4.  The mitral valve is normal in structure. No evidence of mitral valve regurgitation. No evidence of mitral stenosis.  5. The aortic valve is normal in structure. Aortic valve regurgitation is not visualized. No aortic stenosis is present.  6. The inferior vena cava is normal in size with greater than 50% respiratory variability, suggesting right atrial pressure of 3 mmHg. Conclusion(s)/Recommendation(s): Normal biventricular function without evidence of hemodynamically significant valvular heart disease. No evidence of vegetation/infective endocarditis on this transesophageal echocardiogram. FINDINGS  Left Ventricle: Left ventricular ejection fraction, by estimation, is 60 to 65%. The left ventricle has normal function. The left ventricle has no regional wall motion abnormalities. The left ventricular internal cavity size was normal in size. There is  no left ventricular hypertrophy. Right Ventricle: The right ventricular size is normal. No increase in right ventricular wall thickness. Right ventricular systolic function is normal. Left Atrium: Left atrial size was normal in size. No left atrial/left atrial appendage thrombus was detected. Right Atrium: Right  atrial size was normal in size. Pericardium: There is no evidence of pericardial effusion. Mitral Valve: The mitral valve is normal in structure. Normal mobility of the mitral valve leaflets. No evidence of mitral valve regurgitation. No evidence of mitral valve stenosis. Tricuspid Valve: The tricuspid valve is normal in structure. Tricuspid valve regurgitation is not demonstrated. No evidence of tricuspid stenosis. Aortic Valve: The aortic valve is normal in structure. Aortic valve regurgitation is not visualized. No aortic stenosis is present. Pulmonic Valve: The pulmonic valve was normal in structure. Pulmonic valve regurgitation is trivial. No evidence of pulmonic stenosis. Aorta: The aortic root is normal in size and structure. Venous: The inferior vena  cava is normal in size with greater than 50% respiratory variability, suggesting right atrial pressure of 3 mmHg. IAS/Shunts: No atrial level shunt detected by color flow Doppler. Sanda Klein MD Electronically signed by Sanda Klein MD Signature Date/Time: 12/23/2019/1:52:04 PM    Final      Microbiology: Recent Results (from the past 240 hour(s))  Blood culture (routine x 2)     Status: None   Collection Time: 12/15/19  3:53 PM   Specimen: BLOOD  Result Value Ref Range Status   Specimen Description BLOOD LEFT UPPER ARM  Final   Special Requests   Final    BOTTLES DRAWN AEROBIC AND ANAEROBIC Blood Culture adequate volume   Culture   Final    NO GROWTH 5 DAYS Performed at [facility], [address], [place], [place] [address]    Report Status 12/20/2019 FINAL  Final  Blood culture (routine x 2)     Status: None   Collection Time: 12/15/19  4:35 PM   Specimen: BLOOD LEFT HAND  Result Value Ref Range Status   Specimen Description BLOOD LEFT HAND  Final   Special Requests   Final    BOTTLES DRAWN AEROBIC AND ANAEROBIC Blood Culture adequate volume   Culture   Final    NO GROWTH 5 DAYS Performed at [facility], [place] [address] [place], [place] [address]    Report Status 12/20/2019 FINAL  Final  SARS Coronavirus 2 by RT PCR (hospital order, performed in [facility] hospital lab) Nasopharyngeal Nasopharyngeal Swab     Status: None   Collection Time: 12/15/19  4:46 PM   Specimen: Nasopharyngeal Swab  Result Value Ref Range Status   SARS Coronavirus 2 NEGATIVE NEGATIVE Final    Comment: (NOTE) SARS-CoV-2 target nucleic acids are NOT DETECTED.  The SARS-CoV-2 RNA is generally detectable in upper and lower respiratory specimens during the acute phase of infection. The lowest concentration of SARS-CoV-2 viral copies this assay can detect is 250 copies / mL. A negative result does not preclude SARS-CoV-2 infection and should not be used as the sole basis for treatment  or other patient management decisions.  A negative result may occur with improper specimen collection / handling, submission of specimen other than nasopharyngeal swab, presence of viral mutation(s) within the areas targeted by this assay, and inadequate number of viral copies (<250 copies / mL). A negative result must be combined with clinical observations, patient history, and epidemiological information.  Fact Sheet for Patients:   StrictlyIdeas.no  Fact Sheet for Healthcare Providers: BankingDealers.co.za  This test is not yet approved or  cleared by the [place] FDA and has been authorized for detection and/or diagnosis of SARS-CoV-2 by FDA under an Emergency Use Authorization (EUA).  This EUA will remain in effect (meaning this test can be  used) for the duration of the COVID-19 declaration under Section 564(b)(1) of the Act, 21 U.S.C. section 360bbb-3(b)(1), unless the authorization is terminated or revoked sooner.  Performed at [facility], [place] [address], [place], [place] [address]   Aerobic/Anaerobic Culture (surgical/deep wound)     Status: None   Collection Time: 12/16/19  9:02 AM   Specimen: Wound; Abscess  Result Value Ref Range Status   Specimen Description WOUND  Final   Special Requests LEFT ILIACUS MUSCLE ABCESS  Final   Gram Stain   Final    ABUNDANT WBC PRESENT, PREDOMINANTLY PMN FEW GRAM POSITIVE COCCI IN PAIRS    Culture   Final    MODERATE METHICILLIN RESISTANT STAPHYLOCOCCUS AUREUS NO ANAEROBES ISOLATED Performed at [facility], [address] [place], [place] [address]    Report Status 12/22/2019 FINAL  Final   Organism ID, Bacteria METHICILLIN RESISTANT STAPHYLOCOCCUS AUREUS  Final      Susceptibility   Methicillin resistant staphylococcus aureus - MIC*    CIPROFLOXACIN >=8 RESISTANT Resistant     ERYTHROMYCIN >=8 RESISTANT Resistant     GENTAMICIN <=0.5 SENSITIVE Sensitive      OXACILLIN >=4 RESISTANT Resistant     TETRACYCLINE <=1 SENSITIVE Sensitive     VANCOMYCIN <=0.5 SENSITIVE Sensitive     TRIMETH/SULFA >=320 RESISTANT Resistant     CLINDAMYCIN >=8 RESISTANT Resistant     RIFAMPIN <=0.5 SENSITIVE Sensitive     Inducible Clindamycin NEGATIVE Sensitive     * MODERATE METHICILLIN RESISTANT STAPHYLOCOCCUS AUREUS     Labs: Basic Metabolic Panel: Recent Labs  Lab 12/19/19 0704 12/23/19 0450  NA 139 137  K 4.0 4.0  CL 105 102  CO2 26 23  GLUCOSE 82 96  BUN 16 19  CREATININE 0.76 0.76  CALCIUM 9.0 9.4   Liver Function Tests: No results for input(s): AST, ALT, ALKPHOS, BILITOT, PROT, ALBUMIN in the last 168 hours. No results for input(s): LIPASE, AMYLASE in the last 168 hours. No results for input(s): AMMONIA in the last 168 hours. CBC: Recent Labs  Lab 12/19/19 0704  WBC 12.0*  HGB 10.4*  HCT 33.6*  MCV 83.2  PLT 489*   Cardiac Enzymes: No results for input(s): CKTOTAL, CKMB, CKMBINDEX, TROPONINI in the last 168 hours. BNP: Invalid input(s): POCBNP CBG: No results for input(s): GLUCAP in the last 168 hours.  Time coordinating discharge:  36 minutes  Signed:  Orson Eva, DO [facility] Pager: [phone] 12/24/2019, 5:26 PM

## 2019-12-24 NOTE — Progress Notes (Signed)
Went to patients room to discuss UA result. Pt tested positive for Amphetamines , which was negative on admission. Leadership, bedside nurse Ulice Dash) , spoke with patient at bedside. Patient is agreeable to only have 1 visitors for the remainder of his stay, he has chosen Lovena Le has his visitor. Pt has also agreed to have daily room inspections, if patient leaves floor he is to have staff with him, and visitor screening. This RN verified with [facility] and [facility] stated action plan was appropriate. Charge nurse and patient's chart was updated.

## 2019-12-24 NOTE — Progress Notes (Addendum)
PROGRESS NOTE    Tiler Brandis  BLT:[id number] DOB: [birth year] DOA: 12/15/2019 PCP: Patient, No Pcp Per   Brief Narrative: 29 year old with past medical history significant for IV drug use, hospitalized 7/19-7/27 for MRSA bacteremia, TEE negative, treated with vancomycin and then discharged on Zyvox. -Readmitted with fever and worsening low back pain on 8/13 growing MRSA bacteremia and left AMA.  MRI at that time was consistent with osteomyelitis of the left SI joint.  -He was found passed out in front of the [facility], unresponsive.  Was using fentanyl prior to that.  He was given Narcan and brought to the ER. -Restarted on IV vancomycin for recent MRSA bacteremia and osteomyelitis/iliacus abscess, aspiration of SI joint grew MRSA -Infectious disease following, TEE on 8/24 -.    Assessment & Plan:   Principal Problem:   Acute osteomyelitis, pelvis (HCC) Active Problems:   Polysubstance abuse ([place])   Bacteremia   MRSA bacteremia   Drug overdose   Leukocytosis   Thrombocytosis (HCC)   Abscess   1-MRSA Bacteremia/IV drug abuse Left SI joint septic arthritis/Osteomyelitis, Iliopsoas Abscess;  -Blood cultures from recent hospitalization with MRSA, Aspiration from left iliac muscle abscess growing MRSA.  -TEE 7/26 negative -repeated Blood culture negative.  -ID recommended total 2 weeks of IV vancomycin until 8/31 y=the a dose of Oritavancin on September 1.   2-IV drug use, high risk of opiate withdrawal Uses large amount of Fentanyl, cocaine, methamphetamine, benzodiazepine.  -He was started on Opiate protocol with clonidine, baclofen, NSAI.  -8-21 --22 overnight snorted Amphetamines inpatient, brought by a friend.  -Continue oxycodone. Change to evey 6 hours PRN on 8/24 -    Estimated body mass index is 24.33 kg/m as calculated from the following:   Height as of this encounter: 5\' 10"  (1.778 m).   Weight as of this encounter: 76.9 kg.   DVT prophylaxis: Lovenox Code Status:  Full code.  Family Communication: Care discussed with Patient  Disposition Plan:  Status is: Inpatient  Remains inpatient appropriate because:Unsafe d/c plan   Dispo: The patient is from: Home              Anticipated d/c is to: Home              Anticipated d/c date is: > 3 days              Patient currently is not medically stable to d/c.        Consultants:   ID  Procedures:   None  Antimicrobials:  Vancomycin   Subjective: Alert, complaining of pain.  Would like suboxone  Objective: Vitals:   12/23/19 0905 12/23/19 0920 12/24/19 0500 12/24/19 1003  BP: (!) 112/52 110/64 112/68 106/76  Pulse: 81 61 68 81  Resp: 19 14 18 18   Temp:  97.8 F (36.6 C) 97.8 F (36.6 C) 98.3 F (36.8 C)  TempSrc:  Oral Oral Oral  SpO2: 100% 100% 98% 100%  Weight:      Height:        Intake/Output Summary (Last 24 hours) at 12/24/2019 1541 Last data filed at 12/24/2019 0500 Gross per 24 hour  Intake 780 ml  Output 0 ml  Net 780 ml   Filed Weights   12/16/19 1922 12/17/19 2122 12/22/19 2105  Weight: 79.4 kg 79.4 kg 76.9 kg    Examination:  General exam: Appears calm and comfortable  Respiratory system: Clear to auscultation. Respiratory effort normal. Cardiovascular system: S1 & S2 heard, RRR. No JVD, murmurs,  rubs, gallops or clicks. No pedal edema. Gastrointestinal system: Abdomen is nondistended, soft and nontender. No organomegaly or masses felt. Normal bowel sounds heard. Central nervous system: Alert and oriented. No focal neurological deficits. Extremities: Symmetric 5 x 5 power.   Data Reviewed: I have personally reviewed following labs and imaging studies  CBC: Recent Labs  Lab 12/19/19 0704  WBC 12.0*  HGB 10.4*  HCT 33.6*  MCV 83.2  PLT 643*   Basic Metabolic Panel: Recent Labs  Lab 12/19/19 0704 12/23/19 0450  NA 139 137  K 4.0 4.0  CL 105 102  CO2 26 23  GLUCOSE 82 96  BUN 16 19  CREATININE 0.76 0.76  CALCIUM 9.0 9.4    GFR: Estimated Creatinine Clearance: 141.9 mL/min (by C-G formula based on SCr of 0.76 mg/dL). Liver Function Tests: No results for input(s): AST, ALT, ALKPHOS, BILITOT, PROT, ALBUMIN in the last 168 hours. No results for input(s): LIPASE, AMYLASE in the last 168 hours. No results for input(s): AMMONIA in the last 168 hours. Coagulation Profile: Recent Labs  Lab 12/23/19 0450  INR 1.1   Cardiac Enzymes: No results for input(s): CKTOTAL, CKMB, CKMBINDEX, TROPONINI in the last 168 hours. BNP (last 3 results) No results for input(s): PROBNP in the last 8760 hours. HbA1C: No results for input(s): HGBA1C in the last 72 hours. CBG: No results for input(s): GLUCAP in the last 168 hours. Lipid Profile: No results for input(s): CHOL, HDL, LDLCALC, TRIG, CHOLHDL, LDLDIRECT in the last 72 hours. Thyroid Function Tests: No results for input(s): TSH, T4TOTAL, FREET4, T3FREE, THYROIDAB in the last 72 hours. Anemia Panel: No results for input(s): VITAMINB12, FOLATE, FERRITIN, TIBC, IRON, RETICCTPCT in the last 72 hours. Sepsis Labs: No results for input(s): PROCALCITON, LATICACIDVEN in the last 168 hours.  Recent Results (from the past 240 hour(s))  Blood culture (routine x 2)     Status: None   Collection Time: 12/15/19  3:53 PM   Specimen: BLOOD  Result Value Ref Range Status   Specimen Description BLOOD LEFT UPPER ARM  Final   Special Requests   Final    BOTTLES DRAWN AEROBIC AND ANAEROBIC Blood Culture adequate volume   Culture   Final    NO GROWTH 5 DAYS Performed at [facility], [address] [place], [place] [address]    Report Status 12/20/2019 FINAL  Final  Blood culture (routine x 2)     Status: None   Collection Time: 12/15/19  4:35 PM   Specimen: BLOOD LEFT HAND  Result Value Ref Range Status   Specimen Description BLOOD LEFT HAND  Final   Special Requests   Final    BOTTLES DRAWN AEROBIC AND ANAEROBIC Blood Culture adequate volume   Culture   Final    NO  GROWTH 5 DAYS Performed at [facility], [place] [address] [place], [place] [address]    Report Status 12/20/2019 FINAL  Final  SARS Coronavirus 2 by RT PCR (hospital order, performed in [facility] hospital lab) Nasopharyngeal Nasopharyngeal Swab     Status: None   Collection Time: 12/15/19  4:46 PM   Specimen: Nasopharyngeal Swab  Result Value Ref Range Status   SARS Coronavirus 2 NEGATIVE NEGATIVE Final    Comment: (NOTE) SARS-CoV-2 target nucleic acids are NOT DETECTED.  The SARS-CoV-2 RNA is generally detectable in upper and lower respiratory specimens during the acute phase of infection. The lowest concentration of SARS-CoV-2 viral copies this assay can detect is 250 copies /  mL. A negative result does not preclude SARS-CoV-2 infection and should not be used as the sole basis for treatment or other patient management decisions.  A negative result may occur with improper specimen collection / handling, submission of specimen other than nasopharyngeal swab, presence of viral mutation(s) within the areas targeted by this assay, and inadequate number of viral copies (<250 copies / mL). A negative result must be combined with clinical observations, patient history, and epidemiological information.  Fact Sheet for Patients:   StrictlyIdeas.no  Fact Sheet for Healthcare Providers: BankingDealers.co.za  This test is not yet approved or  cleared by the [place] FDA and has been authorized for detection and/or diagnosis of SARS-CoV-2 by FDA under an Emergency Use Authorization (EUA).  This EUA will remain in effect (meaning this test can be used) for the duration of the COVID-19 declaration under Section 564(b)(1) of the Act, 21 U.S.C. section 360bbb-3(b)(1), unless the authorization is terminated or revoked sooner.  Performed at [facility], [place] [address] [place], [place] [address]   Aerobic/Anaerobic Culture  (surgical/deep wound)     Status: None   Collection Time: 12/16/19  9:02 AM   Specimen: Wound; Abscess  Result Value Ref Range Status   Specimen Description WOUND  Final   Special Requests LEFT ILIACUS MUSCLE ABCESS  Final   Gram Stain   Final    ABUNDANT WBC PRESENT, PREDOMINANTLY PMN FEW GRAM POSITIVE COCCI IN PAIRS    Culture   Final    MODERATE METHICILLIN RESISTANT STAPHYLOCOCCUS AUREUS NO ANAEROBES ISOLATED Performed at [facility], [address], [place], [place] [address]    Report Status 12/22/2019 FINAL  Final   Organism ID, Bacteria METHICILLIN RESISTANT STAPHYLOCOCCUS AUREUS  Final      Susceptibility   Methicillin resistant staphylococcus aureus - MIC*    CIPROFLOXACIN >=8 RESISTANT Resistant     ERYTHROMYCIN >=8 RESISTANT Resistant     GENTAMICIN <=0.5 SENSITIVE Sensitive     OXACILLIN >=4 RESISTANT Resistant     TETRACYCLINE <=1 SENSITIVE Sensitive     VANCOMYCIN <=0.5 SENSITIVE Sensitive     TRIMETH/SULFA >=320 RESISTANT Resistant     CLINDAMYCIN >=8 RESISTANT Resistant     RIFAMPIN <=0.5 SENSITIVE Sensitive     Inducible Clindamycin NEGATIVE Sensitive     * MODERATE METHICILLIN RESISTANT STAPHYLOCOCCUS AUREUS         Radiology Studies: ECHO TEE  Result Date: 12/23/2019    TRANSESOPHOGEAL ECHO REPORT   Patient Name:   WILGUS DEYTON Date of Exam: 12/23/2019 Medical Rec #:  [id number]      Height:       70.0 in Accession #:    [id number]     Weight:       169.5 lb Date of Birth:  07-02-1990       BSA:          1.946 m Patient Age:    29 years       BP:           111/49 mmHg Patient Gender: M              HR:           69 bpm. Exam Location:  Inpatient Procedure: Transesophageal Echo Indications:    bacteremia  History:        Patient has prior history of Echocardiogram examinations, most                 recent 12/16/2019.  Risk Factors:Current Smoker. Polysubstance                 abuse.  Sonographer:    Jannett Celestine RDCS (AE) Referring Phys: [place]: The transesophogeal probe was passed without difficulty through the esophogus of the patient. Sedation performed by different physician. The patient's vital signs; including heart rate, blood pressure, and oxygen saturation; remained stable throughout the procedure. The patient developed no complications during the procedure. IMPRESSIONS  1. Left ventricular ejection fraction, by estimation, is 60 to 65%. The left ventricle has normal function. The left ventricle has no regional wall motion abnormalities.  2. Right ventricular systolic function is normal. The right ventricular size is normal.  3. No left atrial/left atrial appendage thrombus was detected.  4. The mitral valve is normal in structure. No evidence of mitral valve regurgitation. No evidence of mitral stenosis.  5. The aortic valve is normal in structure. Aortic valve regurgitation is not visualized. No aortic stenosis is present.  6. The inferior vena cava is normal in size with greater than 50% respiratory variability, suggesting right atrial pressure of 3 mmHg. Conclusion(s)/Recommendation(s): Normal biventricular function without evidence of hemodynamically significant valvular heart disease. No evidence of vegetation/infective endocarditis on this transesophageal echocardiogram. FINDINGS  Left Ventricle: Left ventricular ejection fraction, by estimation, is 60 to 65%. The left ventricle has normal function. The left ventricle has no regional wall motion abnormalities. The left ventricular internal cavity size was normal in size. There is  no left ventricular hypertrophy. Right Ventricle: The right ventricular size is normal. No increase in right ventricular wall thickness. Right ventricular systolic function is normal. Left Atrium: Left atrial size was normal in size. No left atrial/left atrial appendage thrombus was detected. Right Atrium: Right atrial size was normal in size. Pericardium: There is no evidence of pericardial  effusion. Mitral Valve: The mitral valve is normal in structure. Normal mobility of the mitral valve leaflets. No evidence of mitral valve regurgitation. No evidence of mitral valve stenosis. Tricuspid Valve: The tricuspid valve is normal in structure. Tricuspid valve regurgitation is not demonstrated. No evidence of tricuspid stenosis. Aortic Valve: The aortic valve is normal in structure. Aortic valve regurgitation is not visualized. No aortic stenosis is present. Pulmonic Valve: The pulmonic valve was normal in structure. Pulmonic valve regurgitation is trivial. No evidence of pulmonic stenosis. Aorta: The aortic root is normal in size and structure. Venous: The inferior vena cava is normal in size with greater than 50% respiratory variability, suggesting right atrial pressure of 3 mmHg. IAS/Shunts: No atrial level shunt detected by color flow Doppler. Mihai Croitoru MD Electronically signed by Sanda Klein MD Signature Date/Time: 12/23/2019/1:52:04 PM    Final         Scheduled Meds: . busPIRone  5 mg Oral BID  . enoxaparin (LOVENOX) injection  40 mg Subcutaneous Q24H  . nicotine  21 mg Transdermal Daily  . sodium chloride flush  10-40 mL Intracatheter Q12H   Continuous Infusions: . vancomycin 1,500 mg (12/24/19 0544)     LOS: 9 days    Time spent: 35 minutes.     Elmarie Shiley, MD [facility]   If 7PM-7AM, please contact night-coverage [email or web address]  12/24/2019, 3:41 PM

## 2019-12-24 NOTE — Progress Notes (Signed)
[facility] for Infectious Disease    Date of Admission:  12/15/2019   Total days of antibiotics 9           ID: Jordan [name] is a 29 y.o. male with MRSA bacteremia  Principal Problem:   Acute osteomyelitis, pelvis ([place]) Active Problems:   Polysubstance abuse ([place])   Bacteremia   MRSA bacteremia   Drug overdose   Leukocytosis   Thrombocytosis (HCC)    Subjective: Tolerating iv vancomycin, having more back pain this morning. Afebrile. No rash nor diarrhea  12: point ros otherwise is negative Medications:  . busPIRone  5 mg Oral BID  . enoxaparin (LOVENOX) injection  40 mg Subcutaneous Q24H  . nicotine  21 mg Transdermal Daily  . sodium chloride flush  10-40 mL Intracatheter Q12H    Objective: Vital signs in last 24 hours: Temp:  [97.8 F (36.6 C)-98.3 F (36.8 C)] 98.3 F (36.8 C) (08/25 1003) Pulse Rate:  [68-81] 81 (08/25 1003) Resp:  [18] 18 (08/25 1003) BP: (106-112)/(68-76) 106/76 (08/25 1003) SpO2:  [98 %-100 %] 100 % (08/25 1003) Physical Exam  Constitutional: He is oriented to person, place, and time. He appears well-developed and well-nourished. No distress.  HENT:  Mouth/Throat: Oropharynx is clear and moist. No oropharyngeal exudate.  Cardiovascular: Normal rate, regular rhythm and normal heart sounds. Exam reveals no gallop and no friction rub.  No murmur heard.  Pulmonary/Chest: Effort normal and breath sounds normal. No respiratory distress. He has no wheezes.  Abdominal: Soft. Bowel sounds are normal. He exhibits no distension. There is no tenderness.  Lymphadenopathy:  He has no cervical adenopathy.  Neurological: He is alert and oriented to person, place, and time.  Skin: Skin is warm and dry. No rash noted. No erythema.  Psychiatric: He has a normal mood and affect. His behavior is normal.    Lab Results Recent Labs    12/23/19 0450  NA 137  K 4.0  CL 102  CO2 23  BUN 19  CREATININE 0.76   Lab Results  Component Value Date     ESRSEDRATE 89 (H) 12/12/2019    Microbiology: 8/17 aspirate of abscess = MRSA Studies/Results: ECHO TEE  Result Date: 12/23/2019    TRANSESOPHOGEAL ECHO REPORT   Patient Name:   Jordan [name] Date of Exam: 12/23/2019 Medical Rec #:  [id number]      Height:       70.0 in Accession #:    [id number]     Weight:       169.5 lb Date of Birth:  04-May-1990       BSA:          1.946 m Patient Age:    29 years       BP:           111/49 mmHg Patient Gender: M              HR:           69 bpm. Exam Location:  Inpatient Procedure: Transesophageal Echo Indications:    bacteremia  History:        Patient has prior history of Echocardiogram examinations, most                 recent 12/16/2019. Risk Factors:Current Smoker. Polysubstance                 abuse.  Sonographer:    Jannett Celestine RDCS (AE) Referring Phys: [place]:  The transesophogeal probe was passed without difficulty through the esophogus of the patient. Sedation performed by different physician. The patient's vital signs; including heart rate, blood pressure, and oxygen saturation; remained stable throughout the procedure. The patient developed no complications during the procedure. IMPRESSIONS  1. Left ventricular ejection fraction, by estimation, is 60 to 65%. The left ventricle has normal function. The left ventricle has no regional wall motion abnormalities.  2. Right ventricular systolic function is normal. The right ventricular size is normal.  3. No left atrial/left atrial appendage thrombus was detected.  4. The mitral valve is normal in structure. No evidence of mitral valve regurgitation. No evidence of mitral stenosis.  5. The aortic valve is normal in structure. Aortic valve regurgitation is not visualized. No aortic stenosis is present.  6. The inferior vena cava is normal in size with greater than 50% respiratory variability, suggesting right atrial pressure of 3 mmHg. Conclusion(s)/Recommendation(s): Normal biventricular  function without evidence of hemodynamically significant valvular heart disease. No evidence of vegetation/infective endocarditis on this transesophageal echocardiogram. FINDINGS  Left Ventricle: Left ventricular ejection fraction, by estimation, is 60 to 65%. The left ventricle has normal function. The left ventricle has no regional wall motion abnormalities. The left ventricular internal cavity size was normal in size. There is  no left ventricular hypertrophy. Right Ventricle: The right ventricular size is normal. No increase in right ventricular wall thickness. Right ventricular systolic function is normal. Left Atrium: Left atrial size was normal in size. No left atrial/left atrial appendage thrombus was detected. Right Atrium: Right atrial size was normal in size. Pericardium: There is no evidence of pericardial effusion. Mitral Valve: The mitral valve is normal in structure. Normal mobility of the mitral valve leaflets. No evidence of mitral valve regurgitation. No evidence of mitral valve stenosis. Tricuspid Valve: The tricuspid valve is normal in structure. Tricuspid valve regurgitation is not demonstrated. No evidence of tricuspid stenosis. Aortic Valve: The aortic valve is normal in structure. Aortic valve regurgitation is not visualized. No aortic stenosis is present. Pulmonic Valve: The pulmonic valve was normal in structure. Pulmonic valve regurgitation is trivial. No evidence of pulmonic stenosis. Aorta: The aortic root is normal in size and structure. Venous: The inferior vena cava is normal in size with greater than 50% respiratory variability, suggesting right atrial pressure of 3 mmHg. IAS/Shunts: No atrial level shunt detected by color flow Doppler. Sanda Klein MD Electronically signed by Sanda Klein MD Signature Date/Time: 12/23/2019/1:52:04 PM    Final      Assessment/Plan: Complicated mrsa bacteremia with pelvic abscess/concern for left SI joint septic arthritis/osteomyelitis per  imaging.endocarditis ruled out by TEE yesterday  - currently day 7 from last positive culture. We will plan to treat for additional 7 days with IV vancomycin then transition to a dose of oritavancin on sept 1st. Then can be discharged. We will have him follow up in the ID clinic on sept 9-10th for oral abtx with doxycycline 100mg  po bid. Can give rx on discharge for him to fill and start taking on sept 92EQ  Hx of illicit drug use = recommend to consider rehab   Will sign off.  [facility] for Infectious Diseases Cell: [phone] Pager: [phone]  12/24/2019, 11:39 AM

## 2019-12-25 LAB — VANCOMYCIN, TROUGH: Vancomycin Tr: <4 ug/mL — ABNORMAL LOW (ref 15–20)

## 2019-12-25 MED ORDER — SODIUM CHLORIDE 0.9 % IV SOLN
INTRAVENOUS | Status: DC | PRN
  Administered 2019-12-25: 500 mL via INTRAVENOUS
  Administered 2019-12-26: 250 mL via INTRAVENOUS

## 2019-12-25 MED ORDER — VANCOMYCIN HCL 1250 MG/250ML IV SOLN
1250.0000 mg | Freq: Three times a day (TID) | INTRAVENOUS | Status: DC
  Administered 2019-12-25 – 2019-12-26 (×3): 1250 mg via INTRAVENOUS
  Filled 2019-12-25 (×4): qty 250

## 2019-12-25 MED ORDER — LACTATED RINGERS IV BOLUS
500.0000 mL | Freq: Once | INTRAVENOUS | Status: AC
  Administered 2019-12-25: 500 mL via INTRAVENOUS

## 2019-12-25 NOTE — Progress Notes (Signed)
Went into patient's room,to hang his antibiotic and to give his pain medicine as he requested,but the patient  was in the bathroom.I keep on coming on him for three times ,he was still in the bathroom.spoke to him to ask him ,if he was alright ,and he said that he was and he was moving his bowel,which I believed ,it smelled pooh in there.Patient stayed in the bathroom for almost an hour.

## 2019-12-25 NOTE — Progress Notes (Addendum)
N.T called me to have a private talk with me after she came out from patient's room,so me and her went to walked at the far end of the hallway to have a private conversation.N.T said "I can no longer takes care of that patient ,she said to me something that was not so nice and  I think" ,at this moment the patient approached both of [place] and the patient confronted the N.T and the patient said to her" what you are saying to my nurse,why you are trying me to look bad here.At this moment ,the nurse intervene nd ask the patient that ''please don't say that ,we' are all here to help you. Also nurse instructed the N.T not to say anything.Patient walked back into his room while saying these words '' you have to much intrusion here.'. and he closed the door.Went to my Camera operator and reported this incident.

## 2019-12-25 NOTE — Progress Notes (Signed)
NT enters patient room to do his vitals and patient started yelling at NT, calling her a snitch and cursing at her, telling her that she should not have said anything to our AD about what took place this morning. NT states that her job is on the line when a patient is not doing what is expected and she stands by and allows it.

## 2019-12-25 NOTE — Progress Notes (Signed)
At around 1930 this RN and outgoing RN went to patient's room to do shift report and found patient sitting on the floor,very drowsy and diaphoretic but able to answer question appropriately. Patient was assisted back to bed because he was very unsteady. When asked if he fell, he stated, "no,I just want to sit on the floor." No signs of injury noted,patient denies any pain,able to move all extremities without difficulty. VS taken. MD,Opyd and Rapid response RN was called.

## 2019-12-25 NOTE — Significant Event (Signed)
Rapid Response Event Note   Reason for Call :  Called d/t lethargy.    Per RN, pt had been in bathroom for an extended amount of time(approx 1 hr). Once back in bed, RN noted increased HR,  informed MD, and started 500cc LR bolus. During shift change a few minutes later. Pt was found sitting on floor lethargic, diaphoretic, cool, and clammy. While getting pt back to bed, he had a very unsteady gait and, once back in bed, pt couldn't stay awake per staff.  At this time, RRT was called.   Per RN, this morning pt had a visitor and also went outside facility with NT(Per NT, he smoked a cigarette while he was outside).     Initial Focused Assessment:  Pt laying in bed with eyes closed. Pt will awaken to verbal command. Pt is alert and oriented x 4, c/o no SOB/pain.  When pt not spoken to,  pt goes back to sleep very quickly. Pupils pinpoint. Pt says he was looking for something on the floor when staff walked in. He stated that he did not fall. T-98.6, HR-109, 132/91, RR-16, SpO2-92% on RA.    Interventions:  No-RRT interventions needed at this time.   Plan of Care:  Pt's room to be searched (pt already agreed to daily room searches per notes) and visitors restricted per MD order.  Continue to monitor pt. Call RRT if further assistance needed.    Event Summary:   MD Notified: Dr. Myna Hidalgo notified and came to bedside Call [place] TMAU:[phone] End Time:2030  Dillard Essex, RN

## 2019-12-25 NOTE — Progress Notes (Signed)
Patient was looking at me at the station and waving at hand to me ,was telling me that he wants to talk to me privately .I asked my charge nurse to go with me, to listen with us,but the patient was taken a back and he said to me that " Hey men,i don't have any problem with you,why we need her.I explained to him ,that ,this is for the sake of conversational transparency .The patient said'' men you did not  get me,i just want you know that you people don't understand my side and that girl is not telling the truth.Men ,I don't have any trouble to you ,I just want you all to understand me.Nurse ,we do understand you especially on your difficult situation,but you need to understand [place] by following what we said for your good.Patient said ''I appreciate that ,I just want to talk to you ,just to apologize.The patient turn around ,went back into his room.

## 2019-12-25 NOTE — Progress Notes (Addendum)
Paged on call M.D ,requesting him to personally assessed the patient.Patient was highly suspicious ,that he might took something.Patient was lethargic ,conversant but very slow,aprrpriate response but show some irritation for our presence.he was able to moved around but the way he moved as if ,he was walking slow motion or walking on the moon.He is diaphoretic. Rapid response made aware.Nurse stayed with patient to assure his safety.Patient took his pee bottle to pee.Nurse told the patient to save his pee.Patient did not want me to follow him in the bathroom instead he bang the door closed,few minutes ,nurse heard the flushing water in the toilet bowl.

## 2019-12-25 NOTE — Progress Notes (Signed)
Patient has been in the bathroom for more than 30 minutes,RN knocked on the bathroom door but no answer.When RN opened the door patient was sitting on the toilet and  bent down on the floor.When RN called again,he sat up,appearing so drowsy,eyes glazed,pack of cigarette and lighter on the floor. Per patient, he was trying to move his bowels. Assisted patient back in the bed. Security was called. Cigarette and lighter taken by security. MD,Opyd made aware,order received for sitter. AC made aware of sitter need and circumstances why he needs a sitter,male sitter if possible. Patient then informed that he will have a sitter for his safety. Patient adamant that he is fine.

## 2019-12-25 NOTE — Progress Notes (Signed)
Patient refused bed alarm and always keeps the  door closed.Explained to patient that he is very unsteady on his feet and that he might fall. Patient becomes irritated,stating "I've been walking around with no problem and I already told you,I did not fall." Will continue to monitor.

## 2019-12-25 NOTE — Progress Notes (Signed)
PROGRESS NOTE    Jordan [name]  IRC:[id number] DOB: 12-27-1990 DOA: 12/15/2019 PCP: Patient, No Pcp Per   Brief Narrative: 29 year old with past medical history significant for IV drug use, hospitalized 7/19-7/27 for MRSA bacteremia, TEE negative, treated with vancomycin and then discharged on Zyvox. -Readmitted with fever and worsening low back pain on 8/13 growing MRSA bacteremia and left AMA.  MRI at that time was consistent with osteomyelitis of the left SI joint.  -He was found passed out in front of the [facility], unresponsive.  Was using fentanyl prior to that.  He was given Narcan and brought to the ER. -Restarted on IV vancomycin for recent MRSA bacteremia and osteomyelitis/iliacus abscess, aspiration of SI joint grew MRSA -Infectious disease following, TEE on 8/24 -.    Assessment & Plan:   Principal Problem:   Acute osteomyelitis, pelvis (HCC) Active Problems:   Polysubstance abuse ([place])   Bacteremia   MRSA bacteremia   Drug overdose   Leukocytosis   Thrombocytosis (HCC)   Abscess   1-MRSA Bacteremia/IV drug abuse Left SI joint septic arthritis/Osteomyelitis, Iliopsoas Abscess;  -Blood cultures from recent hospitalization with MRSA, Aspiration from left iliac muscle abscess growing MRSA.  -TEE 7/26 negative -repeated Blood culture negative.  -ID recommended total 2 weeks of IV vancomycin until 8/31 then a dose of Oritavancin on September 1.   2-IV drug use, high risk of opiate withdrawal Uses large amount of Fentanyl, cocaine, methamphetamine, benzodiazepine.  -He was started on Opiate protocol with clonidine, baclofen, NSAI.  -8-21 --22 overnight snorted Amphetamines inpatient, brought by a friend.  -Continue oxycodone. Change to evey 6 hours PRN on 8/24 -Will try to arrange suboxone     Estimated body mass index is 24.33 kg/m as calculated from the following:   Height as of this encounter: 5\' 10"  (1.778 m).   Weight as of this encounter: 76.9 kg.   DVT  prophylaxis: Lovenox Code Status: Full code.  Family Communication: Care discussed with Patient  Disposition Plan:  Status is: Inpatient  Remains inpatient appropriate because:Unsafe d/c plan   Dispo: The patient is from: Home              Anticipated d/c is to: Home              Anticipated d/c date is: > 3 days              Patient currently is not medically stable to d/c.        Consultants:   ID  Procedures:   None  Antimicrobials:  Vancomycin   Subjective: He is complaining of pain. He would like suboxone, but is asking also about oxycodone.   Objective: Vitals:   12/24/19 1003 12/24/19 1717 12/24/19 2134 12/25/19 0518  BP: 106/76 133/84 (!) 147/85 (!) 152/92  Pulse: 81 96 99 (!) 110  Resp: 18 18 18 18   Temp: 98.3 F (36.8 C) 99.1 F (37.3 C) 99 F (37.2 C) 99.5 F (37.5 C)  TempSrc: Oral Oral Oral Oral  SpO2: 100% 100% 100% 100%  Weight:      Height:        Intake/Output Summary (Last 24 hours) at 12/25/2019 0818 Last data filed at 12/25/2019 0600 Gross per 24 hour  Intake 720.17 ml  Output 150 ml  Net 570.17 ml   Filed Weights   12/16/19 1922 12/17/19 2122 12/22/19 2105  Weight: 79.4 kg 79.4 kg 76.9 kg    Examination:  General exam: NAD Respiratory system: CTA Cardiovascular  system: S 1, S 2 RRR Gastrointestinal system: BS present, soft, nt Central nervous system: Alert, following command Extremities: Symmetric power.    Data Reviewed: I have personally reviewed following labs and imaging studies  CBC: Recent Labs  Lab 12/19/19 0704  WBC 12.0*  HGB 10.4*  HCT 33.6*  MCV 83.2  PLT 342*   Basic Metabolic Panel: Recent Labs  Lab 12/19/19 0704 12/23/19 0450  NA 139 137  K 4.0 4.0  CL 105 102  CO2 26 23  GLUCOSE 82 96  BUN 16 19  CREATININE 0.76 0.76  CALCIUM 9.0 9.4   GFR: Estimated Creatinine Clearance: 141.9 mL/min (by C-G formula based on SCr of 0.76 mg/dL). Liver Function Tests: No results for input(s): AST,  ALT, ALKPHOS, BILITOT, PROT, ALBUMIN in the last 168 hours. No results for input(s): LIPASE, AMYLASE in the last 168 hours. No results for input(s): AMMONIA in the last 168 hours. Coagulation Profile: Recent Labs  Lab 12/23/19 0450  INR 1.1   Cardiac Enzymes: No results for input(s): CKTOTAL, CKMB, CKMBINDEX, TROPONINI in the last 168 hours. BNP (last 3 results) No results for input(s): PROBNP in the last 8760 hours. HbA1C: No results for input(s): HGBA1C in the last 72 hours. CBG: No results for input(s): GLUCAP in the last 168 hours. Lipid Profile: No results for input(s): CHOL, HDL, LDLCALC, TRIG, CHOLHDL, LDLDIRECT in the last 72 hours. Thyroid Function Tests: No results for input(s): TSH, T4TOTAL, FREET4, T3FREE, THYROIDAB in the last 72 hours. Anemia Panel: No results for input(s): VITAMINB12, FOLATE, FERRITIN, TIBC, IRON, RETICCTPCT in the last 72 hours. Sepsis Labs: No results for input(s): PROCALCITON, LATICACIDVEN in the last 168 hours.  Recent Results (from the past 240 hour(s))  Blood culture (routine x 2)     Status: None   Collection Time: 12/15/19  3:53 PM   Specimen: BLOOD  Result Value Ref Range Status   Specimen Description BLOOD LEFT UPPER ARM  Final   Special Requests   Final    BOTTLES DRAWN AEROBIC AND ANAEROBIC Blood Culture adequate volume   Culture   Final    NO GROWTH 5 DAYS Performed at [facility], [address] [place], [place] [address]    Report Status 12/20/2019 FINAL  Final  Blood culture (routine x 2)     Status: None   Collection Time: 12/15/19  4:35 PM   Specimen: BLOOD LEFT HAND  Result Value Ref Range Status   Specimen Description BLOOD LEFT HAND  Final   Special Requests   Final    BOTTLES DRAWN AEROBIC AND ANAEROBIC Blood Culture adequate volume   Culture   Final    NO GROWTH 5 DAYS Performed at [facility], [place] [address] [place], [place] [address]    Report Status 12/20/2019 FINAL  Final  SARS Coronavirus 2  by RT PCR (hospital order, performed in [facility] hospital lab) Nasopharyngeal Nasopharyngeal Swab     Status: None   Collection Time: 12/15/19  4:46 PM   Specimen: Nasopharyngeal Swab  Result Value Ref Range Status   SARS Coronavirus 2 NEGATIVE NEGATIVE Final    Comment: (NOTE) SARS-CoV-2 target nucleic acids are NOT DETECTED.  The SARS-CoV-2 RNA is generally detectable in upper and lower respiratory specimens during the acute phase of infection. The lowest concentration of SARS-CoV-2 viral copies this assay can detect is 250 copies / mL. A negative result does not preclude SARS-CoV-2 infection and should not be used as the sole  basis for treatment or other patient management decisions.  A negative result may occur with improper specimen collection / handling, submission of specimen other than nasopharyngeal swab, presence of viral mutation(s) within the areas targeted by this assay, and inadequate number of viral copies (<250 copies / mL). A negative result must be combined with clinical observations, patient history, and epidemiological information.  Fact Sheet for Patients:   StrictlyIdeas.no  Fact Sheet for Healthcare Providers: BankingDealers.co.za  This test is not yet approved or  cleared by the [place] FDA and has been authorized for detection and/or diagnosis of SARS-CoV-2 by FDA under an Emergency Use Authorization (EUA).  This EUA will remain in effect (meaning this test can be used) for the duration of the COVID-19 declaration under Section 564(b)(1) of the Act, 21 U.S.C. section 360bbb-3(b)(1), unless the authorization is terminated or revoked sooner.  Performed at [facility], [place] [address], [place], [place] [address]   Aerobic/Anaerobic Culture (surgical/deep wound)     Status: None   Collection Time: 12/16/19  9:02 AM   Specimen: Wound; Abscess  Result Value Ref Range Status   Specimen Description  WOUND  Final   Special Requests LEFT ILIACUS MUSCLE ABCESS  Final   Gram Stain   Final    ABUNDANT WBC PRESENT, PREDOMINANTLY PMN FEW GRAM POSITIVE COCCI IN PAIRS    Culture   Final    MODERATE METHICILLIN RESISTANT STAPHYLOCOCCUS AUREUS NO ANAEROBES ISOLATED Performed at [facility], [address], [place], [place] [address]    Report Status 12/22/2019 FINAL  Final   Organism ID, Bacteria METHICILLIN RESISTANT STAPHYLOCOCCUS AUREUS  Final      Susceptibility   Methicillin resistant staphylococcus aureus - MIC*    CIPROFLOXACIN >=8 RESISTANT Resistant     ERYTHROMYCIN >=8 RESISTANT Resistant     GENTAMICIN <=0.5 SENSITIVE Sensitive     OXACILLIN >=4 RESISTANT Resistant     TETRACYCLINE <=1 SENSITIVE Sensitive     VANCOMYCIN <=0.5 SENSITIVE Sensitive     TRIMETH/SULFA >=320 RESISTANT Resistant     CLINDAMYCIN >=8 RESISTANT Resistant     RIFAMPIN <=0.5 SENSITIVE Sensitive     Inducible Clindamycin NEGATIVE Sensitive     * MODERATE METHICILLIN RESISTANT STAPHYLOCOCCUS AUREUS         Radiology Studies: ECHO TEE  Result Date: 12/23/2019    TRANSESOPHOGEAL ECHO REPORT   Patient Name:   Jordan [name] Date of Exam: 12/23/2019 Medical Rec #:  [id number]      Height:       70.0 in Accession #:    [id number]     Weight:       169.5 lb Date of Birth:  1990-10-25       BSA:          1.946 m Patient Age:    29 years       BP:           111/49 mmHg Patient Gender: M              HR:           69 bpm. Exam Location:  Inpatient Procedure: Transesophageal Echo Indications:    bacteremia  History:        Patient has prior history of Echocardiogram examinations, most                 recent 12/16/2019. Risk Factors:Current Smoker. Polysubstance  abuse.  Sonographer:    Jannett Celestine RDCS (AE) Referring Phys: [place]: The transesophogeal probe was passed without difficulty through the esophogus of the patient. Sedation performed by different physician. The  patient's vital signs; including heart rate, blood pressure, and oxygen saturation; remained stable throughout the procedure. The patient developed no complications during the procedure. IMPRESSIONS  1. Left ventricular ejection fraction, by estimation, is 60 to 65%. The left ventricle has normal function. The left ventricle has no regional wall motion abnormalities.  2. Right ventricular systolic function is normal. The right ventricular size is normal.  3. No left atrial/left atrial appendage thrombus was detected.  4. The mitral valve is normal in structure. No evidence of mitral valve regurgitation. No evidence of mitral stenosis.  5. The aortic valve is normal in structure. Aortic valve regurgitation is not visualized. No aortic stenosis is present.  6. The inferior vena cava is normal in size with greater than 50% respiratory variability, suggesting right atrial pressure of 3 mmHg. Conclusion(s)/Recommendation(s): Normal biventricular function without evidence of hemodynamically significant valvular heart disease. No evidence of vegetation/infective endocarditis on this transesophageal echocardiogram. FINDINGS  Left Ventricle: Left ventricular ejection fraction, by estimation, is 60 to 65%. The left ventricle has normal function. The left ventricle has no regional wall motion abnormalities. The left ventricular internal cavity size was normal in size. There is  no left ventricular hypertrophy. Right Ventricle: The right ventricular size is normal. No increase in right ventricular wall thickness. Right ventricular systolic function is normal. Left Atrium: Left atrial size was normal in size. No left atrial/left atrial appendage thrombus was detected. Right Atrium: Right atrial size was normal in size. Pericardium: There is no evidence of pericardial effusion. Mitral Valve: The mitral valve is normal in structure. Normal mobility of the mitral valve leaflets. No evidence of mitral valve regurgitation. No  evidence of mitral valve stenosis. Tricuspid Valve: The tricuspid valve is normal in structure. Tricuspid valve regurgitation is not demonstrated. No evidence of tricuspid stenosis. Aortic Valve: The aortic valve is normal in structure. Aortic valve regurgitation is not visualized. No aortic stenosis is present. Pulmonic Valve: The pulmonic valve was normal in structure. Pulmonic valve regurgitation is trivial. No evidence of pulmonic stenosis. Aorta: The aortic root is normal in size and structure. Venous: The inferior vena cava is normal in size with greater than 50% respiratory variability, suggesting right atrial pressure of 3 mmHg. IAS/Shunts: No atrial level shunt detected by color flow Doppler. Mihai Croitoru MD Electronically signed by Sanda Klein MD Signature Date/Time: 12/23/2019/1:52:04 PM    Final         Scheduled Meds: . busPIRone  5 mg Oral BID  . enoxaparin (LOVENOX) injection  40 mg Subcutaneous Q24H  . nicotine  21 mg Transdermal Daily  . sodium chloride flush  10-40 mL Intracatheter Q12H   Continuous Infusions: . sodium chloride 500 mL (12/25/19 0554)  . vancomycin 1,500 mg (12/25/19 0555)     LOS: 10 days    Time spent: 35 minutes.     Elmarie Shiley, MD [facility]   If 7PM-7AM, please contact night-coverage [email or web address]  12/25/2019, 8:18 AM

## 2019-12-25 NOTE — Progress Notes (Signed)
Pharmacy Antibiotic Note  Jordan [name] is a 29 y.o. male admitted on 12/15/2019 with MRSA bacteremia and osteomyelitis of left sacroiliac joint.  Pharmacy has been consulted for vancomycin dosing. Tentative plan is to continue Vancomycin until 8/31, followed by dalbavancin and an oral course of zyvox.  Trough drawn today is <4 mcg/ml (subtherapeutic) and drawn appropriately.  Scr remains stable.  Plan: Will increase Vancomycin to 1250 q8h Goal vancomycin trough range:   15-20  mcg/mL Pharmacy will continue to monitor renal function, vancomycin troughs as clinically appropriate,  cultures and patient progress.   Height: 5\' 10"  (177.8 cm) Weight: 76.9 kg (169 lb 8.5 oz) IBW/kg (Calculated) : 73  Temp (24hrs), Avg:99 F (37.2 C), Min:98.3 F (36.8 C), Max:99.5 F (37.5 C)  Recent Labs  Lab 12/19/19 0704 12/21/19 0436 12/23/19 0450 12/25/19 0510  WBC 12.0*  --   --   --   CREATININE 0.76  --  0.76  --   VANCOTROUGH  --    < > 51* <4*   < > = values in this interval not displayed.    Estimated Creatinine Clearance: 141.9 mL/min (by C-G formula based on SCr of 0.76 mg/dL).    No Known Allergies  Antimicrobials this admission: vancomycin 8/13 >> 8/14 (left AMA), restarted 8/16 >>    Microbiology results: BCx 8/13: MRSA Bcx 8/14: negative Bcx 8/16: negative Muscle wound cx 8/17: mod MRSA 8/13 Resp PCR: SARS CoV-2 negative   Thank you for allowing pharmacy to be a part of this patient's care.  Norina Buzzard, PharmD PGY1 Pharmacy Resident 12/25/2019 8:46 AM

## 2019-12-26 LAB — CBC
HCT: 34.6 % — ABNORMAL LOW (ref 39.0–52.0)
Hemoglobin: 10.5 g/dL — ABNORMAL LOW (ref 13.0–17.0)
MCH: 25.1 pg — ABNORMAL LOW (ref 26.0–34.0)
MCHC: 30.3 g/dL (ref 30.0–36.0)
MCV: 82.8 fL (ref 80.0–100.0)
Platelets: 334 10*3/uL (ref 150–400)
RBC: 4.18 MIL/uL — ABNORMAL LOW (ref 4.22–5.81)
RDW: 15.7 % — ABNORMAL HIGH (ref 11.5–15.5)
WBC: 12.1 10*3/uL — ABNORMAL HIGH (ref 4.0–10.5)
nRBC: 0 % (ref 0.0–0.2)

## 2019-12-26 MED ORDER — OXYCODONE HCL 5 MG PO TABS: 5.0000 mg | ORAL_TABLET | Freq: Four times a day (QID) | ORAL | Status: DC | PRN

## 2019-12-26 MED ORDER — SODIUM CHLORIDE 0.9 % IV SOLN: INTRAVENOUS | Status: DC

## 2019-12-26 MED ORDER — SODIUM CHLORIDE 0.9 % IV BOLUS: 1000.0000 mL | Freq: Once | INTRAVENOUS | Status: DC

## 2019-12-26 MED ORDER — NALOXONE HCL 0.4 MG/ML IJ SOLN: 0.4000 mg | INTRAMUSCULAR | Status: DC | PRN

## 2019-12-26 NOTE — Discharge Summary (Signed)
Physician Discharge Summary  Jordan [name] TKZ:[id number] DOB: 1990-12-01 DOA: 12/15/2019  PCP: Patient, No Pcp Per  Admit date: 12/15/2019 Discharge date: 12/26/2019  Admitted From: Home  Disposition: Left AMA  Recommendations for Outpatient Follow-up:  1. Left AMA   Brief/Interim Summary: 1-MRSA Bacteremia/IV drug abuse Left SI joint septic arthritis/Osteomyelitis, Iliopsoas Abscess;  -Blood cultures from recent hospitalization with MRSA, Aspiration from left iliac muscle abscess growing MRSA.  -TEE 7/26 negative -Repeated Blood culture negative.  -ID recommended total 2 weeks of IV vancomycin until 8/31 y=the a dose of Oritavancin on September 1.   2-IV drug use, high risk of opiate withdrawal Uses large amount of Fentanyl, cocaine, methamphetamine, benzodiazepine.  -He was started on Opiate protocol with clonidine, baclofen, NSAI.  -8-21 --22 overnight snorted Amphetamines inpatient, brought by a friend.  -Events from last night notice. It seems patient use illicit drugs in the hospital (2-3 Times yesterday.) . A syringe was found in his IV. This morning he was notice to be falling sleep while eating breakfast.  -Plan;  -Will have security to search room. Security search room, and patient and found pills on patient.  -Narcan order PRN.  -Psych consulted.  -IV fluids, bolus.  -Person/Sitter at bedside.  -Explain to patient this behavior is not allows in the hospital.     Discharge Diagnoses:  Principal Problem:   Acute osteomyelitis, pelvis ([place]) Active Problems:   Polysubstance abuse ([place])   Bacteremia   MRSA bacteremia   Drug overdose   Leukocytosis   Thrombocytosis ([place])   Abscess    Discharge Instructions   Discharge medication:  Left [place], [facility]. Schedule an appointment as soon as possible for a visit.   Contact information: [address] [place] [address] [phone]        [facility] Follow up.   Contact information: [address], [place], [place] [address] [phone]             No Known Allergies  Consultations: ID  Procedures/Studies: CT PELVIS W CONTRAST  Result Date: 12/12/2019 CLINICAL DATA:  Left leg pain. Recent hospitalization for MRSA. Question osteomyelitis. EXAM: CT PELVIS WITH CONTRAST TECHNIQUE: Multidetector CT imaging of the pelvis was performed using the standard protocol following the bolus administration of intravenous contrast. CONTRAST:  158m OMNIPAQUE IOHEXOL 300 MG/ML  SOLN COMPARISON:  None. FINDINGS: Urinary Tract: Distal ureters and urinary bladder are within normal limits. Bowel:  Unremarkable visualized pelvic bowel loops. Vascular/Lymphatic: No pathologically enlarged lymph nodes. No significant vascular abnormality seen. Reproductive:  Prostate is unremarkable. Musculoskeletal: Osseous pelvis is unremarkable. The hips are located and within normal limits. Proximal femurs are normal bilaterally. No discrete soft tissue abscess or focal osteolysis is present. There is some motion artifact. IMPRESSION: 1. No osseous abnormality in the pelvis or proximal femurs. 2. Normal CT of the pelvis. Electronically Signed   By: CSan MorelleM.D.   On: 12/12/2019 04:52   MR PELVIS WO CONTRAST  Result Date: 12/12/2019 CLINICAL DATA:  Elevated ESR. MRSA bacteremia in July. Left posterior pelvic pain. Left buttock pain. EXAM: MRI PELVIS WITHOUT CONTRAST TECHNIQUE: Multiplanar multisequence MR imaging of the pelvis was performed. No intravenous contrast was administered. COMPARISON:  None. FINDINGS: Bones: No hip fracture, dislocation or avascular necrosis. No periosteal reaction or bone destruction. No aggressive osseous lesion. 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 Severe bone marrow edema centered around the left sacroiliac joint with adjacent soft  tissue edema with a left SI joint effusion which is decompressing anteriorly  where there is a focal 1.7 x 2.5 cm component deep to the iliopsoas muscle. Articular cartilage and labrum Articular cartilage:  No chondral defect. Labrum: Grossly intact, but evaluation is limited by lack of intraarticular fluid. Joint or bursal effusion Joint effusion:  No hip joint effusion.  No SI joint effusion. Bursae:  No bursa formation. Muscles and tendons Flexors: Normal. Extensors: Normal. Abductors: Normal. Adductors: Normal. Rotators: Muscle edema in the superior aspect of the left piriformis muscle adjacent to the inferior aspect of the left sacroiliac joint/which may be reactive versus secondary to infectious myositis. There is mild muscle edema in the left gluteus maximus muscle also contiguous with the posterior aspect of the left sacroiliac joint which may be again reactive or secondary to infectious myositis. No intramuscular fluid collection. Hamstrings: Normal. Other findings No pelvic free fluid. No fluid collection or hematoma. No inguinal lymphadenopathy. No inguinal hernia. IMPRESSION: 1. Severe bone marrow edema centered around the left sacroiliac joint with a left SI joint effusion which is decompressing anteriorly where there is a focal 1.7 x 2.5 cm component deep to the iliopsoas muscle. Findings are most consistent with osteomyelitis of the left sacroiliac joint. 2. Muscle edema in the superior aspect of the left piriformis muscle adjacent to the inferior aspect of the left sacroiliac and to lesser extent mild muscle edema in the left gluteus maximus muscle which may be reactive or secondary to infectious myositis. No intramuscular fluid collection. Electronically Signed   By: Kathreen Devoid   On: 12/12/2019 09:00   CT ASPIRATION  Result Date: 12/16/2019 INDICATION: 29 year old male with a history of left-sided hip pain, iliacus abscess, referred for aspiration EXAM: IMAGE GUIDED ASPIRATION OF LEFT PELVIC ABSCESS MEDICATIONS: The patient is currently admitted to the hospital and  receiving intravenous antibiotics. The antibiotics were administered within an appropriate time frame prior to the initiation of the procedure. ANESTHESIA/SEDATION: Fentanyl 1.0 mcg IV; Versed 50 mg IV Moderate Sedation Time:  12 minutes The patient was continuously monitored during the procedure by the interventional radiology nurse under my direct supervision. COMPLICATIONS: None PROCEDURE: Informed written consent was obtained from the patient after a thorough discussion of the procedural risks, benefits and alternatives. All questions were addressed. Maximal Sterile Barrier Technique was utilized including caps, mask, sterile gowns, sterile gloves, sterile drape, hand hygiene and skin antiseptic. A timeout was performed prior to the initiation of the procedure. Patient positioned supine position on the CT gantry table. Scout CT was acquired for planning purposes. The patient was then prepped and draped in the usual sterile fashion. 1% lidocaine was used for local anesthesia. Using CT guidance, trocar needle was advanced from anterior inferior left pelvis to the fluid collection overlying the left sacroiliac joint. We confirmed needle tip placement with CT guidance, and then aspirated approximately 5-6 cc of murky bloody fluid. Sample sent for culture. Needle was removed and a sterile bandage was placed. Patient tolerated the procedure well and remained hemodynamically stable throughout. No complications were encountered and no significant blood loss. IMPRESSION: Status post CT-guided aspiration of left pelvic abscess. Signed, Dulcy Fanny. Dellia Nims, RPVI [facility] Electronically Signed   By: Corrie Mckusick D.O.   On: 12/16/2019 10:49   ECHOCARDIOGRAM COMPLETE  Result Date: 12/16/2019    ECHOCARDIOGRAM REPORT   Patient Name:   Jordan [name] Date of Exam: 12/16/2019 Medical Rec #:  [id number]  Height:       70.0 in Accession #:    [id number]      Weight:       175.0 lb Date of Birth:  March 10, 1991       BSA:          1.972 m Patient Age:    29 years       BP:           104/70 mmHg Patient Gender: M              HR:           80 bpm. Exam Location:  Inpatient Procedure: 2D Echo, Cardiac Doppler and Color Doppler Indications:    Bacteremia 790.7 / R78.81  History:        Patient has prior history of Echocardiogram examinations, most                 recent 11/24/2019. Polysubstance abuse.  Sonographer:    Tiffany Dance Referring Phys: [id number] RONDELL A SMITH IMPRESSIONS  1. Left ventricular ejection fraction, by estimation, is 55 to 60%. The left ventricle has normal function. The left ventricle has no regional wall motion abnormalities. Left ventricular diastolic parameters were normal.  2. Right ventricular systolic function is normal. The right ventricular size is normal. Tricuspid regurgitation signal is inadequate for assessing PA pressure.  3. The mitral valve is normal in structure. No evidence of mitral valve regurgitation.  4. The aortic valve is tricuspid. Aortic valve regurgitation is not visualized. No aortic stenosis is present.  5. The inferior vena cava is dilated in size with >50% respiratory variability, suggesting right atrial pressure of 8 mmHg. Conclusion(s)/Recommendation(s): No vegetation seen. If high clinical suspicion for endocarditis, consider TEE. FINDINGS  Left Ventricle: Left ventricular ejection fraction, by estimation, is 55 to 60%. The left ventricle has normal function. The left ventricle has no regional wall motion abnormalities. The left ventricular internal cavity size was normal in size. There is  no left ventricular hypertrophy. Left ventricular diastolic parameters were normal. Right Ventricle: The right ventricular size is normal. Right vetricular wall thickness was not assessed. Right ventricular systolic function is normal. Tricuspid regurgitation signal is inadequate for assessing PA pressure. Left Atrium: Left atrial size  was normal in size. Right Atrium: Right atrial size was normal in size. Pericardium: There is no evidence of pericardial effusion. Mitral Valve: The mitral valve is normal in structure. No evidence of mitral valve regurgitation. Tricuspid Valve: The tricuspid valve is normal in structure. Tricuspid valve regurgitation is trivial. Aortic Valve: The aortic valve is tricuspid. Aortic valve regurgitation is not visualized. No aortic stenosis is present. Pulmonic Valve: The pulmonic valve was not well visualized. Pulmonic valve regurgitation is not visualized. Aorta: The aortic root and ascending aorta are structurally normal, with no evidence of dilitation. Venous: The inferior vena cava is dilated in size with greater than 50% respiratory variability, suggesting right atrial pressure of 8 mmHg. IAS/Shunts: The interatrial septum was not well visualized.  LEFT VENTRICLE PLAX 2D LVIDd:         5.10 cm  Diastology LVIDs:         3.30 cm  LV e' lateral:   14.80 cm/s LV PW:         1.00 cm  LV E/e' lateral: 6.4 LV IVS:        1.00 cm  LV e' medial:    11.00 cm/s LVOT diam:     2.20 cm  LV  E/e' medial:  8.6 LV SV:         87 LV SV Index:   44 LVOT Area:     3.80 cm  RIGHT VENTRICLE             IVC RV Basal diam:  3.10 cm     IVC diam: 2.10 cm RV Mid diam:    2.90 cm RV S prime:     17.00 cm/s TAPSE (M-mode): 2.1 cm LEFT ATRIUM             Index       RIGHT ATRIUM           Index LA diam:        3.80 cm 1.93 cm/m  RA Area:     16.50 cm LA Vol (A2C):   64.7 ml 32.81 ml/m RA Volume:   44.80 ml  22.72 ml/m LA Vol (A4C):   64.3 ml 32.60 ml/m LA Biplane Vol: 64.1 ml 32.50 ml/m  AORTIC VALVE LVOT Vmax:   117.00 cm/s LVOT Vmean:  79.000 cm/s LVOT VTI:    0.229 m  AORTA Ao Root diam: 3.10 cm Ao Asc diam:  2.40 cm MITRAL VALVE MV Area (PHT): 3.37 cm    SHUNTS MV Decel Time: 225 msec    Systemic VTI:  0.23 m MV E velocity: 94.60 cm/s  Systemic Diam: 2.20 cm MV A velocity: 53.60 cm/s MV E/A ratio:  1.76 Oswaldo Milian MD  Electronically signed by Oswaldo Milian MD Signature Date/Time: 12/16/2019/8:32:25 PM    Final    ECHO TEE  Result Date: 12/23/2019    TRANSESOPHOGEAL ECHO REPORT   Patient Name:   Jordan [name] Date of Exam: 12/23/2019 Medical Rec #:  [id number]      Height:       70.0 in Accession #:    [id number]     Weight:       169.5 lb Date of Birth:  05-14-1990       BSA:          1.946 m Patient Age:    29 years       BP:           111/49 mmHg Patient Gender: M              HR:           69 bpm. Exam Location:  Inpatient Procedure: Transesophageal Echo Indications:    bacteremia  History:        Patient has prior history of Echocardiogram examinations, most                 recent 12/16/2019. Risk Factors:Current Smoker. Polysubstance                 abuse.  Sonographer:    Jannett Celestine RDCS (AE) Referring Phys: [place]: The transesophogeal probe was passed without difficulty through the esophogus of the patient. Sedation performed by different physician. The patient's vital signs; including heart rate, blood pressure, and oxygen saturation; remained stable throughout the procedure. The patient developed no complications during the procedure. IMPRESSIONS  1. Left ventricular ejection fraction, by estimation, is 60 to 65%. The left ventricle has normal function. The left ventricle has no regional wall motion abnormalities.  2. Right ventricular systolic function is normal. The right ventricular size is normal.  3. No left atrial/left atrial appendage thrombus was detected.  4. The mitral valve is normal in structure. No evidence of  mitral valve regurgitation. No evidence of mitral stenosis.  5. The aortic valve is normal in structure. Aortic valve regurgitation is not visualized. No aortic stenosis is present.  6. The inferior vena cava is normal in size with greater than 50% respiratory variability, suggesting right atrial pressure of 3 mmHg. Conclusion(s)/Recommendation(s): Normal biventricular  function without evidence of hemodynamically significant valvular heart disease. No evidence of vegetation/infective endocarditis on this transesophageal echocardiogram. FINDINGS  Left Ventricle: Left ventricular ejection fraction, by estimation, is 60 to 65%. The left ventricle has normal function. The left ventricle has no regional wall motion abnormalities. The left ventricular internal cavity size was normal in size. There is  no left ventricular hypertrophy. Right Ventricle: The right ventricular size is normal. No increase in right ventricular wall thickness. Right ventricular systolic function is normal. Left Atrium: Left atrial size was normal in size. No left atrial/left atrial appendage thrombus was detected. Right Atrium: Right atrial size was normal in size. Pericardium: There is no evidence of pericardial effusion. Mitral Valve: The mitral valve is normal in structure. Normal mobility of the mitral valve leaflets. No evidence of mitral valve regurgitation. No evidence of mitral valve stenosis. Tricuspid Valve: The tricuspid valve is normal in structure. Tricuspid valve regurgitation is not demonstrated. No evidence of tricuspid stenosis. Aortic Valve: The aortic valve is normal in structure. Aortic valve regurgitation is not visualized. No aortic stenosis is present. Pulmonic Valve: The pulmonic valve was normal in structure. Pulmonic valve regurgitation is trivial. No evidence of pulmonic stenosis. Aorta: The aortic root is normal in size and structure. Venous: The inferior vena cava is normal in size with greater than 50% respiratory variability, suggesting right atrial pressure of 3 mmHg. IAS/Shunts: No atrial level shunt detected by color flow Doppler. Sanda Klein MD Electronically signed by Sanda Klein MD Signature Date/Time: 12/23/2019/1:52:04 PM    Final     Subjective: Patient left AMA, explain to him risk of worsening infection without treatment. I offer give him Oritavacin prior to  discharge, he didn't want to wait.  He asked for transfer , I explain to him most hospital in the area are at capacity, wouldn't happen right away.    I counseled him about drug use.   Discharge Exam: Vitals:   12/26/19 0659 12/26/19 0814  BP: 111/60 (!) 151/66  Pulse: 93 (!) 106  Resp: 18 18  Temp: 98.9 F (37.2 C) 98.7 F (37.1 C)  SpO2: 98% 94%     General: Pt is alert, awake, not in acute distress Cardiovascular: RRR, S1/S2 +, no rubs, no gallops Respiratory: CTA bilaterally, no wheezing, no rhonchi Abdominal: Soft, NT, ND, bowel sounds + Extremities: no edema, no cyanosis    The results of significant diagnostics from this hospitalization (including imaging, microbiology, ancillary and laboratory) are listed below for reference.     Microbiology: No results found for this or any previous visit (from the past 240 hour(s)).   Labs: BNP (last 3 results) No results for input(s): BNP in the last 8760 hours. Basic Metabolic Panel: Recent Labs  Lab 12/23/19 0450  NA 137  K 4.0  CL 102  CO2 23  GLUCOSE 96  BUN 19  CREATININE 0.76  CALCIUM 9.4   Liver Function Tests: No results for input(s): AST, ALT, ALKPHOS, BILITOT, PROT, ALBUMIN in the last 168 hours. No results for input(s): LIPASE, AMYLASE in the last 168 hours. No results for input(s): AMMONIA in the last 168 hours. CBC: Recent Labs  Lab 12/26/19 0830  WBC 12.1*  HGB 10.5*  HCT 34.6*  MCV 82.8  PLT 334   Cardiac Enzymes: No results for input(s): CKTOTAL, CKMB, CKMBINDEX, TROPONINI in the last 168 hours. BNP: Invalid input(s): POCBNP CBG: No results for input(s): GLUCAP in the last 168 hours. D-Dimer No results for input(s): DDIMER in the last 72 hours. Hgb A1c No results for input(s): HGBA1C in the last 72 hours. Lipid Profile No results for input(s): CHOL, HDL, LDLCALC, TRIG, CHOLHDL, LDLDIRECT in the last 72 hours. Thyroid function studies No results for input(s): TSH, T4TOTAL, T3FREE,  THYROIDAB in the last 72 hours.  Invalid input(s): FREET3 Anemia work up No results for input(s): VITAMINB12, FOLATE, FERRITIN, TIBC, IRON, RETICCTPCT in the last 72 hours. Urinalysis    Component Value Date/Time   COLORURINE YELLOW 11/17/2019 2009   APPEARANCEUR CLEAR 11/17/2019 2009   LABSPEC 1.026 11/17/2019 2009   PHURINE 6.0 11/17/2019 2009   GLUCOSEU 50 (A) 11/17/2019 2009   HGBUR NEGATIVE 11/17/2019 2009   [place] NEGATIVE 11/17/2019 2009   [place] NEGATIVE 11/17/2019 2009   PROTEINUR NEGATIVE 11/17/2019 2009   NITRITE NEGATIVE 11/17/2019 2009   LEUKOCYTESUR NEGATIVE 11/17/2019 2009   Sepsis Labs Invalid input(s): PROCALCITONIN,  WBC,  LACTICIDVEN Microbiology No results found for this or any previous visit (from the past 240 hour(s)).   Time coordinating discharge: 40 minutes  SIGNED:   Elmarie Shiley, MD  [facility]

## 2019-12-26 NOTE — Progress Notes (Addendum)
PROGRESS NOTE    Jordan [name]  STM:[id number] DOB: 06/08/1990 DOA: 12/15/2019 PCP: Patient, No Pcp Per   Brief Narrative: 29 year old with past medical history significant for IV drug use, hospitalized 7/19-7/27 for MRSA bacteremia, TEE negative, treated with vancomycin and then discharged on Zyvox. -Readmitted with fever and worsening low back pain on 8/13 growing MRSA bacteremia and left AMA.  MRI at that time was consistent with osteomyelitis of the left SI joint.  -He was found passed out in front of the [facility], unresponsive.  Was using fentanyl prior to that.  He was given Narcan and brought to the ER. -Restarted on IV vancomycin for recent MRSA bacteremia and osteomyelitis/iliacus abscess, aspiration of SI joint grew MRSA -Infectious disease following, TEE on 8/24 -.    Assessment & Plan:   Principal Problem:   Acute osteomyelitis, pelvis (HCC) Active Problems:   Polysubstance abuse ([place])   Bacteremia   MRSA bacteremia   Drug overdose   Leukocytosis   Thrombocytosis (HCC)   Abscess   1-MRSA Bacteremia/IV drug abuse Left SI joint septic arthritis/Osteomyelitis, Iliopsoas Abscess;  -Blood cultures from recent hospitalization with MRSA, Aspiration from left iliac muscle abscess growing MRSA.  -TEE 7/26 negative -Repeated Blood culture negative.  -ID recommended total 2 weeks of IV vancomycin until 8/31 y=the a dose of Oritavancin on September 1.   2-IV drug use, high risk of opiate withdrawal Uses large amount of Fentanyl, cocaine, methamphetamine, benzodiazepine.  -He was started on Opiate protocol with clonidine, baclofen, NSAI.  -8-21 --22 overnight snorted Amphetamines inpatient, brought by a friend.  -Events from last night notice. It seems patient use illicit drugs in the hospital (2-3 Times yesterday.) . A syringe was found in his IV. This morning he was notice to be falling sleep while eating breakfast.  -Plan;  -Will have security to search room.  -Narcan order  PRN.  -Psych consulted.  -IV fluids, bolus.  -Person/Sitter at bedside.  -Explain to patient this behavior is not allows in the hospital.    Estimated body mass index is 24.33 kg/m as calculated from the following:   Height as of this encounter: 5\' 10"  (1.778 m).   Weight as of this encounter: 76.9 kg.   DVT prophylaxis: Lovenox Code Status: Full code.  Family Communication: Care discussed with Patient  Disposition Plan:  Status is: Inpatient  Remains inpatient appropriate because:Unsafe d/c plan   Dispo: The patient is from: Home              Anticipated d/c is to: Home              Anticipated d/c date is: > 3 days              Patient currently is not medically stable to d/c.        Consultants:   ID  Procedures:   None  Antimicrobials:  Vancomycin   Subjective: He was alert, falling sleep in between conversation. He denies using drugs, he was just flushing his IV.  I asked patient if he wants to harm himself, if he wants to kill him self? He answer no "hell No"  I advised him not to use illicit drugs.   Objective: Vitals:   12/26/19 0300 12/26/19 0457 12/26/19 0659 12/26/19 0814  BP: (!) 159/114 118/63 111/60 (!) 151/66  Pulse: (!) 146 94 93 (!) 106  Resp: 19 18 18 18   Temp: 98.6 F (37 C) 98.9 F (37.2 C) 98.9 F (37.2 C) 98.7  F (37.1 C)  TempSrc: Oral  Oral Oral  SpO2: 100% 96% 98% 94%  Weight:      Height:        Intake/Output Summary (Last 24 hours) at 12/26/2019 0830 Last data filed at 12/26/2019 0200 Gross per 24 hour  Intake 1993.09 ml  Output 400 ml  Net 1593.09 ml   Filed Weights   12/16/19 1922 12/17/19 2122 12/22/19 2105  Weight: 79.4 kg 79.4 kg 76.9 kg    Examination:  General exam: NAD, alert, fall sleep in between conversation.  Respiratory system: CTA Cardiovascular system: S 1, S 2 RRR Gastrointestinal system: BS present, soft, nt Central nervous system: Alert Extremities: no edema   Data Reviewed: I have  personally reviewed following labs and imaging studies  CBC: No results for input(s): WBC, NEUTROABS, HGB, HCT, MCV, PLT in the last 168 hours. Basic Metabolic Panel: Recent Labs  Lab 12/23/19 0450  NA 137  K 4.0  CL 102  CO2 23  GLUCOSE 96  BUN 19  CREATININE 0.76  CALCIUM 9.4   GFR: Estimated Creatinine Clearance: 141.9 mL/min (by C-G formula based on SCr of 0.76 mg/dL). Liver Function Tests: No results for input(s): AST, ALT, ALKPHOS, BILITOT, PROT, ALBUMIN in the last 168 hours. No results for input(s): LIPASE, AMYLASE in the last 168 hours. No results for input(s): AMMONIA in the last 168 hours. Coagulation Profile: Recent Labs  Lab 12/23/19 0450  INR 1.1   Cardiac Enzymes: No results for input(s): CKTOTAL, CKMB, CKMBINDEX, TROPONINI in the last 168 hours. BNP (last 3 results) No results for input(s): PROBNP in the last 8760 hours. HbA1C: No results for input(s): HGBA1C in the last 72 hours. CBG: No results for input(s): GLUCAP in the last 168 hours. Lipid Profile: No results for input(s): CHOL, HDL, LDLCALC, TRIG, CHOLHDL, LDLDIRECT in the last 72 hours. Thyroid Function Tests: No results for input(s): TSH, T4TOTAL, FREET4, T3FREE, THYROIDAB in the last 72 hours. Anemia Panel: No results for input(s): VITAMINB12, FOLATE, FERRITIN, TIBC, IRON, RETICCTPCT in the last 72 hours. Sepsis Labs: No results for input(s): PROCALCITON, LATICACIDVEN in the last 168 hours.  Recent Results (from the past 240 hour(s))  Aerobic/Anaerobic Culture (surgical/deep wound)     Status: None   Collection Time: 12/16/19  9:02 AM   Specimen: Wound; Abscess  Result Value Ref Range Status   Specimen Description WOUND  Final   Special Requests LEFT ILIACUS MUSCLE ABCESS  Final   Gram Stain   Final    ABUNDANT WBC PRESENT, PREDOMINANTLY PMN FEW GRAM POSITIVE COCCI IN PAIRS    Culture   Final    MODERATE METHICILLIN RESISTANT STAPHYLOCOCCUS AUREUS NO ANAEROBES ISOLATED Performed at  [facility], [address], [place], [place] [address]    Report Status 12/22/2019 FINAL  Final   Organism ID, Bacteria METHICILLIN RESISTANT STAPHYLOCOCCUS AUREUS  Final      Susceptibility   Methicillin resistant staphylococcus aureus - MIC*    CIPROFLOXACIN >=8 RESISTANT Resistant     ERYTHROMYCIN >=8 RESISTANT Resistant     GENTAMICIN <=0.5 SENSITIVE Sensitive     OXACILLIN >=4 RESISTANT Resistant     TETRACYCLINE <=1 SENSITIVE Sensitive     VANCOMYCIN <=0.5 SENSITIVE Sensitive     TRIMETH/SULFA >=320 RESISTANT Resistant     CLINDAMYCIN >=8 RESISTANT Resistant     RIFAMPIN <=0.5 SENSITIVE Sensitive     Inducible Clindamycin NEGATIVE Sensitive     * MODERATE METHICILLIN RESISTANT STAPHYLOCOCCUS AUREUS  Radiology Studies: No results found.      Scheduled Meds: . busPIRone  5 mg Oral BID  . enoxaparin (LOVENOX) injection  40 mg Subcutaneous Q24H  . nicotine  21 mg Transdermal Daily  . sodium chloride flush  10-40 mL Intracatheter Q12H   Continuous Infusions: . sodium chloride 250 mL (12/26/19 0801)  . sodium chloride    . sodium chloride    . vancomycin 1,250 mg (12/26/19 0803)     LOS: 11 days    Time spent: 35 minutes.     Elmarie Shiley, MD [facility]   If 7PM-7AM, please contact night-coverage [email or web address]  12/26/2019, 8:30 AM

## 2019-12-26 NOTE — Progress Notes (Signed)
Called in to room by sitter because patient is on the bathroom floor. When 2 RNs went to patient's bathroom,patient was unresponsive for a few seconds and when he came to,he was drooling,diaphoretic and very drowsy,eyes glazed. There was also a 10 cc syringe stuck on his left upper arm midline. Per patient,he was flushing it because it was hurting him. Patient assisted back to bed.Patient denies any c/o pain,able to move all extremities without difficulty. No obvious injury noted. VS taken. Rapid response RN was called. MD, Opyd made aware and he came up to assess patient. Patient was then moved to room 5M05. Upon search of patient room and bathroom,NT found a 10cc syringe with 3cc liquid on it and a folded piece of paper tucked under the radiator in the bathroom.MD made aware.
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
## 2019-12-26 NOTE — Progress Notes (Signed)
Patient belligerent and request removal of mid line. Line removed. Refusal to sign AMA papers. MD, Dr. Tyrell Antonio, aware and at the bedside. Patient left. Dorthey Sawyer, RN

## 2019-12-26 NOTE — Progress Notes (Signed)
Patient found to be nodding at the bedside. This charge RNSecurity called to the bedside for room search. Patient agreeable to search. 1 business card found folded up in patient's wallet with powder substance and pill and removed from the room. Dr. Tyrell Antonio made aware. Substance discarded in the trash can. Department leadership made aware. Safety sitter at the bedside. Will continue to monitor. Dorthey Sawyer, RN

## 2020-06-26 ENCOUNTER — Emergency Department (HOSPITAL_COMMUNITY)
Admission: EM | Admit: 2020-06-26 | Discharge: 2020-06-27 | Disposition: A | Discharge status: Home / Self Care | Payer: Self-pay | Attending: Emergency Medicine | Admitting: Emergency Medicine

## 2020-06-26 DIAGNOSIS — S0083XA Contusion of other part of head, initial encounter: Secondary | ICD-10-CM | POA: Insufficient documentation

## 2020-06-26 DIAGNOSIS — F17210 Nicotine dependence, cigarettes, uncomplicated: Secondary | ICD-10-CM | POA: Insufficient documentation

## 2020-06-26 DIAGNOSIS — Y92149 Unspecified place in prison as the place of occurrence of the external cause: Secondary | ICD-10-CM | POA: Insufficient documentation

## 2020-06-27 ENCOUNTER — Emergency Department (HOSPITAL_COMMUNITY): Payer: Self-pay

## 2020-06-27 ENCOUNTER — Encounter (HOSPITAL_COMMUNITY): Payer: Self-pay | Admitting: Emergency Medicine

## 2020-06-27 ENCOUNTER — Other Ambulatory Visit: Payer: Self-pay

## 2020-06-27 MED ORDER — FLUORESCEIN SODIUM 1 MG OP STRP
1.0000 | ORAL_STRIP | Freq: Once | OPHTHALMIC | Status: AC
  Administered 2020-06-27: 1 via OPHTHALMIC
  Filled 2020-06-27: qty 1
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
## 2021-10-19 IMAGING — MR MR PELVIS W/O CM
4 of 5 series · 32 of 48 positions shown · non-contrast
Comparison: None.

CLINICAL DATA: Elevated ESR. MRSA bacteremia in [REDACTED]. Left
posterior pelvic pain. Left buttock pain.

EXAM:
MRI PELVIS WITHOUT CONTRAST
TECHNIQUE: Multiplanar multisequence MR imaging of the pelvis was performed. No
intravenous contrast was administered.

[Series 3: T1 · axial · 4.0mm · 0.78mm/px · z∈[-93,+162]mm · 8 of 52 slices shown (1 of 2)]
[im 1/52]
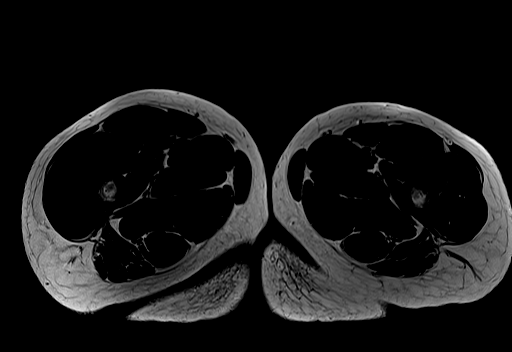
[im 6/52]
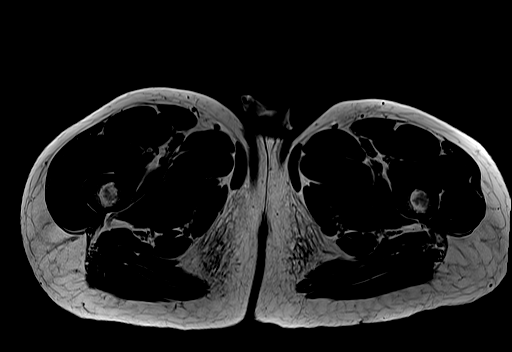
[im 18/52]
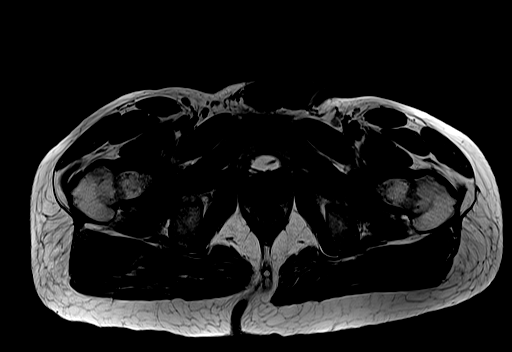
[im 23/52]
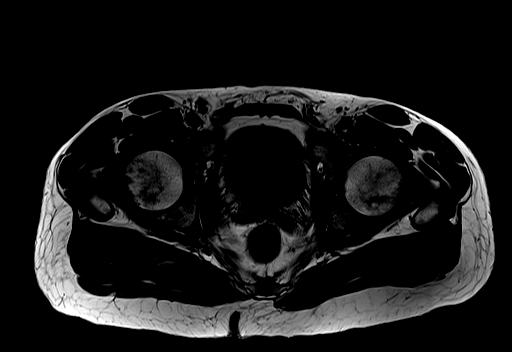
[im 29/52]
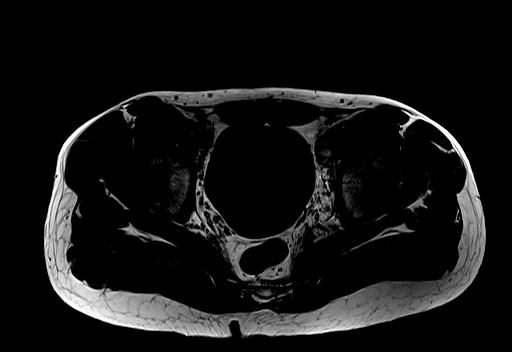
[im 35/52]
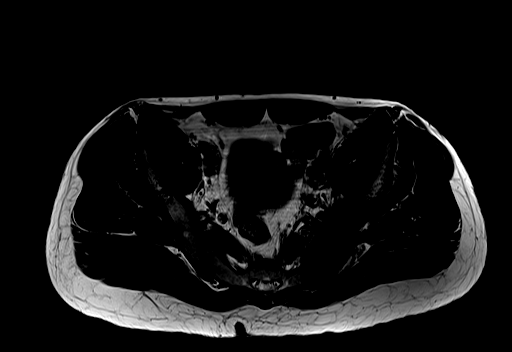
[im 46/52]
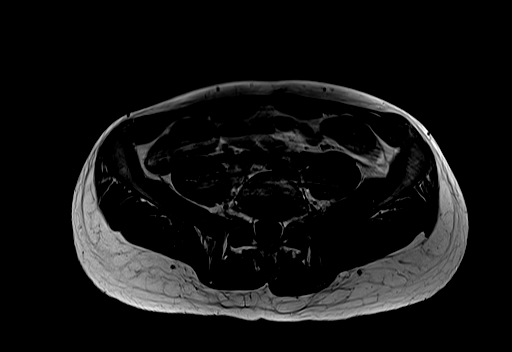
[im 52/52]
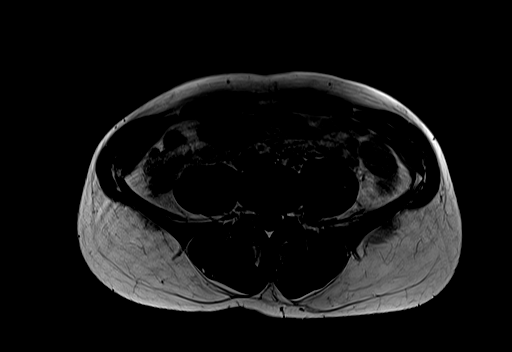

[Series 4: T2 fat-sat · axial · 4.0mm · 0.86mm/px · z∈[-93,+162]mm · 11 of 52 slices shown]
[im 1/52]
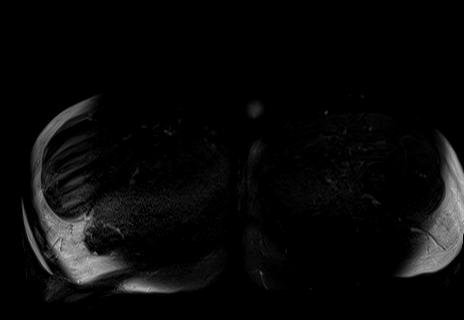
[im 6/52]
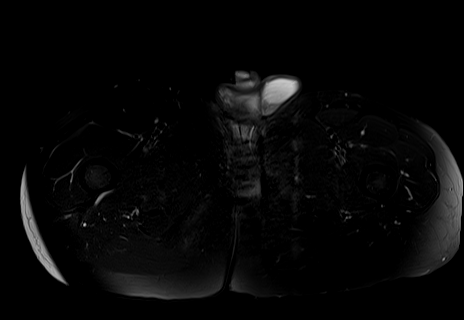
[im 11/52]
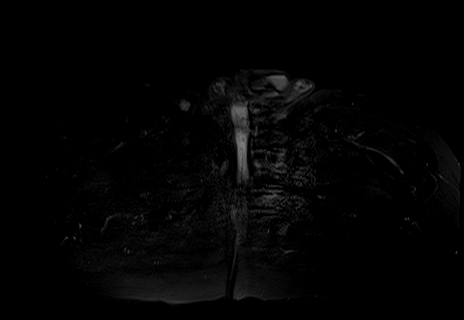
[im 16/52]
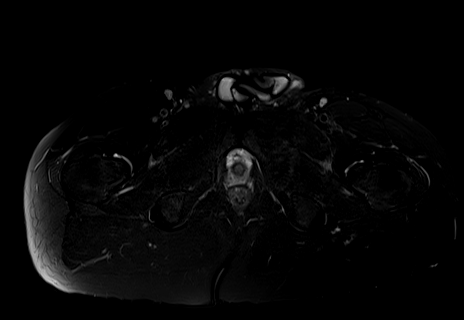
[im 21/52]
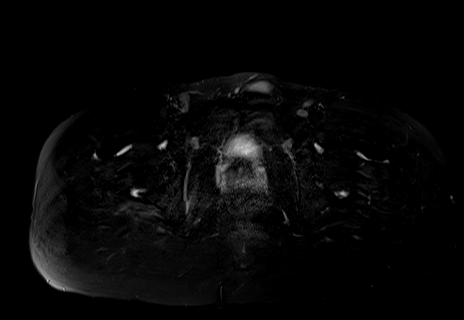
[im 26/52]
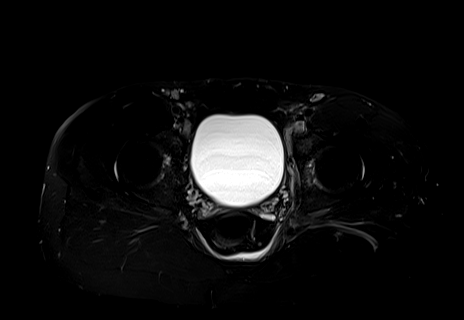
[im 31/52]
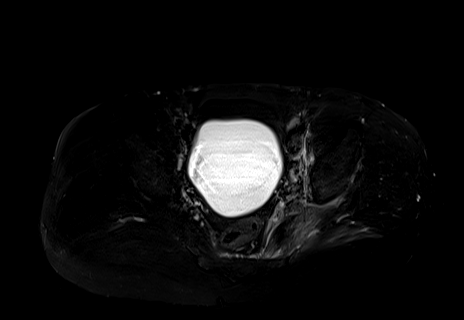
[im 36/52]
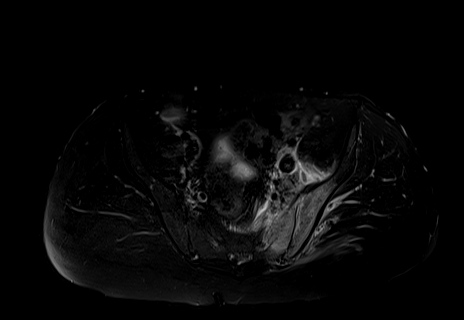
[im 41/52]
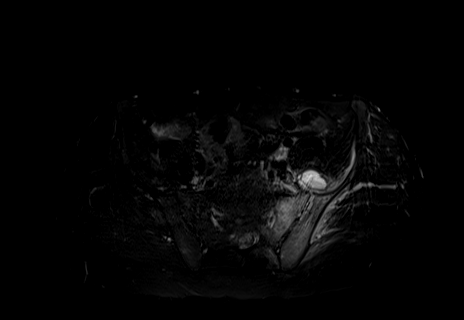
[im 46/52]
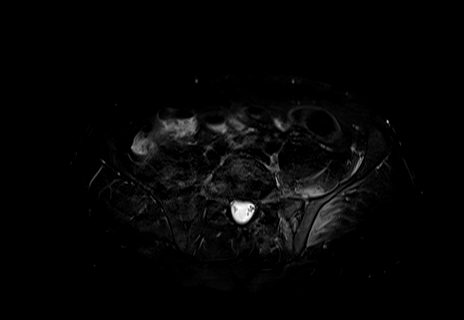
[im 52/52]
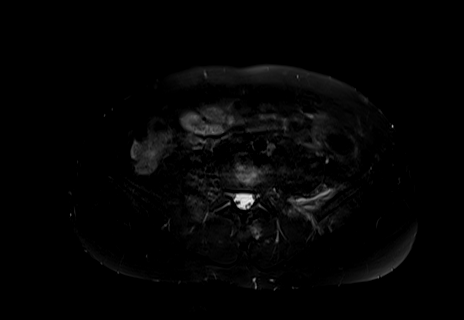

[Series 5: T1 · coronal · 4.0mm · 1.04mm/px · 9 of 40 slices shown (2 of 2)]
[im 1/40]
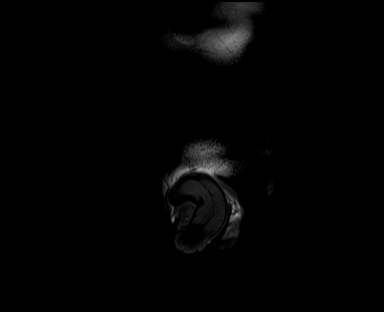
[im 5/40]
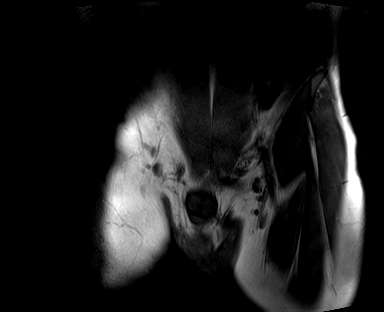
[im 10/40]
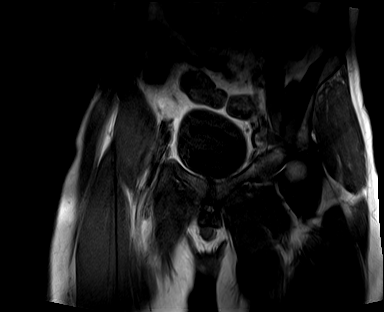
[im 15/40]
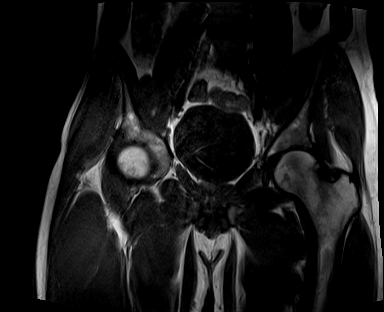
[im 20/40]
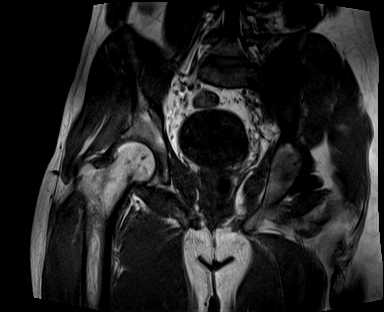
[im 25/40]
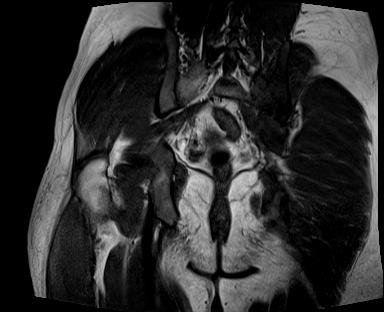
[im 30/40]
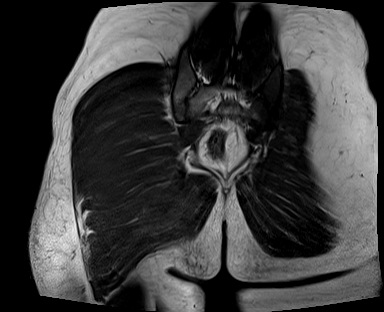
[im 35/40]
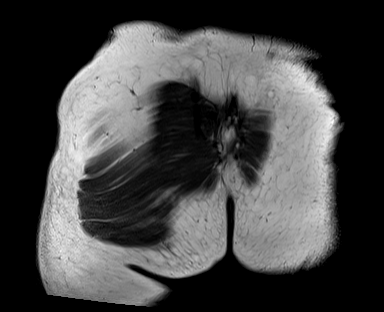
[im 40/40]
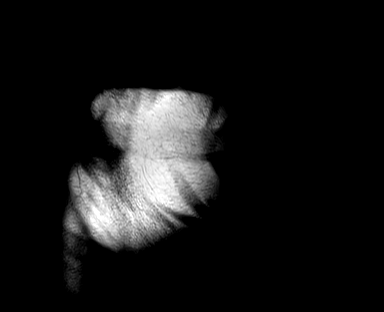

[Series 7: STIR · coronal · 4.0mm · 1.04mm/px · 4 of 40 slices shown]
[im 1/40]
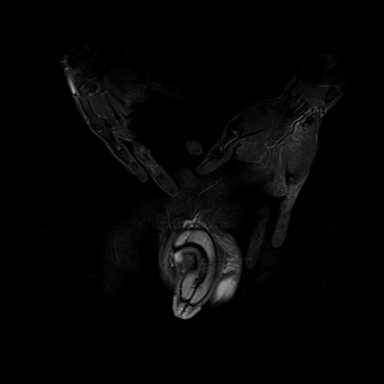
[im 5/40]
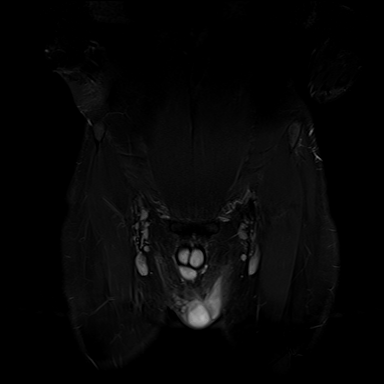
[im 20/40]
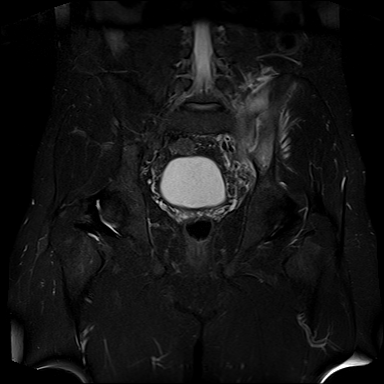
[im 35/40]
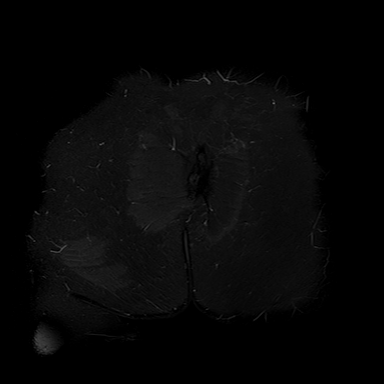

[32 of 48 positions shown; findings below may reference images not displayed]

FINDINGS: Bones:

No hip fracture, dislocation or avascular necrosis. No periosteal
reaction or bone destruction. No aggressive osseous lesion.

Mild osteoarthritis of the right SI joint.

Severe bone marrow edema centered around the left sacroiliac joint
with adjacent soft tissue edema with a left SI joint effusion which
is decompressing anteriorly where there is a focal 1.7 x 2.5 cm
component deep to the iliopsoas muscle.

Articular cartilage and labrum

Articular cartilage:  No chondral defect.

Labrum: Grossly intact, but evaluation is limited by lack of
intraarticular fluid.

Joint or bursal effusion

Joint effusion:  No hip joint effusion.  No SI joint effusion.

Bursae:  No bursa formation.

Muscles and tendons

Flexors: Normal.

Extensors: Normal.

Abductors: Normal.

Adductors: Normal.

Rotators: Muscle edema in the superior aspect of the left piriformis
muscle adjacent to the inferior aspect of the left sacroiliac
joint/which may be reactive versus secondary to infectious myositis.
There is mild muscle edema in the left gluteus maximus muscle also
contiguous with the posterior aspect of the left sacroiliac joint
which may be again reactive or secondary to infectious myositis. No
intramuscular fluid collection.

Hamstrings: Normal.

Other findings

No pelvic free fluid. No fluid collection or hematoma. No inguinal
lymphadenopathy. No inguinal hernia.
IMPRESSION: 1. Severe bone marrow edema centered around the left sacroiliac
joint with a left SI joint effusion which is decompressing
anteriorly where there is a focal 1.7 x 2.5 cm component deep to the
iliopsoas muscle. Findings are most consistent with osteomyelitis of
the left sacroiliac joint.
2. Muscle edema in the superior aspect of the left piriformis muscle
adjacent to the inferior aspect of the left sacroiliac and to lesser
extent mild muscle edema in the left gluteus maximus muscle which
may be reactive or secondary to infectious myositis. No
intramuscular fluid collection.

## 2021-10-19 IMAGING — CT CT PELVIS W/ CM
2 of 4 series · 15 of 46 positions shown, 17 images · IV contrast (omnipaque)
Comparison: None.

CLINICAL DATA: Left leg pain. Recent hospitalization for MRSA.
Question osteomyelitis.

EXAM:
CT PELVIS WITH CONTRAST
TECHNIQUE: Multidetector CT imaging of the pelvis was performed using the
standard protocol following the bolus administration of intravenous
contrast.
CONTRAST:  100mL OMNIPAQUE IOHEXOL 300 MG/ML  SOLN

[Series 2: axial st · axial · 0.82mm/px · z∈[+794,+1084]mm · 12 of 68 slices shown, 14 images]
[im 5/68  soft-tissue]
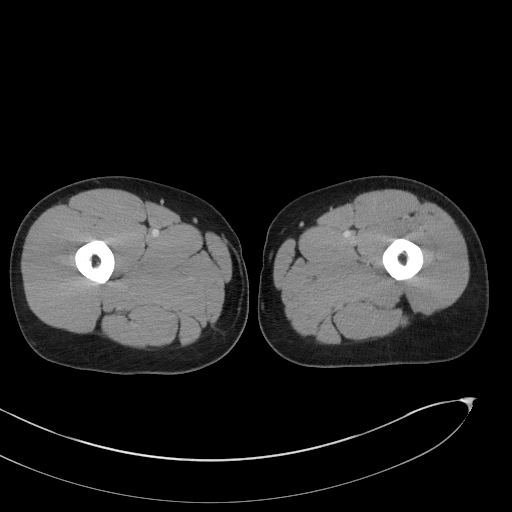
[im 5/68  bone]
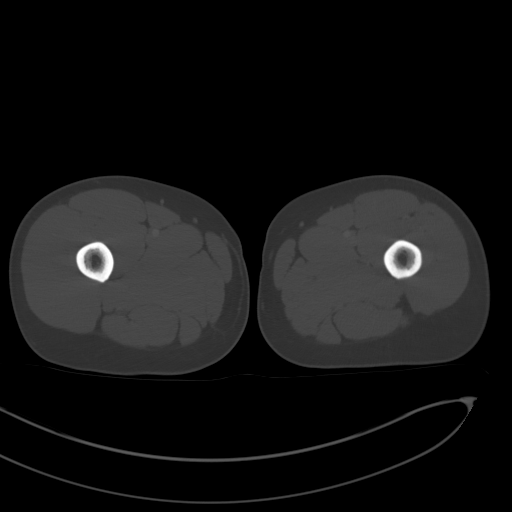
[im 9/68  soft-tissue]
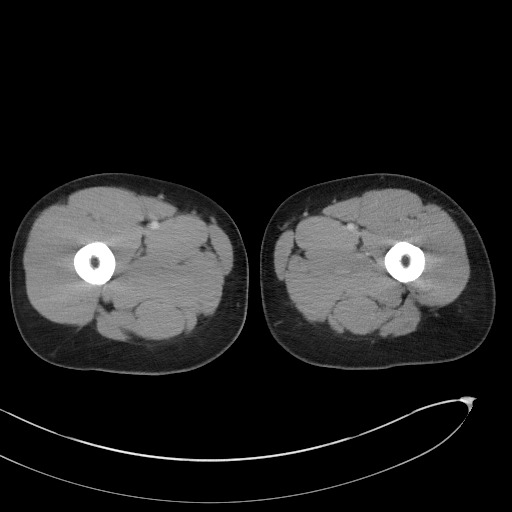
[im 14/68  soft-tissue]
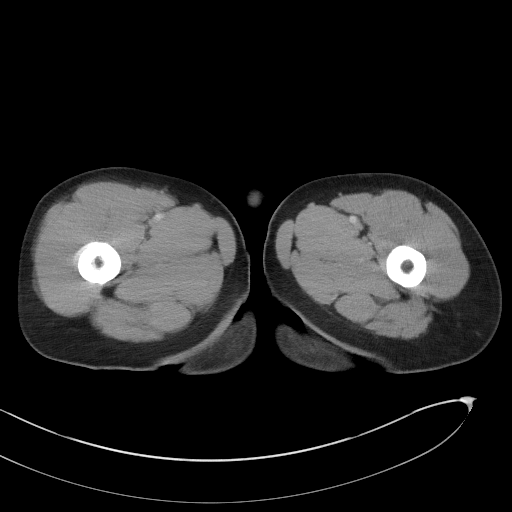
[im 23/68  soft-tissue]
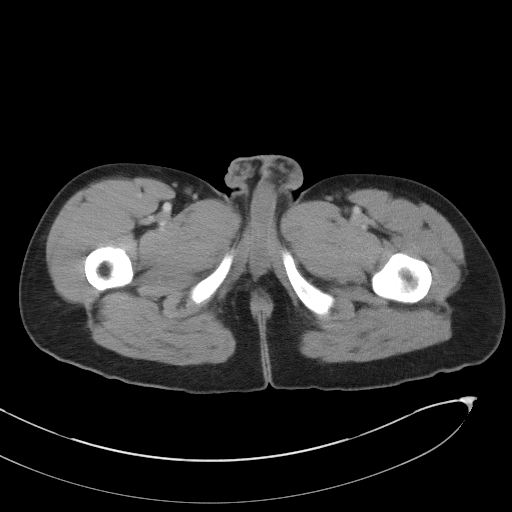
[im 27/68  soft-tissue]
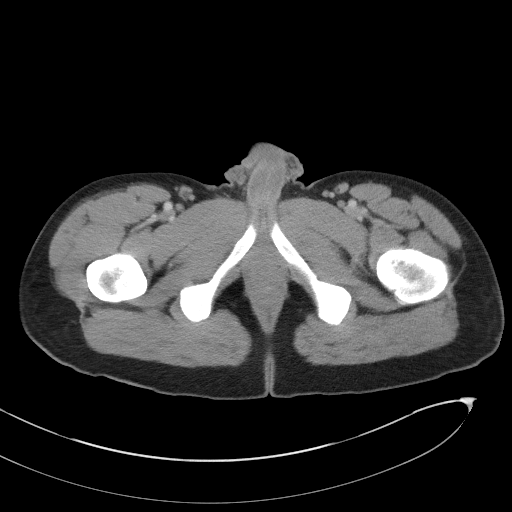
[im 32/68  soft-tissue]
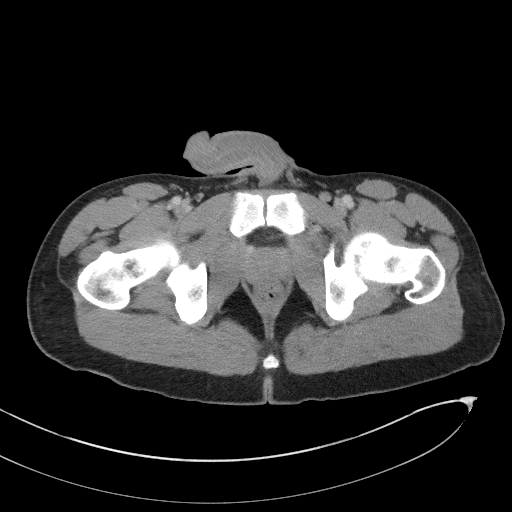
[im 36/68  soft-tissue]
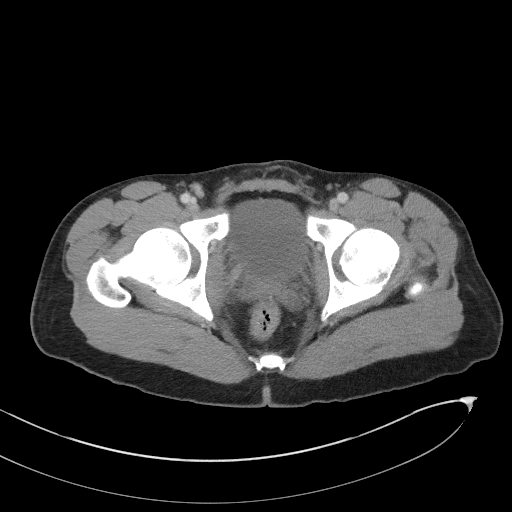
[im 41/68  soft-tissue]
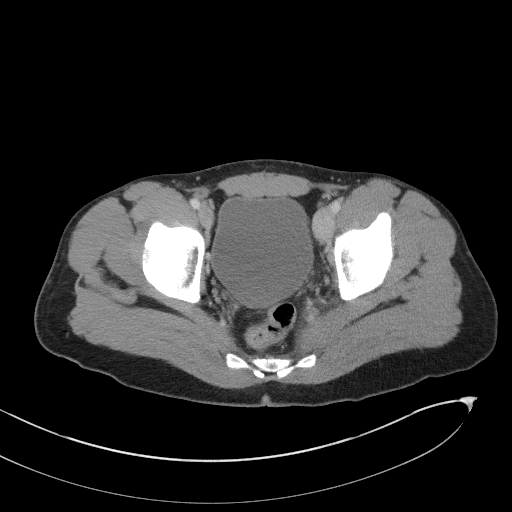
[im 45/68  soft-tissue]
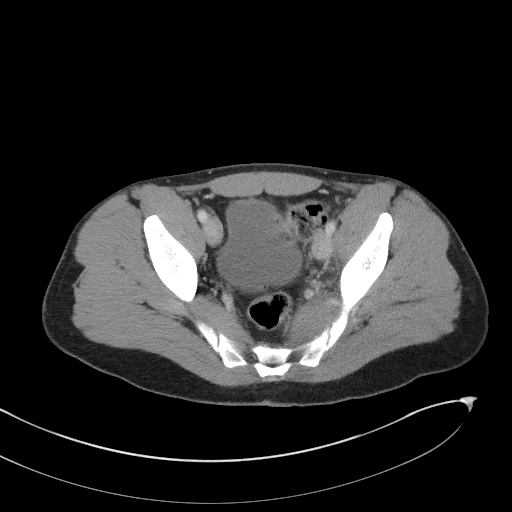
[im 45/68  bone]
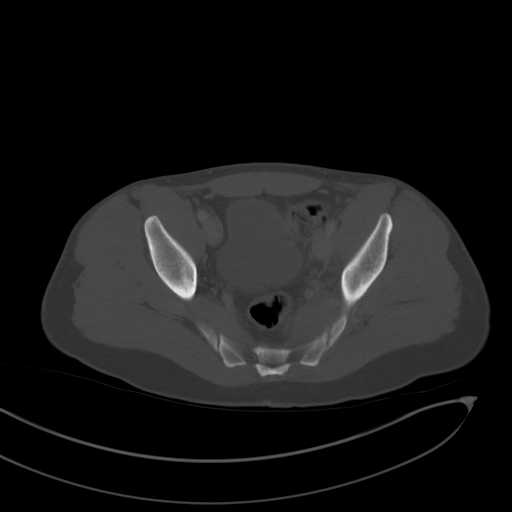
[im 54/68  soft-tissue]
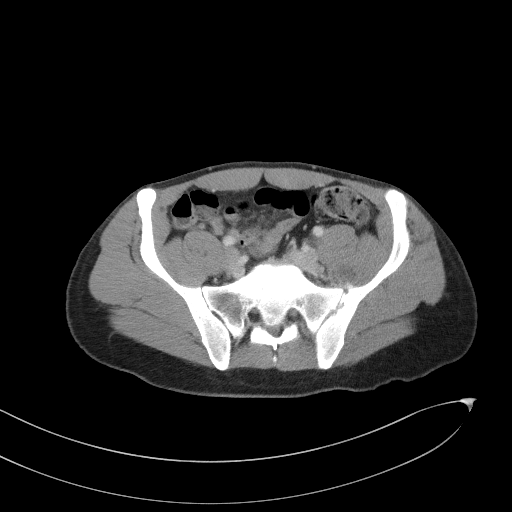
[im 59/68  soft-tissue]
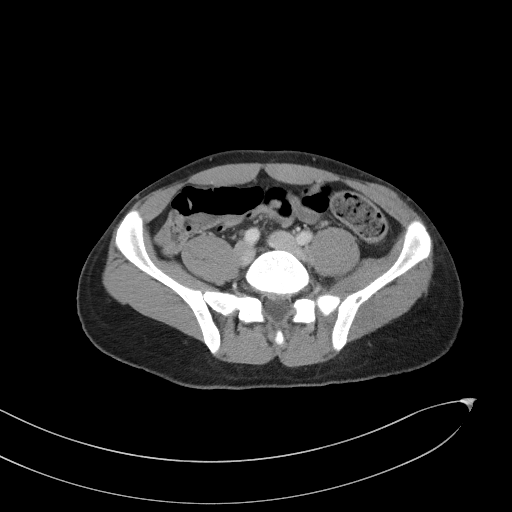
[im 63/68  soft-tissue]
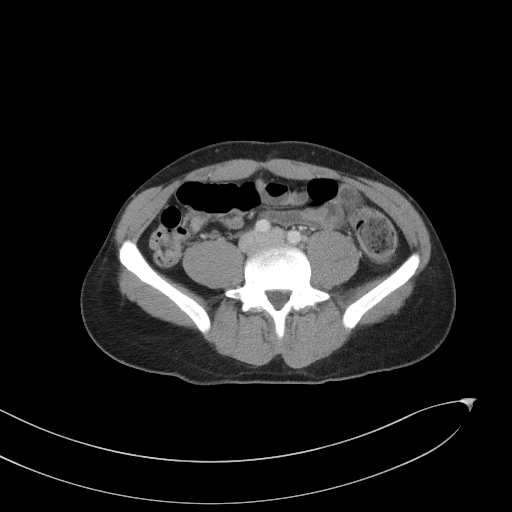

[Series 4: coronal st · coronal · 0.66mm/px · 3 of 78 slices shown]
[im 26/78  soft-tissue]
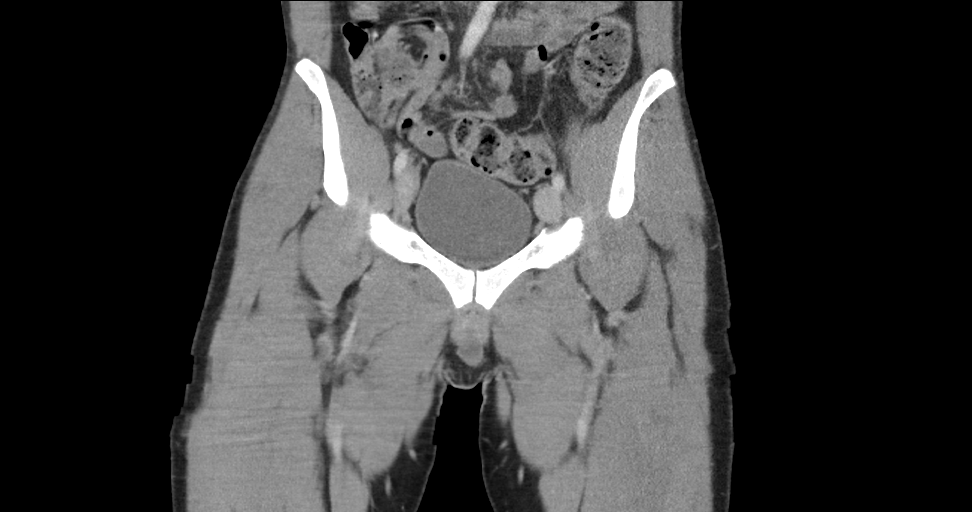
[im 35/78  soft-tissue]
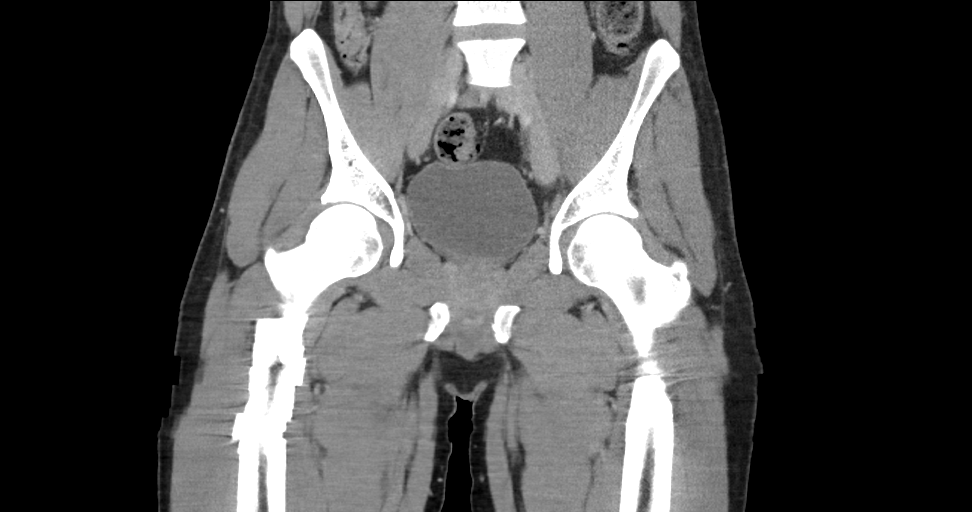
[im 43/78  soft-tissue]
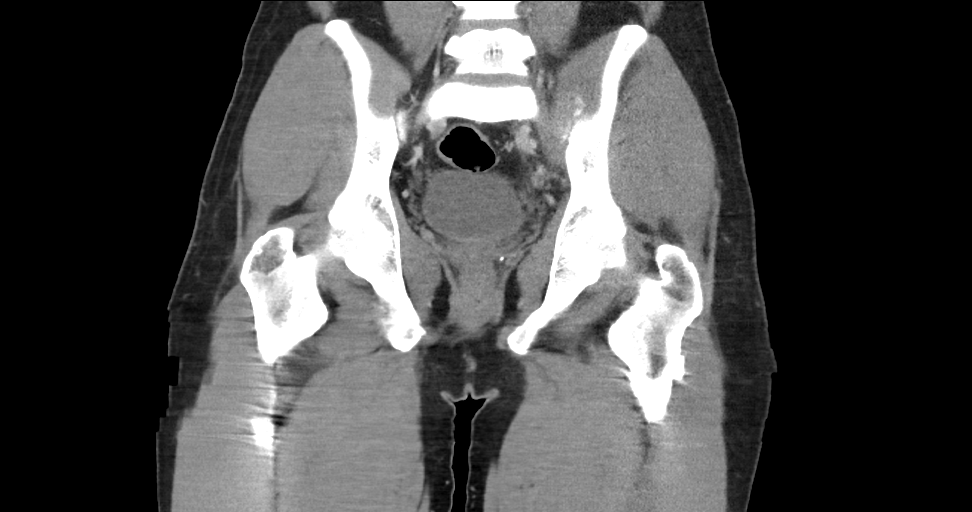

[15 of 46 positions shown; findings below may reference images not displayed]

FINDINGS: Urinary Tract: Distal ureters and urinary bladder are within normal
limits.

Bowel:  Unremarkable visualized pelvic bowel loops.

Vascular/Lymphatic: No pathologically enlarged lymph nodes. No
significant vascular abnormality seen.

Reproductive:  Prostate is unremarkable.

Musculoskeletal: Osseous pelvis is unremarkable. The hips are
located and within normal limits. Proximal femurs are normal
bilaterally. No discrete soft tissue abscess or focal osteolysis is
present. There is some motion artifact.
IMPRESSION: 1. No osseous abnormality in the pelvis or proximal femurs.
2. Normal CT of the pelvis.

## 2021-12-06 ENCOUNTER — Other Ambulatory Visit: Payer: Self-pay

## 2021-12-06 ENCOUNTER — Emergency Department (HOSPITAL_COMMUNITY)
Admission: EM | Admit: 2021-12-06 | Discharge: 2021-12-07 | Disposition: A | Discharge status: Home / Self Care | Payer: Self-pay | Attending: Emergency Medicine | Admitting: Emergency Medicine

## 2021-12-06 DIAGNOSIS — Z5321 Procedure and treatment not carried out due to patient leaving prior to being seen by health care provider: Secondary | ICD-10-CM | POA: Insufficient documentation

## 2021-12-06 DIAGNOSIS — K1379 Other lesions of oral mucosa: Secondary | ICD-10-CM | POA: Insufficient documentation

## 2021-12-06 LAB — BASIC METABOLIC PANEL
Anion gap: 14 (ref 5–15)
BUN: 23 mg/dL — ABNORMAL HIGH (ref 6–20)
CO2: 18 mmol/L — ABNORMAL LOW (ref 22–32)
Calcium: 9.7 mg/dL (ref 8.9–10.3)
Chloride: 99 mmol/L (ref 98–111)
Creatinine, Ser: 1.04 mg/dL (ref 0.61–1.24)
GFR, Estimated: >60 mL/min (ref >60)
Glucose, Bld: 104 mg/dL — ABNORMAL HIGH (ref 70–99)
Potassium: 3.8 mmol/L (ref 3.5–5.1)
Sodium: 131 mmol/L — ABNORMAL LOW (ref 135–145)

## 2021-12-06 LAB — CBC WITH DIFFERENTIAL/PLATELET
Abs Immature Granulocytes: 0.03 10*3/uL (ref 0.00–0.07)
Basophils Absolute: 0 10*3/uL (ref 0.0–0.1)
Basophils Relative: 0 %
Eosinophils Absolute: 0.3 10*3/uL (ref 0.0–0.5)
Eosinophils Relative: 3 %
HCT: 42.7 % (ref 39.0–52.0)
Hemoglobin: 14.2 g/dL (ref 13.0–17.0)
Immature Granulocytes: 0 %
Lymphocytes Relative: 23 %
Lymphs Abs: 2.3 10*3/uL (ref 0.7–4.0)
MCH: 28 pg (ref 26.0–34.0)
MCHC: 33.3 g/dL (ref 30.0–36.0)
MCV: 84.1 fL (ref 80.0–100.0)
Monocytes Absolute: 1.3 10*3/uL — ABNORMAL HIGH (ref 0.1–1.0)
Monocytes Relative: 13 %
Neutro Abs: 6.1 10*3/uL (ref 1.7–7.7)
Neutrophils Relative %: 61 %
Platelets: 264 10*3/uL (ref 150–400)
RBC: 5.08 MIL/uL (ref 4.22–5.81)
RDW: 13.7 % (ref 11.5–15.5)
WBC: 10 10*3/uL (ref 4.0–10.5)
nRBC: 0 % (ref 0.0–0.2)

## 2021-12-06 LAB — RAPID HIV SCREEN (HIV 1/2 AB+AG)
HIV 1/2 Antibodies: NONREACTIVE
HIV-1 P24 Antigen - HIV24: NONREACTIVE

## 2021-12-06 LAB — LACTIC ACID, PLASMA: Lactic Acid, Venous: 1.1 mmol/L (ref 0.5–1.9)

## 2021-12-06 NOTE — ED Triage Notes (Signed)
Pt presents via pov c/o painful lesions to mouth and soles of feet for x3 days. 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 HR 120s in triage, denies CP/SOB. States wanting resource for drug use. Denies SI/HI. Denies fever/chills.

## 2021-12-06 NOTE — ED Provider Triage Note (Signed)
Emergency Medicine Provider Triage Evaluation Note  Jordan [name] , a 31 y.o. male  was evaluated in triage.  Pt complains of painful sores in his mouth that started about 3 days ago.  He is also complaining of redness and pain in his feet as well.  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  He has tried brushing his teeth to help with the mouth sores, however they continue to get worse.  No known history of HIV.  Patient denies chest pain or shortness of breath although he does feel like his heart is racing and heart rate noted to be 120s in triage.  Denies fever, chills.  Review of Systems  Positive:  Negative:   Physical Exam  BP (!) 149/103 (BP Location: Right Arm)   Pulse (!) 119   Temp 99.6 F (37.6 C) (Oral)   Resp 19   Ht '5\' 10"'$  (1.778 m)   Wt 76.7 kg   SpO2 97%   BMI 24.25 kg/m  Gen:   Awake, no distress   Resp:  Normal effort  MSK:   Moves extremities without difficulty  Other:  Mouth has numerous lesions all over the tongue.  Oropharynx without erythema or lesions; no lesions noted to the feet, however there is some erythema, particularly to the left and the right second digit concerning for possible underlying cellulitis.  Quite tender to palpation.  Medical Decision Making  Medically screening exam initiated at 9:43 PM.  Appropriate orders placed.  Jordan [name] was informed that the remainder of the evaluation will be completed by another provider, this initial triage assessment does not replace that evaluation, and the importance of remaining in the ED until their evaluation is complete.     Tonye Pearson, [place] 12/06/21 2146

## 2021-12-07 NOTE — ED Notes (Signed)
Patient updated on status of wait time.

## 2021-12-07 NOTE — ED Notes (Signed)
Patient called for room. No response. Will recall.

## 2022-11-30 DEATH — deceased
